# Patient Record
Sex: Female | Born: 1955
Health system: Southern US, Community
[De-identification: ages and names within clinical notes are randomized; demographics above are authoritative.]

## PROBLEM LIST (undated history)

## (undated) DIAGNOSIS — Z972 Presence of dental prosthetic device (complete) (partial): Secondary | ICD-10-CM

## (undated) DIAGNOSIS — I6521 Occlusion and stenosis of right carotid artery: Secondary | ICD-10-CM

## (undated) DIAGNOSIS — K635 Polyp of colon: Secondary | ICD-10-CM

## (undated) DIAGNOSIS — G5600 Carpal tunnel syndrome, unspecified upper limb: Secondary | ICD-10-CM

## (undated) DIAGNOSIS — K259 Gastric ulcer, unspecified as acute or chronic, without hemorrhage or perforation: Secondary | ICD-10-CM

## (undated) DIAGNOSIS — K219 Gastro-esophageal reflux disease without esophagitis: Secondary | ICD-10-CM

## (undated) DIAGNOSIS — R42 Dizziness and giddiness: Secondary | ICD-10-CM

## (undated) DIAGNOSIS — M109 Gout, unspecified: Secondary | ICD-10-CM

## (undated) DIAGNOSIS — C4491 Basal cell carcinoma of skin, unspecified: Secondary | ICD-10-CM

## (undated) DIAGNOSIS — M1990 Unspecified osteoarthritis, unspecified site: Secondary | ICD-10-CM

## (undated) DIAGNOSIS — E785 Hyperlipidemia, unspecified: Secondary | ICD-10-CM

## (undated) DIAGNOSIS — G4730 Sleep apnea, unspecified: Secondary | ICD-10-CM

## (undated) DIAGNOSIS — E559 Vitamin D deficiency, unspecified: Secondary | ICD-10-CM

## (undated) DIAGNOSIS — R911 Solitary pulmonary nodule: Secondary | ICD-10-CM

## (undated) DIAGNOSIS — G4733 Obstructive sleep apnea (adult) (pediatric): Secondary | ICD-10-CM

## (undated) DIAGNOSIS — R0600 Dyspnea, unspecified: Secondary | ICD-10-CM

## (undated) DIAGNOSIS — E611 Iron deficiency: Secondary | ICD-10-CM

## (undated) DIAGNOSIS — C801 Malignant (primary) neoplasm, unspecified: Secondary | ICD-10-CM

## (undated) DIAGNOSIS — D509 Iron deficiency anemia, unspecified: Secondary | ICD-10-CM

## (undated) DIAGNOSIS — I10 Essential (primary) hypertension: Secondary | ICD-10-CM

## (undated) DIAGNOSIS — J45909 Unspecified asthma, uncomplicated: Secondary | ICD-10-CM

## (undated) DIAGNOSIS — T884XXA Failed or difficult intubation, initial encounter: Secondary | ICD-10-CM

## (undated) HISTORY — PX: APPENDECTOMY: SHX54

## (undated) HISTORY — PX: LUMBAR DISC SURGERY: SHX700

## (undated) HISTORY — PX: OTHER SURGICAL HISTORY: SHX169

## (undated) HISTORY — PX: COLONOSCOPY: SHX174

## (undated) HISTORY — PX: BACK SURGERY: SHX140

## (undated) HISTORY — PX: ESOPHAGOGASTRODUODENOSCOPY: SHX1529

## (undated) HISTORY — PX: FUNCTIONAL ENDOSCOPIC SINUS SURGERY: SUR616

## (undated) HISTORY — PX: ABDOMINAL HYSTERECTOMY: SHX81

## (undated) HISTORY — PX: TONSILLECTOMY AND ADENOIDECTOMY: SUR1326

## (undated) HISTORY — PX: JOINT REPLACEMENT: SHX530

## (undated) HISTORY — PX: CHOLECYSTECTOMY: SHX55

## (undated) HISTORY — PX: EYE SURGERY: SHX253

## (undated) HISTORY — PX: CARDIAC CATHETERIZATION: SHX172

---

## 1998-04-30 DIAGNOSIS — Z90710 Acquired absence of both cervix and uterus: Secondary | ICD-10-CM | POA: Insufficient documentation

## 2002-11-30 HISTORY — PX: KNEE ARTHROSCOPY: SHX127

## 2003-02-23 LAB — HM PAP SMEAR: HM Pap smear: NORMAL

## 2004-03-15 ENCOUNTER — Ambulatory Visit: Payer: Self-pay | Admitting: Unknown Physician Specialty

## 2004-05-28 ENCOUNTER — Emergency Department: Payer: Self-pay | Admitting: Unknown Physician Specialty

## 2004-08-16 ENCOUNTER — Ambulatory Visit: Payer: Self-pay | Admitting: Unknown Physician Specialty

## 2005-05-30 ENCOUNTER — Ambulatory Visit: Payer: Self-pay | Admitting: Unknown Physician Specialty

## 2005-08-08 ENCOUNTER — Ambulatory Visit: Payer: Self-pay | Admitting: Unknown Physician Specialty

## 2006-06-04 ENCOUNTER — Ambulatory Visit: Payer: Self-pay | Admitting: Unknown Physician Specialty

## 2007-06-06 ENCOUNTER — Ambulatory Visit: Payer: Self-pay | Admitting: Unknown Physician Specialty

## 2007-10-17 ENCOUNTER — Ambulatory Visit: Payer: Self-pay | Admitting: Unknown Physician Specialty

## 2008-06-03 DIAGNOSIS — G4730 Sleep apnea, unspecified: Secondary | ICD-10-CM | POA: Insufficient documentation

## 2008-06-03 DIAGNOSIS — E668 Other obesity: Secondary | ICD-10-CM | POA: Insufficient documentation

## 2008-06-03 DIAGNOSIS — I10 Essential (primary) hypertension: Secondary | ICD-10-CM | POA: Insufficient documentation

## 2008-06-08 ENCOUNTER — Ambulatory Visit: Payer: Self-pay | Admitting: Unknown Physician Specialty

## 2008-06-26 DIAGNOSIS — Z8249 Family history of ischemic heart disease and other diseases of the circulatory system: Secondary | ICD-10-CM | POA: Insufficient documentation

## 2008-06-26 DIAGNOSIS — Z87891 Personal history of nicotine dependence: Secondary | ICD-10-CM | POA: Insufficient documentation

## 2008-11-03 DIAGNOSIS — E785 Hyperlipidemia, unspecified: Secondary | ICD-10-CM | POA: Insufficient documentation

## 2008-11-10 ENCOUNTER — Ambulatory Visit: Payer: Self-pay | Admitting: Family Medicine

## 2008-12-14 ENCOUNTER — Ambulatory Visit: Payer: Self-pay | Admitting: Surgery

## 2009-03-09 DIAGNOSIS — H8110 Benign paroxysmal vertigo, unspecified ear: Secondary | ICD-10-CM | POA: Insufficient documentation

## 2009-06-14 ENCOUNTER — Ambulatory Visit: Payer: Self-pay | Admitting: Unknown Physician Specialty

## 2010-03-01 ENCOUNTER — Ambulatory Visit: Payer: Self-pay

## 2010-06-19 ENCOUNTER — Ambulatory Visit: Payer: Self-pay | Admitting: Unknown Physician Specialty

## 2011-06-25 ENCOUNTER — Ambulatory Visit: Payer: Self-pay | Admitting: Unknown Physician Specialty

## 2012-06-20 ENCOUNTER — Ambulatory Visit: Payer: Self-pay | Admitting: Unknown Physician Specialty

## 2012-12-03 ENCOUNTER — Ambulatory Visit: Payer: Self-pay | Admitting: Internal Medicine

## 2012-12-10 LAB — CBC CANCER CENTER
Basophil #: 0.1 x10 3/mm (ref 0.0–0.1)
HGB: 12.8 g/dL (ref 12.0–16.0)
Lymphocyte #: 1.9 x10 3/mm (ref 1.0–3.6)
MCH: 27.9 pg (ref 26.0–34.0)
Monocyte #: 0.5 x10 3/mm (ref 0.2–0.9)
Monocyte %: 5.3 %
Neutrophil #: 6.6 x10 3/mm — ABNORMAL HIGH (ref 1.4–6.5)
RBC: 4.6 10*6/uL (ref 3.80–5.20)
RDW: 15.6 % — ABNORMAL HIGH (ref 11.5–14.5)
WBC: 9.8 x10 3/mm (ref 3.6–11.0)

## 2012-12-10 LAB — IRON AND TIBC
Iron Saturation: 17 %
Iron: 53 ug/dL (ref 50–170)
Unbound Iron-Bind.Cap.: 259 ug/dL

## 2012-12-10 LAB — FERRITIN: Ferritin (ARMC): 66 ng/mL (ref 8–388)

## 2012-12-29 ENCOUNTER — Ambulatory Visit: Payer: Self-pay | Admitting: Internal Medicine

## 2013-02-06 ENCOUNTER — Ambulatory Visit: Payer: Self-pay | Admitting: Internal Medicine

## 2013-02-27 LAB — CBC CANCER CENTER
Basophil #: 0.1 x10 3/mm (ref 0.0–0.1)
Eosinophil #: 0.7 x10 3/mm (ref 0.0–0.7)
Eosinophil %: 5.7 %
HCT: 40.9 % (ref 35.0–47.0)
Lymphocyte #: 2.7 x10 3/mm (ref 1.0–3.6)
MCHC: 32.8 g/dL (ref 32.0–36.0)
MCV: 83 fL (ref 80–100)
Monocyte #: 0.7 x10 3/mm (ref 0.2–0.9)
Monocyte %: 5.4 %
Neutrophil #: 8.3 x10 3/mm — ABNORMAL HIGH (ref 1.4–6.5)
Platelet: 298 x10 3/mm (ref 150–440)
RBC: 4.97 10*6/uL (ref 3.80–5.20)
WBC: 12.5 x10 3/mm — ABNORMAL HIGH (ref 3.6–11.0)

## 2013-02-28 ENCOUNTER — Ambulatory Visit: Payer: Self-pay | Admitting: Internal Medicine

## 2013-03-10 LAB — OCCULT BLOOD X 1 CARD TO LAB, STOOL: Occult Blood, Feces: NEGATIVE

## 2013-03-30 ENCOUNTER — Ambulatory Visit: Payer: Self-pay | Admitting: Internal Medicine

## 2013-04-27 LAB — CBC CANCER CENTER
Basophil #: 0.1 x10 3/mm (ref 0.0–0.1)
Basophil %: 1.2 %
Eosinophil #: 0.8 x10 3/mm — ABNORMAL HIGH (ref 0.0–0.7)
Lymphocyte #: 3 x10 3/mm (ref 1.0–3.6)
MCH: 26.9 pg (ref 26.0–34.0)
Monocyte #: 0.7 x10 3/mm (ref 0.2–0.9)
Neutrophil %: 64.2 %
Platelet: 307 x10 3/mm (ref 150–440)
RDW: 15.3 % — ABNORMAL HIGH (ref 11.5–14.5)
WBC: 12.8 x10 3/mm — ABNORMAL HIGH (ref 3.6–11.0)

## 2013-04-30 ENCOUNTER — Ambulatory Visit: Payer: Self-pay | Admitting: Internal Medicine

## 2013-07-09 ENCOUNTER — Ambulatory Visit: Payer: Self-pay | Admitting: Internal Medicine

## 2013-07-10 LAB — CBC CANCER CENTER
BASOS ABS: 0.1 x10 3/mm (ref 0.0–0.1)
BASOS PCT: 1.2 %
EOS PCT: 6.6 %
Eosinophil #: 0.7 x10 3/mm (ref 0.0–0.7)
HCT: 38.2 % (ref 35.0–47.0)
HGB: 12.5 g/dL (ref 12.0–16.0)
Lymphocyte #: 2.4 x10 3/mm (ref 1.0–3.6)
Lymphocyte %: 21.4 %
MCH: 27.1 pg (ref 26.0–34.0)
MCHC: 32.7 g/dL (ref 32.0–36.0)
MCV: 83 fL (ref 80–100)
Monocyte #: 0.6 x10 3/mm (ref 0.2–0.9)
Monocyte %: 5.4 %
Neutrophil #: 7.3 x10 3/mm — ABNORMAL HIGH (ref 1.4–6.5)
Neutrophil %: 65.4 %
Platelet: 283 x10 3/mm (ref 150–440)
RBC: 4.61 10*6/uL (ref 3.80–5.20)
RDW: 15.7 % — ABNORMAL HIGH (ref 11.5–14.5)
WBC: 11.2 x10 3/mm — ABNORMAL HIGH (ref 3.6–11.0)

## 2013-07-29 ENCOUNTER — Ambulatory Visit: Payer: Self-pay | Admitting: Internal Medicine

## 2013-08-24 LAB — OCCULT BLOOD X 1 CARD TO LAB, STOOL
OCCULT BLOOD, FECES: POSITIVE
Occult Blood, Feces: NEGATIVE
Occult Blood, Feces: POSITIVE

## 2013-08-24 LAB — CBC CANCER CENTER
BASOS ABS: 0.1 x10 3/mm (ref 0.0–0.1)
Basophil %: 0.7 %
EOS PCT: 4.2 %
Eosinophil #: 0.5 x10 3/mm (ref 0.0–0.7)
HCT: 41.2 % (ref 35.0–47.0)
HGB: 13.6 g/dL (ref 12.0–16.0)
Lymphocyte #: 2.5 x10 3/mm (ref 1.0–3.6)
Lymphocyte %: 23.3 %
MCH: 27.3 pg (ref 26.0–34.0)
MCHC: 32.9 g/dL (ref 32.0–36.0)
MCV: 83 fL (ref 80–100)
Monocyte #: 0.5 x10 3/mm (ref 0.2–0.9)
Monocyte %: 4.8 %
NEUTROS ABS: 7.2 x10 3/mm — AB (ref 1.4–6.5)
Neutrophil %: 67 %
PLATELETS: 293 x10 3/mm (ref 150–440)
RBC: 4.97 10*6/uL (ref 3.80–5.20)
RDW: 14.3 % (ref 11.5–14.5)
WBC: 10.8 x10 3/mm (ref 3.6–11.0)

## 2013-08-28 ENCOUNTER — Ambulatory Visit: Payer: Self-pay | Admitting: Internal Medicine

## 2013-10-02 ENCOUNTER — Ambulatory Visit: Payer: Self-pay | Admitting: Internal Medicine

## 2013-10-23 ENCOUNTER — Ambulatory Visit: Payer: Self-pay | Admitting: Unknown Physician Specialty

## 2013-11-02 LAB — PATHOLOGY REPORT

## 2013-11-04 ENCOUNTER — Ambulatory Visit: Payer: Self-pay | Admitting: Internal Medicine

## 2013-11-04 LAB — CBC CANCER CENTER
BASOS PCT: 0.9 %
Basophil #: 0.1 x10 3/mm (ref 0.0–0.1)
EOS PCT: 4.7 %
Eosinophil #: 0.5 x10 3/mm (ref 0.0–0.7)
HCT: 41.5 % (ref 35.0–47.0)
HGB: 13.9 g/dL (ref 12.0–16.0)
LYMPHS ABS: 2.8 x10 3/mm (ref 1.0–3.6)
Lymphocyte %: 25.4 %
MCH: 27.6 pg (ref 26.0–34.0)
MCHC: 33.5 g/dL (ref 32.0–36.0)
MCV: 83 fL (ref 80–100)
MONOS PCT: 6.5 %
Monocyte #: 0.7 x10 3/mm (ref 0.2–0.9)
NEUTROS ABS: 7 x10 3/mm — AB (ref 1.4–6.5)
Neutrophil %: 62.5 %
Platelet: 276 x10 3/mm (ref 150–440)
RBC: 5.03 10*6/uL (ref 3.80–5.20)
RDW: 15.2 % — ABNORMAL HIGH (ref 11.5–14.5)
WBC: 11.1 x10 3/mm — AB (ref 3.6–11.0)

## 2013-11-28 ENCOUNTER — Ambulatory Visit: Payer: Self-pay | Admitting: Internal Medicine

## 2013-12-22 LAB — HEPATIC FUNCTION PANEL: BILIRUBIN, TOTAL: 0.5 mg/dL

## 2013-12-22 LAB — TSH: TSH: 4.18 u[IU]/mL (ref <=5.90)

## 2014-06-07 DIAGNOSIS — M19049 Primary osteoarthritis, unspecified hand: Secondary | ICD-10-CM | POA: Insufficient documentation

## 2014-06-22 LAB — CBC AND DIFFERENTIAL
HEMATOCRIT: 42 % (ref 36–46)
Hemoglobin: 14.3 g/dL (ref 12.0–16.0)
PLATELETS: 346 10*3/uL (ref 150–399)

## 2014-06-22 LAB — LIPID PANEL
Cholesterol: 232 mg/dL — AB (ref 0–200)
HDL: 38 mg/dL (ref 35–70)
LDL CALC: 163 mg/dL
LDl/HDL Ratio: 4.3
Triglycerides: 155 mg/dL (ref 40–160)

## 2014-06-22 LAB — BASIC METABOLIC PANEL
Creatinine: 1.4 mg/dL — AB (ref 0.5–1.1)
GLUCOSE: 98 mg/dL

## 2014-12-23 ENCOUNTER — Other Ambulatory Visit: Payer: Self-pay | Admitting: Family Medicine

## 2015-01-19 ENCOUNTER — Ambulatory Visit (INDEPENDENT_AMBULATORY_CARE_PROVIDER_SITE_OTHER): Payer: BLUE CROSS/BLUE SHIELD | Admitting: Family Medicine

## 2015-01-19 ENCOUNTER — Encounter: Payer: Self-pay | Admitting: Family Medicine

## 2015-01-19 VITALS — BP 126/68 | HR 72 | Temp 98.3°F | Resp 16 | Ht 65.0 in | Wt 271.0 lb

## 2015-01-19 DIAGNOSIS — K229 Disease of esophagus, unspecified: Secondary | ICD-10-CM | POA: Insufficient documentation

## 2015-01-19 DIAGNOSIS — R0600 Dyspnea, unspecified: Secondary | ICD-10-CM | POA: Insufficient documentation

## 2015-01-19 DIAGNOSIS — E8881 Metabolic syndrome: Secondary | ICD-10-CM | POA: Insufficient documentation

## 2015-01-19 DIAGNOSIS — K259 Gastric ulcer, unspecified as acute or chronic, without hemorrhage or perforation: Secondary | ICD-10-CM | POA: Insufficient documentation

## 2015-01-19 DIAGNOSIS — D649 Anemia, unspecified: Secondary | ICD-10-CM | POA: Insufficient documentation

## 2015-01-19 DIAGNOSIS — K2100 Gastro-esophageal reflux disease with esophagitis, without bleeding: Secondary | ICD-10-CM | POA: Insufficient documentation

## 2015-01-19 DIAGNOSIS — J309 Allergic rhinitis, unspecified: Secondary | ICD-10-CM | POA: Insufficient documentation

## 2015-01-19 DIAGNOSIS — D509 Iron deficiency anemia, unspecified: Secondary | ICD-10-CM | POA: Insufficient documentation

## 2015-01-19 DIAGNOSIS — J321 Chronic frontal sinusitis: Secondary | ICD-10-CM | POA: Insufficient documentation

## 2015-01-19 DIAGNOSIS — E538 Deficiency of other specified B group vitamins: Secondary | ICD-10-CM | POA: Insufficient documentation

## 2015-01-19 DIAGNOSIS — R1011 Right upper quadrant pain: Secondary | ICD-10-CM

## 2015-01-19 DIAGNOSIS — D126 Benign neoplasm of colon, unspecified: Secondary | ICD-10-CM | POA: Insufficient documentation

## 2015-01-19 DIAGNOSIS — L509 Urticaria, unspecified: Secondary | ICD-10-CM | POA: Insufficient documentation

## 2015-01-19 DIAGNOSIS — I10 Essential (primary) hypertension: Secondary | ICD-10-CM | POA: Insufficient documentation

## 2015-01-19 DIAGNOSIS — K635 Polyp of colon: Secondary | ICD-10-CM | POA: Insufficient documentation

## 2015-01-19 DIAGNOSIS — S9000XA Contusion of unspecified ankle, initial encounter: Secondary | ICD-10-CM | POA: Insufficient documentation

## 2015-01-19 DIAGNOSIS — E559 Vitamin D deficiency, unspecified: Secondary | ICD-10-CM | POA: Insufficient documentation

## 2015-01-19 DIAGNOSIS — R609 Edema, unspecified: Secondary | ICD-10-CM | POA: Insufficient documentation

## 2015-01-19 DIAGNOSIS — N289 Disorder of kidney and ureter, unspecified: Secondary | ICD-10-CM | POA: Insufficient documentation

## 2015-01-19 DIAGNOSIS — M542 Cervicalgia: Secondary | ICD-10-CM | POA: Insufficient documentation

## 2015-01-19 MED ORDER — VALACYCLOVIR HCL 1 G PO TABS: 1000.0000 mg | ORAL_TABLET | Freq: Three times a day (TID) | ORAL | Status: DC

## 2015-01-19 NOTE — Progress Notes (Signed)
Patient ID: Sarah Marquez, female   DOB: 05/19/1955, 59 y.o.   MRN: [id number]       Patient: Sarah Marquez Female    DOB: 03-29-1956   59 y.o.   MRN: [id number] Visit Date: 01/19/2015  Today's Provider: Wilhemena Durie, MD   Chief Complaint  Patient presents with  . Abdominal Pain    X 3 days more in RUQ.    Subjective:    Abdominal Pain This is a new problem. The current episode started in the past 7 days. The onset quality is gradual. The problem occurs constantly. The problem has been gradually worsening. The pain is located in the RUQ. The pain is at a severity of 4/10. The pain is mild. The quality of the pain is burning and dull. The abdominal pain radiates to the back. Associated symptoms include diarrhea and nausea. Pertinent negatives include no fever, hematochezia or hematuria. Nothing aggravates the pain. The pain is relieved by nothing. She has tried antacids for the symptoms. The treatment provided no relief. Her past medical history is significant for abdominal surgery. Patient has had her gallbladder removed.    Patient reports that after she has had symptoms after Sunday dinner. Patient reports that initially she had diarrhea, but now she reports that it has resolved. Patient reports that it is a burning sensation in her RUQ that radiates to her back. Patient reports that she takes Dexilant on a daily basis and it usually helps with indigestion, but no so much this time.      Allergies  Allergen Reactions  . Beclomethasone   . Sulfa Antibiotics   . Latex Rash   Previous Medications   CHOLECALCIFEROL (VITAMIN D-1000 MAX ST) 1000 UNITS TABLET    Take by mouth.   FERROUS SULFATE 325 (65 FE) MG TABLET    Take by mouth.   MAGNESIUM 200 MG TABS    Take by mouth.   OMEPRAZOLE (PRILOSEC) 40 MG CAPSULE    TAKE 1 CAPSULE TWICE A DAY   VERAPAMIL (CALAN-SR) 240 MG CR TABLET    TAKE 1 TABLET BY MOUTH EVERY DAY    Review of Systems  Constitutional: Negative for fever.    HENT: Negative.   Eyes: Negative.   Gastrointestinal: Positive for nausea, abdominal pain and diarrhea. Negative for hematochezia.  Genitourinary: Negative.  Negative for hematuria.  Neurological: Negative.     Social History  Substance Use Topics  . Smoking status: Never Smoker   . Smokeless tobacco: Not on file  . Alcohol Use: No   Objective:   BP 126/68 mmHg  Pulse 72  Temp(Src) 98.3 F (36.8 C)  Resp 16  Ht 5\' 5"  (1.651 m)  Wt 271 lb (122.925 kg)  BMI 45.10 kg/m2  Physical Exam  Constitutional: She is oriented to person, place, and time. She appears well-developed and well-nourished.  HENT:  Head: Normocephalic and atraumatic.  Right Ear: External ear normal.  Left Ear: External ear normal.  Mouth/Throat: Oropharynx is clear and moist.  Eyes: Conjunctivae are normal.  Neck: Neck supple.  Cardiovascular: Normal rate and normal heart sounds.   Pulmonary/Chest: Effort normal and breath sounds normal.  Abdominal: Soft.  Neurological: She is alert and oriented to person, place, and time.  Skin: Skin is warm and dry.  Psychiatric: She has a normal mood and affect. Her behavior is normal. Judgment and thought content normal.        Assessment & Plan:     1. Right upper  quadrant pain  - CBC with Differential - Comprehensive Metabolic Panel (CMET) Pt s/p cholecystectomy. Could be shingles but cannot make dx without rash,RTC next week if still in pain.      Richard Cranford Mon, MD  [facility]

## 2015-01-20 LAB — CBC WITH DIFFERENTIAL/PLATELET
BASOS ABS: 0 10*3/uL (ref 0.0–0.2)
Basos: 0 %
EOS (ABSOLUTE): 0.6 10*3/uL — AB (ref 0.0–0.4)
Eos: 5 %
HEMOGLOBIN: 14.6 g/dL (ref 11.1–15.9)
Hematocrit: 44.1 % (ref 34.0–46.6)
IMMATURE GRANS (ABS): 0 10*3/uL (ref 0.0–0.1)
Immature Granulocytes: 0 %
LYMPHS: 26 %
Lymphocytes Absolute: 2.9 10*3/uL (ref 0.7–3.1)
MCH: 28.9 pg (ref 26.6–33.0)
MCHC: 33.1 g/dL (ref 31.5–35.7)
MCV: 87 fL (ref 79–97)
Monocytes Absolute: 0.9 10*3/uL (ref 0.1–0.9)
Monocytes: 8 %
Neutrophils Absolute: 7 10*3/uL (ref 1.4–7.0)
Neutrophils: 61 %
PLATELETS: 322 10*3/uL (ref 150–379)
RBC: 5.06 x10E6/uL (ref 3.77–5.28)
RDW: 14.4 % (ref 12.3–15.4)
WBC: 11.4 10*3/uL — AB (ref 3.4–10.8)

## 2015-01-20 LAB — COMPREHENSIVE METABOLIC PANEL
ALBUMIN: 4.1 g/dL (ref 3.6–4.8)
ALK PHOS: 110 IU/L (ref 39–117)
ALT: 13 IU/L (ref 0–32)
AST: 16 IU/L (ref 0–40)
Albumin/Globulin Ratio: 1.4 (ref 1.1–2.5)
BILIRUBIN TOTAL: 0.5 mg/dL (ref 0.0–1.2)
BUN / CREAT RATIO: 17 (ref 11–26)
BUN: 18 mg/dL (ref 8–27)
CHLORIDE: 98 mmol/L (ref 97–108)
CO2: 26 mmol/L (ref 18–29)
CREATININE: 1.06 mg/dL — AB (ref 0.57–1.00)
Calcium: 9.7 mg/dL (ref 8.7–10.3)
GFR calc Af Amer: 66 mL/min/{1.73_m2} (ref >59)
GFR calc non Af Amer: 57 mL/min/{1.73_m2} — ABNORMAL LOW (ref >59)
GLUCOSE: 88 mg/dL (ref 65–99)
Globulin, Total: 3 g/dL (ref 1.5–4.5)
Potassium: 4.8 mmol/L (ref 3.5–5.2)
Sodium: 140 mmol/L (ref 134–144)
TOTAL PROTEIN: 7.1 g/dL (ref 6.0–8.5)

## 2015-01-24 ENCOUNTER — Telehealth: Payer: Self-pay

## 2015-01-24 NOTE — Telephone Encounter (Signed)
-----   Message from Jerrol Banana., MD sent at 01/23/2015  9:06 AM EDT ----- Labs stable

## 2015-01-24 NOTE — Telephone Encounter (Signed)
Advised  ED 

## 2015-02-16 ENCOUNTER — Encounter: Payer: Self-pay | Admitting: Family Medicine

## 2015-02-25 ENCOUNTER — Other Ambulatory Visit: Payer: Self-pay

## 2015-02-25 MED ORDER — LOSARTAN POTASSIUM 50 MG PO TABS: 50.0000 mg | ORAL_TABLET | Freq: Every day | ORAL | Status: DC

## 2015-04-29 ENCOUNTER — Other Ambulatory Visit: Payer: Self-pay | Admitting: Family Medicine

## 2015-05-19 ENCOUNTER — Encounter: Payer: Self-pay | Admitting: Emergency Medicine

## 2015-06-20 ENCOUNTER — Other Ambulatory Visit: Payer: Self-pay | Admitting: Family Medicine

## 2015-07-05 ENCOUNTER — Other Ambulatory Visit: Payer: Self-pay | Admitting: Family Medicine

## 2015-07-25 ENCOUNTER — Telehealth: Payer: Self-pay

## 2015-07-25 NOTE — Telephone Encounter (Signed)
lmtcb-need to know date of her last mammogram to update her chart=aa

## 2015-07-26 NOTE — Telephone Encounter (Signed)
Pt returned call. Pt stated she had her last mammogram done last March and plans to contact [place] [facility] to schedule one for this year. Thanks TNP

## 2015-07-26 NOTE — Telephone Encounter (Signed)
Thanks updated in the chart-aa

## 2015-08-15 DIAGNOSIS — J208 Acute bronchitis due to other specified organisms: Secondary | ICD-10-CM | POA: Diagnosis not present

## 2015-08-23 DIAGNOSIS — Z01419 Encounter for gynecological examination (general) (routine) without abnormal findings: Secondary | ICD-10-CM | POA: Diagnosis not present

## 2015-08-23 DIAGNOSIS — Z1231 Encounter for screening mammogram for malignant neoplasm of breast: Secondary | ICD-10-CM | POA: Diagnosis not present

## 2015-08-28 DIAGNOSIS — J208 Acute bronchitis due to other specified organisms: Secondary | ICD-10-CM | POA: Diagnosis not present

## 2015-09-08 DIAGNOSIS — J209 Acute bronchitis, unspecified: Secondary | ICD-10-CM | POA: Diagnosis not present

## 2015-09-21 DIAGNOSIS — M170 Bilateral primary osteoarthritis of knee: Secondary | ICD-10-CM | POA: Diagnosis not present

## 2015-09-28 ENCOUNTER — Encounter: Payer: Self-pay | Admitting: Family Medicine

## 2015-10-06 ENCOUNTER — Ambulatory Visit (INDEPENDENT_AMBULATORY_CARE_PROVIDER_SITE_OTHER): Payer: BLUE CROSS/BLUE SHIELD | Admitting: Family Medicine

## 2015-10-06 ENCOUNTER — Encounter: Payer: Self-pay | Admitting: Family Medicine

## 2015-10-06 VITALS — BP 124/70 | HR 92 | Temp 97.6°F | Resp 20 | Wt 287.0 lb

## 2015-10-06 DIAGNOSIS — D509 Iron deficiency anemia, unspecified: Secondary | ICD-10-CM

## 2015-10-06 DIAGNOSIS — E538 Deficiency of other specified B group vitamins: Secondary | ICD-10-CM

## 2015-10-06 DIAGNOSIS — I10 Essential (primary) hypertension: Secondary | ICD-10-CM | POA: Diagnosis not present

## 2015-10-06 DIAGNOSIS — E785 Hyperlipidemia, unspecified: Secondary | ICD-10-CM

## 2015-10-06 DIAGNOSIS — L03119 Cellulitis of unspecified part of limb: Secondary | ICD-10-CM

## 2015-10-06 MED ORDER — AMOXICILLIN 500 MG PO CAPS: 500.0000 mg | ORAL_CAPSULE | Freq: Three times a day (TID) | ORAL | Status: DC

## 2015-10-06 NOTE — Progress Notes (Signed)
Subjective:    Patient ID: Sarah Marquez, female    DOB: 03/26/1956, 60 y.o.   MRN: [id number]  HPI Cellulitis: Patient complains of cellulitis. The location of the cellulitis is lower leg(s) bilateral. Onset of symptoms was 1 month ago, waxing and waning since that time. Pt has noticed the problem gradually worsening over the last week. Associated sx include redness, edema, burning and itching sensation.    Review of Systems  Constitutional: Positive for unexpected weight change (gain). Negative for fever, chills, diaphoresis, activity change, appetite change and fatigue.  HENT: Negative.   Eyes: Negative.   Respiratory: Positive for cough. Negative for shortness of breath and wheezing.   Cardiovascular: Positive for leg swelling. Negative for chest pain and palpitations.  Skin:       Erythema present on bilateral lower extremities.  Allergic/Immunologic: Negative.   Neurological: Negative.   Hematological: Negative.    BP 124/70 mmHg  Pulse 92  Temp(Src) 97.6 F (36.4 C) (Oral)  Resp 20  Wt 287 lb (130.182 kg)   Patient Active Problem List   Diagnosis Date Noted  . Benign neoplasm of colon 01/19/2015  . Allergic rhinitis 01/19/2015  . Absolute anemia 01/19/2015  . Colon polyp 01/19/2015  . Ankle bruise 01/19/2015  . Dysmetabolic syndrome X [id number]  . Disorder of esophagus 01/19/2015  . Edema 01/19/2015  . Esophagitis, reflux 01/19/2015  . Frontal sinusitis 01/19/2015  . BP (high blood pressure) 01/19/2015  . Anemia, iron deficiency 01/19/2015  . Iron deficiency anemia 01/19/2015  . Abnormal kidney function 01/19/2015  . Cervical pain 01/19/2015  . Dyspnea, paroxysmal nocturnal 01/19/2015  . Gastric ulcer 01/19/2015  . Hive 01/19/2015  . B12 deficiency 01/19/2015  . Vitamin D deficiency 01/19/2015  . Degenerative arthritis of finger 06/07/2014  . Benign paroxysmal positional nystagmus 03/09/2009  . HLD (hyperlipidemia) 11/03/2008  . Fam hx-ischem heart  disease 06/26/2008  . History of tobacco use 06/26/2008  . Essential (primary) hypertension 06/03/2008  . Extreme obesity ([place]) 06/03/2008  . Apnea, sleep 06/03/2008  . Morbid obesity ([place]) 06/03/2008  . H/O total hysterectomy 04/30/1998   No past medical history on file. Current Outpatient Prescriptions on File Prior to Visit  Medication Sig  . Cholecalciferol (VITAMIN D-1000 MAX ST) 1000 UNITS tablet Take by mouth.  . DEXILANT 60 MG capsule Take 1 capsule by mouth daily.  . ferrous sulfate 325 (65 FE) MG tablet TAKE 1 TABLET EVERY DAY  . losartan (COZAAR) 50 MG tablet TAKE 1 TABLET BY MOUTH DAILY  . Magnesium 200 MG TABS Take by mouth.  . verapamil (CALAN-SR) 240 MG CR tablet TAKE 1 TABLET BY MOUTH EVERY DAY   No current facility-administered medications on file prior to visit.   Allergies  Allergen Reactions  . Beclomethasone   . Sulfa Antibiotics   . Latex Rash  . Meloxicam Palpitations   Past Surgical History  Procedure Laterality Date  . Appendectomy    . Abdominal hysterectomy    . Lumbar disc surgery      ruptured disc x 2  . Tonsillectomy and adenoidectomy    . Cardiac catheterization     Social History   Social History  . Marital Status: Married    Spouse Name: N/A  . Number of Children: N/A  . Years of Education: N/A   Occupational History  . Not on file.   Social History Main Topics  . Smoking status: Never Smoker   . Smokeless tobacco: Not on file  .  Alcohol Use: No  . Drug Use: No  . Sexual Activity: Not on file   Other Topics Concern  . Not on file   Social History Narrative   Family History  Problem Relation Age of Onset  . Breast cancer Mother   . Heart attack Father   . Heart attack Brother       Objective:   Physical Exam  Constitutional: She is oriented to person, place, and time. She appears well-developed and well-nourished.  HENT:  Head: Normocephalic and atraumatic.  Right Ear: External ear normal.  Left Ear: External ear  normal.  Nose: Nose normal.  Eyes: Conjunctivae are normal.  Neck: Neck supple.  Cardiovascular: Normal rate, regular rhythm and normal heart sounds.   Pulmonary/Chest: Effort normal and breath sounds normal.  Abdominal: Soft.  Neurological: She is alert and oriented to person, place, and time.  Skin: Skin is warm and dry.  Psychiatric: She has a normal mood and affect. Her behavior is normal. Judgment and thought content normal.  Edema both lower extremities, right greater than left. There is erythematous of the right lower extremity of the foot and ankle  With erythema without induration of lower half of right lower leg.        Assessment & Plan:  1. Cellulitis of lower extremity, unspecified laterality Right leg - amoxicillin (AMOXIL) 500 MG capsule; Take 1 capsule (500 mg total) by mouth 3 (three) times daily.  Dispense: 28 capsule; Refill: 0More than 50% of this visit spent in counseling regarding these issues.  2. Essential (primary) hypertension  - CBC with Differential/Platelet - TSH  3. B12 deficiency   4. Anemia, iron deficiency   5. HLD (hyperlipidemia)  - Lipid Panel With LDL/HDL Ratio - Comprehensive metabolic panel 6.Morbid Obesity

## 2015-10-07 LAB — COMPREHENSIVE METABOLIC PANEL
ALK PHOS: 116 IU/L (ref 39–117)
ALT: 12 IU/L (ref 0–32)
AST: 15 IU/L (ref 0–40)
Albumin/Globulin Ratio: 1.4 (ref 1.2–2.2)
Albumin: 3.8 g/dL (ref 3.6–4.8)
BUN/Creatinine Ratio: 20 (ref 12–28)
BUN: 18 mg/dL (ref 8–27)
Bilirubin Total: 0.6 mg/dL (ref 0.0–1.2)
CALCIUM: 9.3 mg/dL (ref 8.7–10.3)
CO2: 25 mmol/L (ref 18–29)
CREATININE: 0.9 mg/dL (ref 0.57–1.00)
Chloride: 101 mmol/L (ref 96–106)
GFR calc Af Amer: 80 mL/min/{1.73_m2} (ref >59)
GFR, EST NON AFRICAN AMERICAN: 69 mL/min/{1.73_m2} (ref >59)
GLOBULIN, TOTAL: 2.7 g/dL (ref 1.5–4.5)
Glucose: 95 mg/dL (ref 65–99)
Potassium: 4.6 mmol/L (ref 3.5–5.2)
SODIUM: 142 mmol/L (ref 134–144)
Total Protein: 6.5 g/dL (ref 6.0–8.5)

## 2015-10-07 LAB — CBC WITH DIFFERENTIAL/PLATELET
BASOS: 0 %
Basophils Absolute: 0 10*3/uL (ref 0.0–0.2)
EOS (ABSOLUTE): 0.6 10*3/uL — ABNORMAL HIGH (ref 0.0–0.4)
EOS: 7 %
HEMATOCRIT: 40.7 % (ref 34.0–46.6)
Hemoglobin: 13.7 g/dL (ref 11.1–15.9)
IMMATURE GRANS (ABS): 0 10*3/uL (ref 0.0–0.1)
IMMATURE GRANULOCYTES: 0 %
LYMPHS: 23 %
Lymphocytes Absolute: 2 10*3/uL (ref 0.7–3.1)
MCH: 28.7 pg (ref 26.6–33.0)
MCHC: 33.7 g/dL (ref 31.5–35.7)
MCV: 85 fL (ref 79–97)
MONOCYTES: 6 %
Monocytes Absolute: 0.5 10*3/uL (ref 0.1–0.9)
NEUTROS PCT: 64 %
Neutrophils Absolute: 5.7 10*3/uL (ref 1.4–7.0)
Platelets: 284 10*3/uL (ref 150–379)
RBC: 4.77 x10E6/uL (ref 3.77–5.28)
RDW: 14.5 % (ref 12.3–15.4)
WBC: 8.8 10*3/uL (ref 3.4–10.8)

## 2015-10-07 LAB — LIPID PANEL WITH LDL/HDL RATIO
Cholesterol, Total: 210 mg/dL — ABNORMAL HIGH (ref 100–199)
HDL: 32 mg/dL — AB (ref >39)
LDL Calculated: 145 mg/dL — ABNORMAL HIGH (ref 0–99)
LDL/HDL RATIO: 4.5 ratio — AB (ref 0.0–3.2)
TRIGLYCERIDES: 163 mg/dL — AB (ref 0–149)
VLDL CHOLESTEROL CAL: 33 mg/dL (ref 5–40)

## 2015-10-07 LAB — TSH: TSH: 2.37 u[IU]/mL (ref 0.450–4.500)

## 2015-11-08 ENCOUNTER — Other Ambulatory Visit: Payer: Self-pay | Admitting: Unknown Physician Specialty

## 2015-11-08 ENCOUNTER — Ambulatory Visit
Admission: RE | Admit: 2015-11-08 | Discharge: 2015-11-08 | Disposition: A | Discharge status: Home / Self Care | Payer: BLUE CROSS/BLUE SHIELD | Source: Ambulatory Visit | Attending: Unknown Physician Specialty | Admitting: Unknown Physician Specialty

## 2015-11-08 DIAGNOSIS — J849 Interstitial pulmonary disease, unspecified: Secondary | ICD-10-CM | POA: Diagnosis not present

## 2015-11-08 DIAGNOSIS — R0600 Dyspnea, unspecified: Secondary | ICD-10-CM | POA: Diagnosis not present

## 2015-11-08 DIAGNOSIS — R059 Cough, unspecified: Secondary | ICD-10-CM

## 2015-11-08 DIAGNOSIS — R05 Cough: Secondary | ICD-10-CM

## 2015-11-08 DIAGNOSIS — J45991 Cough variant asthma: Secondary | ICD-10-CM | POA: Diagnosis not present

## 2015-11-10 ENCOUNTER — Encounter: Payer: Self-pay | Admitting: Internal Medicine

## 2015-11-10 ENCOUNTER — Ambulatory Visit (INDEPENDENT_AMBULATORY_CARE_PROVIDER_SITE_OTHER): Payer: BLUE CROSS/BLUE SHIELD | Admitting: Internal Medicine

## 2015-11-10 VITALS — BP 126/68 | HR 67 | Ht 65.0 in | Wt 287.0 lb

## 2015-11-10 DIAGNOSIS — J069 Acute upper respiratory infection, unspecified: Secondary | ICD-10-CM | POA: Diagnosis not present

## 2015-11-10 DIAGNOSIS — G4733 Obstructive sleep apnea (adult) (pediatric): Secondary | ICD-10-CM | POA: Insufficient documentation

## 2015-11-10 DIAGNOSIS — R059 Cough, unspecified: Secondary | ICD-10-CM

## 2015-11-10 DIAGNOSIS — R0600 Dyspnea, unspecified: Secondary | ICD-10-CM

## 2015-11-10 DIAGNOSIS — R05 Cough: Secondary | ICD-10-CM

## 2015-11-10 MED ORDER — MONTELUKAST SODIUM 10 MG PO TABS: 10.0000 mg | ORAL_TABLET | Freq: Every day | ORAL | Status: DC

## 2015-11-10 NOTE — Progress Notes (Signed)
[facility] Consultation    Date: 11/10/2015  MRN# [id number] Sarah Marquez 1955/07/14  Referring Physician: Dr. Tami Ribas PMD - Dr. Rosanna Randy Sarah Marquez is  60 y.o. old female seen in consultation for chronic cough along with abnormal chest x-ray findings.  CC:  Chief Complaint  Patient presents with  . pulmonary consult    pt ref by Dr. Tami Ribas. pt states she seen Dr. Tami Ribas 08/2015 for cough started abx, prednisone & tessalon cough improved for 2 wk. currently has non prod cough, wheezing, sob w/exertion X61mo.     HPI:  60 year old female referred from ENT for evaluation of cough. Cough is described as felt mostly in the chest, has episodes of bronchitis, most recently in March. Has had similar episodes in the last year, had negative allergy testing along with close follow-up by ENT. Given the negative allergy testing her Allegra was stopped. Follows with ENT for chronic sinusitis. Cough is described as nonproductive, mild intermittent nighttime waking's associated with it, along with mild shortness of breath. States that she does also experience shortness of breath with exertion, can walk about 50 yards before eating to take a break. Review of records show that patient has follow-up with ENT most recently on 11/08/2015; who noted that cough is been present for the 2 weeks prior, associated with wheezing and shortness of breath cough developed gradually, has had interventions with pro-air sick contacts include husband, plan at that time was initiation of Qvar 80 g 2 puffs twice a day along with rescue inhaler and a chest x-ray. Chest x-ray showed no acute abnormalities in stable mild chronic interstitial lung disease, necessitating referral to pulmonary. 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 Previously was a social smoker as a teenager, he does have secondhand exposure from her  family, social drinker. Line review of records that show on 09/08/2015 patient didn't follow-up with ENT due to a acute bronchitis episode, was given prednisone Dosepak along with Levaquin and albuterol inhaler. At that time she was experiencing cough with sputum production and shortness of breath. She also with a known history of obstructive sleep apnea, diagnosed about 15 years ago, used to machine at that time, but gradually stop using it. Patient states that about 15 years that she's actually use her machine consistently. Could not recall what her CPAP severity or CPAP machine settings were.  PMHX:   History reviewed. No pertinent past medical history. Surgical Hx:  Past Surgical History  Procedure Laterality Date  . Appendectomy    . Abdominal hysterectomy    . Lumbar disc surgery      ruptured disc x 2  . Tonsillectomy and adenoidectomy    . Cardiac catheterization     Family Hx:  Family History  Problem Relation Age of Onset  . Breast cancer Mother   . Heart attack Father   . Heart attack Brother    Social Hx:   Social History  Substance Use Topics  . Smoking status: Never Smoker   . Smokeless tobacco: None  . Alcohol Use: No   Medication:   Current Outpatient Rx  Name  Route  Sig  Dispense  Refill  . albuterol (PROAIR HFA) 108 (90 Base) MCG/ACT inhaler   Inhalation   Inhale into the lungs.         Marland Kitchen amoxicillin (AMOXIL) 500 MG capsule   Oral   Take 1 capsule (500 mg total) by mouth 3 (  three) times daily.   28 capsule   0   . benzonatate (TESSALON) 200 MG capsule   Oral   Take by mouth.         . Cholecalciferol (VITAMIN D-1000 MAX ST) 1000 UNITS tablet   Oral   Take by mouth.         . DEXILANT 60 MG capsule   Oral   Take 1 capsule by mouth daily.      11     Dispense as written.   . ferrous sulfate 325 (65 FE) MG tablet      TAKE 1 TABLET EVERY DAY   90 tablet   3   . losartan (COZAAR) 50 MG tablet      TAKE 1 TABLET BY MOUTH DAILY    30 tablet   9   . Magnesium 200 MG TABS   Oral   Take by mouth.         . verapamil (CALAN-SR) 240 MG CR tablet      TAKE 1 TABLET BY MOUTH EVERY DAY   90 tablet   3       Allergies:  Beclomethasone; Sulfa antibiotics; Latex; and Meloxicam  ROS   Physical Examination:   VS: BP 126/68 mmHg  Pulse 67  Ht 5\' 5"  (1.651 m)  Wt 287 lb (130.182 kg)  BMI 47.76 kg/m2  SpO2 98%  General Appearance: No distress  Neuro:without focal findings, mental status, speech normal, alert and oriented, cranial nerves 2-12 intact, reflexes normal and symmetric, sensation grossly normal  HEENT: PERRLA, EOM intact, no ptosis, no other lesions noticed; Mallampati 3 Pulmonary: normal breath sounds., diaphragmatic excursion normal.No wheezing, No rales;   Sputum Production:  none CardiovascularNormal S1,S2.  No m/r/g.  Abdominal aorta pulsation normal.    Abdomen: Benign, Soft, non-tender, No masses, hepatosplenomegaly, No lymphadenopathy Renal:  No costovertebral tenderness  GU:  No performed at this time. Endoc: No evident thyromegaly, no signs of acromegaly or Cushing features Skin:   warm, no rashes, no ecchymosis  Extremities: normal, no cyanosis, clubbing, no edema, warm with normal capillary refill. Other findings:none   Labs results:   Rad results: (The following images and results were reviewed by Dr. Stevenson Clinch on 11/10/2015). CXR 11/08/15 CLINICAL DATA: Wheezing and dyspnea for the past 2 months.  EXAM: CHEST 2 VIEW  COMPARISON: 03/01/2010.  FINDINGS: A poor inspiration is again demonstrated. The heart remains grossly normal in size. The interstitial markings remain mildly prominent. Otherwise, clear lungs with normal vascularity. Mild thoracic spine degenerative changes. Cholecystectomy clips.  IMPRESSION: No acute abnormality. Stable mild chronic interstitial lung disease.   Assessment and Plan:45 -year-old female past medical history of chronic sinusitis, sleep  apnea, now seen in consultation for chronic cough/shortness of breath. OSA (obstructive sleep apnea) Patient with known history of sleep apnea, no use of machine in the past 15 years.  Plan: Split night study  Recurrent URI (upper respiratory infection) Patient with recurrent upper respiratory tract infections. She does have a history of chronic sinusitis, followed closely by ENT, recently treated with prednisone and Levaquin. At this time optimization of possibly adult-onset asthma has been initiated by ENT with initiation of Qvar. Given that she has allergic type symptoms, we'll start her on Singulair. Blood allergy testing was negative and her Allegra was stopped. However, in certain patients with predisposition to asthma, singular can be helpful.  Plan: -Pulmonary function testing and 6 minute walk test prior to follow-up visit -Continue with Qvar, 80 g,  2 puffs twice a day, -Continue pro-air as needed -Singular-10 mg 1 tab by mouth daily   Morbid obesity ([place]) OBESITY  Discussed importance of weight reduction.  Educated regarding limitation of  intake of greasy/fried foods.  Instructed on benefit of  a low-impact exercise program, starting slowly.  Discussed benefits of 30-45 minutes of some form of exercise daily as well as benefit of supervised exercise program.     Cough Differential includes: Chronic sinusitis, uncontrolled sleep apnea, adult-onset asthma, possible non-eosinophilic allergic asthma  Start singular 10 mg 1 tab by mouth daily Pulmonary function testing Split-night study  Dyspnea Patient with dyspnea. At this time is multifactorial morbid obesity, uncontrolled sleep apnea, deconditioning. I have reviewed her chest x-ray there is some mild interstitial lung changes that are noted, however classified this is chronic interstitial lung diseases difficult at this time. She does not have any significant exposure to 8 dust, exotic birds, other factors. I  suspect the chest x-ray findings are a sequelae of her recent recurrent bronchitis episodes. We'll optimize OSA, evaluate for asthma, and if after these measures, there is still significant dyspnea, will further evaluate lung architecture with high-resolution CAT scan.  Plan: -Optimize OSA treatment with split-night study -Singulair 10 mg daily -Weight loss, diet, exercise -Pulmonary function testing and 6 minute walk test.    Updated Medication List Outpatient Encounter Prescriptions as of 11/10/2015  Medication Sig  . albuterol (PROAIR HFA) 108 (90 Base) MCG/ACT inhaler Inhale into the lungs.  Marland Kitchen amoxicillin (AMOXIL) 500 MG capsule Take 1 capsule (500 mg total) by mouth 3 (three) times daily.  . benzonatate (TESSALON) 200 MG capsule Take by mouth.  . Cholecalciferol (VITAMIN D-1000 MAX ST) 1000 UNITS tablet Take by mouth.  . DEXILANT 60 MG capsule Take 1 capsule by mouth daily.  . ferrous sulfate 325 (65 FE) MG tablet TAKE 1 TABLET EVERY DAY  . losartan (COZAAR) 50 MG tablet TAKE 1 TABLET BY MOUTH DAILY  . Magnesium 200 MG TABS Take by mouth.  . verapamil (CALAN-SR) 240 MG CR tablet TAKE 1 TABLET BY MOUTH EVERY DAY   No facility-administered encounter medications on file as of 11/10/2015.    Orders for this visit: Orders Placed This Encounter  Procedures  . Pulmonary function test    Standing Status: Future     Number of Occurrences:      Standing Expiration Date: 11/09/2016    Order Specific Question:  Where should this test be performed?    Answer:  [facility]    Order Specific Question:  Full PFT: includes the following: basic spirometry, spirometry pre & post bronchodilator, diffusion capacity (DLCO), lung volumes    Answer:  Full PFT  . 6 minute walk    Standing Status: Future     Number of Occurrences:      Standing Expiration Date: 11/09/2016    Order Specific Question:  Where should this test be performed?    Answer:  Other  . Split night study    Standing  Status: Future     Number of Occurrences:      Standing Expiration Date: 11/09/2016    Order Specific Question:  Where should this test be performed:    Answer:  Other     Thank  you for the consultation and for allowing [facility] to assist in the care of your patient. Our recommendations are noted above.  Please contact us if we can be of further service.   Tilla Wilborn,  MD [facility] Office Number: [phone]  Note: This note was prepared with Dragon dictation along with smaller phrase technology. Any transcriptional errors that result from this process are unintentional.

## 2015-11-10 NOTE — Patient Instructions (Signed)
Follow up with Dr. Stevenson Clinch in 4-6 weeks - pulmonary function testing and 6 minute walk test prior to follow up - split night study - cont with Qvar  - cont with Proair as needed - we will start you on singulair - 10mg , 1 tab po daily

## 2015-11-14 DIAGNOSIS — R0600 Dyspnea, unspecified: Secondary | ICD-10-CM | POA: Insufficient documentation

## 2015-11-14 NOTE — Assessment & Plan Note (Signed)
Patient with dyspnea. At this time is multifactorial morbid obesity, uncontrolled sleep apnea, deconditioning. I have reviewed her chest x-ray there is some mild interstitial lung changes that are noted, however classified this is chronic interstitial lung diseases difficult at this time. She does not have any significant exposure to 8 dust, exotic birds, other factors. I suspect the chest x-ray findings are a sequelae of her recent recurrent bronchitis episodes. We'll optimize OSA, evaluate for asthma, and if after these measures, there is still significant dyspnea, will further evaluate lung architecture with high-resolution CAT scan.  Plan: -Optimize OSA treatment with split-night study -Singulair 10 mg daily -Weight loss, diet, exercise -Pulmonary function testing and 6 minute walk test.

## 2015-11-14 NOTE — Assessment & Plan Note (Signed)
Differential includes: Chronic sinusitis, uncontrolled sleep apnea, adult-onset asthma, possible non-eosinophilic allergic asthma  Start singular 10 mg 1 tab by mouth daily Pulmonary function testing Split-night study

## 2015-11-14 NOTE — Assessment & Plan Note (Signed)
Patient with known history of sleep apnea, no use of machine in the past 15 years.  Plan: Split night study

## 2015-11-14 NOTE — Assessment & Plan Note (Signed)
OBESITY  Discussed importance of weight reduction.  Educated regarding limitation of  intake of greasy/fried foods.  Instructed on benefit of  a low-impact exercise program, starting slowly.  Discussed benefits of 30-45 minutes of some form of exercise daily as well as benefit of supervised exercise program.    

## 2015-11-14 NOTE — Assessment & Plan Note (Signed)
Patient with recurrent upper respiratory tract infections. She does have a history of chronic sinusitis, followed closely by ENT, recently treated with prednisone and Levaquin. At this time optimization of possibly adult-onset asthma has been initiated by ENT with initiation of Qvar. Given that she has allergic type symptoms, we'll start her on Singulair. Blood allergy testing was negative and her Allegra was stopped. However, in certain patients with predisposition to asthma, singular can be helpful.  Plan: -Pulmonary function testing and 6 minute walk test prior to follow-up visit -Continue with Qvar, 80 g, 2 puffs twice a day, -Continue pro-air as needed -Singular-10 mg 1 tab by mouth daily

## 2015-11-25 ENCOUNTER — Ambulatory Visit: Payer: BLUE CROSS/BLUE SHIELD | Attending: Pulmonary Disease

## 2015-11-25 DIAGNOSIS — G4733 Obstructive sleep apnea (adult) (pediatric): Secondary | ICD-10-CM | POA: Diagnosis not present

## 2015-12-13 ENCOUNTER — Ambulatory Visit (INDEPENDENT_AMBULATORY_CARE_PROVIDER_SITE_OTHER): Payer: BLUE CROSS/BLUE SHIELD | Admitting: *Deleted

## 2015-12-13 DIAGNOSIS — R05 Cough: Secondary | ICD-10-CM

## 2015-12-13 DIAGNOSIS — J069 Acute upper respiratory infection, unspecified: Secondary | ICD-10-CM | POA: Diagnosis not present

## 2015-12-13 DIAGNOSIS — G4730 Sleep apnea, unspecified: Secondary | ICD-10-CM | POA: Diagnosis not present

## 2015-12-13 DIAGNOSIS — R059 Cough, unspecified: Secondary | ICD-10-CM

## 2015-12-13 LAB — PULMONARY FUNCTION TEST
DL/VA % PRED: 119 %
DL/VA: 5.88 ml/min/mmHg/L
DLCO UNC: 21.71 ml/min/mmHg
DLCO unc % pred: 84 %
FEF 25-75 POST: 3.2 L/s
FEF 25-75 Pre: 2.94 L/sec
FEF2575-%CHANGE-POST: 8 %
FEF2575-%PRED-PRE: 126 %
FEF2575-%Pred-Post: 137 %
FEV1-%Change-Post: 0 %
FEV1-%PRED-POST: 76 %
FEV1-%PRED-PRE: 77 %
FEV1-PRE: 2 L
FEV1-Post: 2 L
FEV1FVC-%CHANGE-POST: 2 %
FEV1FVC-%Pred-Pre: 114 %
FEV6-%CHANGE-POST: -2 %
FEV6-%Pred-Post: 67 %
FEV6-%Pred-Pre: 69 %
FEV6-Post: 2.19 L
FEV6-Pre: 2.25 L
FEV6FVC-%Pred-Post: 104 %
FEV6FVC-%Pred-Pre: 104 %
FVC-%Change-Post: -2 %
FVC-%Pred-Post: 64 %
FVC-%Pred-Pre: 66 %
FVC-Post: 2.19 L
FVC-Pre: 2.25 L
POST FEV1/FVC RATIO: 91 %
PRE FEV1/FVC RATIO: 89 %
Post FEV6/FVC ratio: 100 %
Pre FEV6/FVC Ratio: 100 %

## 2015-12-13 NOTE — Progress Notes (Signed)
PFT performed today with nitrogen washout. 

## 2015-12-13 NOTE — Progress Notes (Signed)
SMW performed today. 

## 2015-12-22 ENCOUNTER — Telehealth: Payer: Self-pay | Admitting: *Deleted

## 2015-12-22 DIAGNOSIS — G4733 Obstructive sleep apnea (adult) (pediatric): Secondary | ICD-10-CM

## 2015-12-22 NOTE — Telephone Encounter (Signed)
multiple attempts to speak with pt and her trying to call me back. I LMOM for pt letting her know that I am placing the order for her CPAP and to call back if she hasn't heard from them in 1 week. Also stated to call me back to let me know that she got the message.

## 2015-12-23 ENCOUNTER — Ambulatory Visit: Payer: BLUE CROSS/BLUE SHIELD

## 2015-12-27 ENCOUNTER — Encounter: Payer: Self-pay | Admitting: Internal Medicine

## 2015-12-27 ENCOUNTER — Ambulatory Visit (INDEPENDENT_AMBULATORY_CARE_PROVIDER_SITE_OTHER): Payer: BLUE CROSS/BLUE SHIELD | Admitting: Internal Medicine

## 2015-12-27 ENCOUNTER — Ambulatory Visit: Payer: BLUE CROSS/BLUE SHIELD | Admitting: Internal Medicine

## 2015-12-27 VITALS — BP 134/70 | HR 74 | Ht 65.0 in | Wt 287.0 lb

## 2015-12-27 DIAGNOSIS — R05 Cough: Secondary | ICD-10-CM | POA: Diagnosis not present

## 2015-12-27 DIAGNOSIS — R059 Cough, unspecified: Secondary | ICD-10-CM

## 2015-12-27 DIAGNOSIS — J45991 Cough variant asthma: Secondary | ICD-10-CM

## 2015-12-27 DIAGNOSIS — G4733 Obstructive sleep apnea (adult) (pediatric): Secondary | ICD-10-CM | POA: Diagnosis not present

## 2015-12-27 NOTE — Progress Notes (Signed)
[facility] Consultation      MRN# [id number] Sarah Marquez Aug 03, 1955   CC: Chief Complaint  Patient presents with  . Follow-up    review 74mw and pft.  Pt has not yet received her cpap machine- is supposed to be receiving this week.       Brief History: 60 yo with chronic cough after "bronchitis" in 06/2015, started on Qvar by ENT   Events since last clinic visit:  She presents today for follow-up visit of her OSA results and chronic cough. At her last visit she was continued on Qvar 80 g 2 puffs twice a day started on Singulair, and had a sleep study done. She also had a primary function testing and 6 minute walk test done. Today she does not endorse any coughing significant shortness of breath, fever, chills, nausea, vomiting, diarrhea. Overall she states that her cough is pretty much resolved since being on Qvar and singular. She believes that the majority of her cough occurs during late winter and early spring. She does endorse bilateral leg swelling at the end of the day after walking.    Current Outpatient Prescriptions:  .  albuterol (PROAIR HFA) 108 (90 Base) MCG/ACT inhaler, Inhale into the lungs., Disp: , Rfl:  .  beclomethasone (QVAR) 80 MCG/ACT inhaler, Inhale 2 puffs into the lungs 2 (two) times daily., Disp: , Rfl:  .  Cholecalciferol (VITAMIN D-1000 MAX ST) 1000 UNITS tablet, Take by mouth., Disp: , Rfl:  .  DEXILANT 60 MG capsule, Take 1 capsule by mouth daily., Disp: , Rfl: 11 .  ferrous sulfate 325 (65 FE) MG tablet, TAKE 1 TABLET EVERY DAY, Disp: 90 tablet, Rfl: 3 .  losartan (COZAAR) 50 MG tablet, TAKE 1 TABLET BY MOUTH DAILY, Disp: 30 tablet, Rfl: 9 .  Magnesium 200 MG TABS, Take by mouth., Disp: , Rfl:  .  montelukast (SINGULAIR) 10 MG tablet, Take 1 tablet (10 mg total) by mouth daily., Disp: 30 tablet, Rfl: 2 .  verapamil (CALAN-SR) 240 MG CR tablet, TAKE 1 TABLET BY MOUTH EVERY DAY, Disp: 90 tablet, Rfl: 3   Review of Systems    Constitutional: Negative for chills and fever.  Eyes: Negative for blurred vision and double vision.  Respiratory: Negative for cough, hemoptysis and sputum production.   Gastrointestinal: Negative for heartburn, nausea and vomiting.  Genitourinary: Negative for dysuria.  Endo/Heme/Allergies: Does not bruise/bleed easily.      Allergies:  Beclomethasone; Sulfa antibiotics; Latex; and Meloxicam  Physical Examination:  VS: BP 134/70 (BP Location: Left Arm, Cuff Size: Large)   Pulse 74   Ht 5\' 5"  (1.651 m)   Wt 287 lb (130.2 kg)   SpO2 96%   BMI 47.76 kg/m   General Appearance: No distress  HEENT: PERRLA, no ptosis, no other lesions noticed Pulmonary:normal breath sounds., diaphragmatic excursion normal.No wheezing, No rales   Cardiovascular:  Normal S1,S2.  No m/r/g.     Abdomen:Exam: Benign, Soft, non-tender, No masses  Skin:   warm, no rashes, no ecchymosis  Extremities: normal, no cyanosis, clubbing, warm with normal capillary refill.        Assessment and Plan:59 yo female with very severe OSA on CPAP and seasonal related asthma Cough variant asthma I believe the majority of her symptoms are from allergic rhinitis causing cough-induced asthma. The other differential also includes seasonal allergies that may be causing asthma related symptoms also. Mostly occurring in late winter and spring. In any event, she has significant improvement  with her cough and shortness of breath with Qvar 80 g twice a day. She's also on singular. At this time will continue with singular, and perform stepdown therapy with Qvar 80 g 1 puff twice a day. Patient is advised if symptoms return to then go back to Qvar 80 g 2 puffs twice a day.  OSA (obstructive sleep apnea) Sleep Study - Split 11/2015 AHI= 120 CPAP 13 cm H2O   1. OSA  Discussed sleep data and reviewed with patient.  Encouraged proper weight management.  Excessive weight may contribute to snoring.  Monitor sedative use.   Discussed driving precautions and its relationship with hypersomnolence.  Discussed operating dangerous equipment and its relationship with hypersomnolence.  Discussed sleep hygiene, and benefits of a fixed sleep waked time.  The importance of getting eight or more hours of sleep discussed with patient.  Discussed limiting the use of the computer and television before bedtime.  Decrease naps during the day, so night time sleep will become enhanced.  Limit caffeine, and sleep deprivation.  HTN, stroke, and heart failure are potential risk factors.      Plan: Follow up in  2 month for C-PAP compliance, unless other concerns, issues, or needs develop, then call our office for an appointment.      Cough Attenuant allergy control. Continue with Qvar 1 puff twice a day and Singulair. If symptoms return then go back to Qvar 2 puffs twice a day, 80 g   Updated Medication List Outpatient Encounter Prescriptions as of 12/27/2015  Medication Sig  . albuterol (PROAIR HFA) 108 (90 Base) MCG/ACT inhaler Inhale into the lungs.  . beclomethasone (QVAR) 80 MCG/ACT inhaler Inhale 2 puffs into the lungs 2 (two) times daily.  . Cholecalciferol (VITAMIN D-1000 MAX ST) 1000 UNITS tablet Take by mouth.  . DEXILANT 60 MG capsule Take 1 capsule by mouth daily.  . ferrous sulfate 325 (65 FE) MG tablet TAKE 1 TABLET EVERY DAY  . losartan (COZAAR) 50 MG tablet TAKE 1 TABLET BY MOUTH DAILY  . Magnesium 200 MG TABS Take by mouth.  . montelukast (SINGULAIR) 10 MG tablet Take 1 tablet (10 mg total) by mouth daily.  . verapamil (CALAN-SR) 240 MG CR tablet TAKE 1 TABLET BY MOUTH EVERY DAY  . [DISCONTINUED] amoxicillin (AMOXIL) 500 MG capsule Take 1 capsule (500 mg total) by mouth 3 (three) times daily.  . [DISCONTINUED] benzonatate (TESSALON) 200 MG capsule Take by mouth.   No facility-administered encounter medications on file as of 12/27/2015.     Orders for this visit: No orders of the defined types were  placed in this encounter.   Thank  you for the visitation and for allowing  [facility] to assist in the care of your patient. Our recommendations are noted above.  Please contact us if we can be of further service.  Vilinda Boehringer, MD [facility] Office Number: [phone]  Note: This note was prepared with Dragon dictation along with smaller phrase technology. Any transcriptional errors that result from this process are unintentional.

## 2015-12-27 NOTE — Assessment & Plan Note (Signed)
Attenuant allergy control. Continue with Qvar 1 puff twice a day and Singulair. If symptoms return then go back to Qvar 2 puffs twice a day, 80 g

## 2015-12-27 NOTE — Assessment & Plan Note (Signed)
I believe the majority of her symptoms are from allergic rhinitis causing cough-induced asthma. The other differential also includes seasonal allergies that may be causing asthma related symptoms also. Mostly occurring in late winter and spring. In any event, she has significant improvement with her cough and shortness of breath with Qvar 80 g twice a day. She's also on singular. At this time will continue with singular, and perform stepdown therapy with Qvar 80 g 1 puff twice a day. Patient is advised if symptoms return to then go back to Qvar 80 g 2 puffs twice a day.

## 2015-12-27 NOTE — Assessment & Plan Note (Signed)
Sleep Study - Split 11/2015 AHI= 120 CPAP 13 cm H2O   1. OSA  Discussed sleep data and reviewed with patient.  Encouraged proper weight management.  Excessive weight may contribute to snoring.  Monitor sedative use.  Discussed driving precautions and its relationship with hypersomnolence.  Discussed operating dangerous equipment and its relationship with hypersomnolence.  Discussed sleep hygiene, and benefits of a fixed sleep waked time.  The importance of getting eight or more hours of sleep discussed with patient.  Discussed limiting the use of the computer and television before bedtime.  Decrease naps during the day, so night time sleep will become enhanced.  Limit caffeine, and sleep deprivation.  HTN, stroke, and heart failure are potential risk factors.      Plan: Follow up in  2 month for C-PAP compliance, unless other concerns, issues, or needs develop, then call our office for an appointment.

## 2015-12-27 NOTE — Patient Instructions (Signed)
Follow up with Dr. Stevenson Clinch in:2 months - follow up with CPAP compliance report prior to next visit - try not to sleep on your back - diet, exercise, weight loss - use Qvar 58mcg 1 puff BID, if symptoms return then go back to 2 puff BID.  -gargle and rinse after each use.  - cont with singulair

## 2016-01-06 DIAGNOSIS — G4733 Obstructive sleep apnea (adult) (pediatric): Secondary | ICD-10-CM | POA: Diagnosis not present

## 2016-02-04 ENCOUNTER — Encounter: Payer: Self-pay | Admitting: Internal Medicine

## 2016-02-05 DIAGNOSIS — G4733 Obstructive sleep apnea (adult) (pediatric): Secondary | ICD-10-CM | POA: Diagnosis not present

## 2016-02-08 ENCOUNTER — Other Ambulatory Visit: Payer: Self-pay | Admitting: Internal Medicine

## 2016-02-21 ENCOUNTER — Ambulatory Visit (INDEPENDENT_AMBULATORY_CARE_PROVIDER_SITE_OTHER): Payer: BLUE CROSS/BLUE SHIELD | Admitting: Family Medicine

## 2016-02-21 ENCOUNTER — Encounter: Payer: Self-pay | Admitting: Family Medicine

## 2016-02-21 VITALS — BP 132/70 | HR 80 | Temp 97.8°F | Resp 16 | Wt 288.0 lb

## 2016-02-21 DIAGNOSIS — G4733 Obstructive sleep apnea (adult) (pediatric): Secondary | ICD-10-CM

## 2016-02-21 DIAGNOSIS — M25532 Pain in left wrist: Secondary | ICD-10-CM

## 2016-02-21 DIAGNOSIS — I10 Essential (primary) hypertension: Secondary | ICD-10-CM

## 2016-02-21 MED ORDER — NAPROXEN 500 MG PO TABS: 500.0000 mg | ORAL_TABLET | Freq: Two times a day (BID) | ORAL | 0 refills | Status: DC

## 2016-02-21 NOTE — Patient Instructions (Signed)
Wrist Pain There are many things that can cause wrist pain. Some common causes include:  An injury to the wrist area, such as a sprain, strain, or fracture.  Overuse of the joint.  A condition that causes increased pressure on a nerve in the wrist (carpal tunnel syndrome).  Wear and tear of the joints that occurs with aging (osteoarthritis).  A variety of other types of arthritis. Sometimes, the cause of wrist pain is not known. The pain often goes away when you follow your health care provider's instructions for relieving pain at home. If your wrist pain continues, tests may need to be done to diagnose your condition. HOME CARE INSTRUCTIONS Pay attention to any changes in your symptoms. Take these actions to help with your pain:  Rest the wrist area for at least 48 hours or as told by your health care provider.  If directed, apply ice to the injured area:  Put ice in a plastic bag.  Place a towel between your skin and the bag.  Leave the ice on for 20 minutes, 2-3 times per day.  Keep your arm raised (elevated) above the level of your heart while you are sitting or lying down.  If a splint or elastic bandage has been applied, use it as told by your health care provider.  Remove the splint or bandage only as told by your health care provider.  Loosen the splint or bandage if your fingers become numb or have a tingling feeling, or if they turn cold or blue.  Take over-the-counter and prescription medicines only as told by your health care provider.  Keep all follow-up visits as told by your health care provider. This is important. SEEK MEDICAL CARE IF:  Your pain is not helped by treatment.  Your pain gets worse. SEEK IMMEDIATE MEDICAL CARE IF:  Your fingers become swollen.  Your fingers turn white, very red, or cold and blue.  Your fingers are numb or have a tingling feeling.  You have difficulty moving your fingers.   This information is not intended to replace  advice given to you by your health care provider. Make sure you discuss any questions you have with your health care provider.   Document Released: 01/24/2005 Document Revised: 01/05/2015 Document Reviewed: 09/01/2014 Elsevier Interactive Patient Education 2016 [employer] Inc[employer]  

## 2016-02-21 NOTE — Progress Notes (Signed)
Patient: Sarah Marquez Female    DOB: [birth year]   60 y.o.   MRN: [id number] Visit Date: 02/21/2016  Today's Provider: Wilhemena Durie, MD   Chief Complaint  Patient presents with  . Hypertension   Subjective:    HPI      Hypertension, follow-up:  BP Readings from Last 3 Encounters:  02/21/16 132/70  12/27/15 134/70  11/10/15 126/68    She was last seen for hypertension 4 months ago.  BP at that visit was 124/70. Management since that visit includes checking labs and continuing medications. She reports excellent compliance with treatment. She is not having side effects.  She is not exercising. She is adherent to low salt diet.   Outside blood pressures are 120's/80's. She is experiencing fatigue.  Patient denies chest pain, chest pressure/discomfort, claudication, dyspnea, exertional chest pressure/discomfort, irregular heart beat, lower extremity edema, near-syncope, orthopnea, palpitations and syncope.   Cardiovascular risk factors include dyslipidemia, family history of premature cardiovascular disease, hypertension and obesity (BMI >= 30 kg/m2).  .     Weight trend: stable Wt Readings from Last 3 Encounters:  02/21/16 288 lb (130.6 kg)  12/27/15 287 lb (130.2 kg)  11/10/15 287 lb (130.2 kg)    Current diet: in general, a "healthy" diet    ------------------------------------------------------------------------  Hand Pain Pt c/o left hand pain x 2 weeks. Pt believes she knocked the hand against a drawer at work, and believes this is the cause of the pain. Is located beneath the thumb. Exacerbated by stretching the hand. Is an 8/10 on a pain scale. Has tried Aleve for the pain, without relief. Pt has an appointment with ortho next Wednesday.  Allergies  Allergen Reactions  . Beclomethasone   . Sulfa Antibiotics   . Latex Rash  . Meloxicam Palpitations     Current Outpatient Prescriptions:  .  albuterol (PROAIR HFA) 108 (90 Base) MCG/ACT  inhaler, Inhale into the lungs., Disp: , Rfl:  .  beclomethasone (QVAR) 80 MCG/ACT inhaler, Inhale 2 puffs into the lungs 2 (two) times daily., Disp: , Rfl:  .  Cholecalciferol (VITAMIN D-1000 MAX ST) 1000 UNITS tablet, Take by mouth., Disp: , Rfl:  .  DEXILANT 60 MG capsule, Take 1 capsule by mouth daily., Disp: , Rfl: 11 .  ferrous sulfate 325 (65 FE) MG tablet, TAKE 1 TABLET EVERY DAY, Disp: 90 tablet, Rfl: 3 .  losartan (COZAAR) 50 MG tablet, TAKE 1 TABLET BY MOUTH DAILY, Disp: 30 tablet, Rfl: 9 .  Magnesium 200 MG TABS, Take by mouth., Disp: , Rfl:  .  montelukast (SINGULAIR) 10 MG tablet, TAKE 1 TABLET (10 MG TOTAL) BY MOUTH DAILY., Disp: 30 tablet, Rfl: 2 .  verapamil (CALAN-SR) 240 MG CR tablet, TAKE 1 TABLET BY MOUTH EVERY DAY, Disp: 90 tablet, Rfl: 3  Review of Systems  Constitutional: Positive for fatigue. Negative for activity change, appetite change, chills, diaphoresis, fever and unexpected weight change.  Eyes: Negative.   Respiratory: Negative for shortness of breath.   Cardiovascular: Negative for chest pain, palpitations and leg swelling.  Endocrine: Negative.   Genitourinary: Negative.   Musculoskeletal: Positive for arthralgias.  Allergic/Immunologic: Negative.   Neurological: Negative.   Psychiatric/Behavioral: Negative.     Social History  Substance Use Topics  . Smoking status: Never Smoker  . Smokeless tobacco: Never Used  . Alcohol use No   Objective:   BP 132/70 (BP Location: Left Arm, Patient Position: Sitting, Cuff Size: Large)  Pulse 80   Temp 97.8 F (36.6 C) (Oral)   Resp 16   Wt 288 lb (130.6 kg)   BMI 47.93 kg/m   Physical Exam  Constitutional: She is oriented to person, place, and time. She appears well-developed and well-nourished.  HENT:  Head: Normocephalic and atraumatic.  Eyes: Conjunctivae are normal. No scleral icterus.  Neck: No thyromegaly present.  Cardiovascular: Normal rate, regular rhythm and normal heart sounds.     Pulmonary/Chest: Effort normal and breath sounds normal. No respiratory distress.  Abdominal: Soft.  Musculoskeletal: She exhibits no tenderness.  Tenderness over snuff box left hand/wirst, and tenderness with flexion of wrist.  Lymphadenopathy:    She has no cervical adenopathy.  Neurological: She is alert and oriented to person, place, and time. No cranial nerve deficit. She exhibits normal muscle tone.  Skin: Skin is warm and dry.  Psychiatric: She has a normal mood and affect. Her behavior is normal. Judgment and thought content normal.        Assessment & Plan:     1. Essential (primary) hypertension Stable. Most likely improved due to CPAP.  2. Left wrist pain Will FU with Dr. Rachelle Hora next week. Prefers to wait for ortho to perform xray. Will defer. I have recommended to patient if she needs to see surgery to see a hand surgeon. - naproxen (NAPROSYN) 500 MG tablet; Take 1 tablet (500 mg total) by mouth 2 (two) times daily with a meal.  Dispense: 60 tablet; Refill: 0  3. OSA (obstructive sleep apnea) Controlled on CPAP. Continue using. 4. Morbid obesity We have discussed dietary changes for many years. 5. Allergic rhinitis    Patient seen and examined by Miguel Aschoff, MD, and note scribed by Renaldo Fiddler, CMA.   Richard Cranford Mon, MD  [facility]

## 2016-02-23 ENCOUNTER — Ambulatory Visit: Payer: BLUE CROSS/BLUE SHIELD | Admitting: Internal Medicine

## 2016-02-28 ENCOUNTER — Ambulatory Visit (INDEPENDENT_AMBULATORY_CARE_PROVIDER_SITE_OTHER): Payer: BLUE CROSS/BLUE SHIELD | Admitting: Internal Medicine

## 2016-02-28 ENCOUNTER — Encounter: Payer: Self-pay | Admitting: Internal Medicine

## 2016-02-28 VITALS — BP 134/72 | HR 82 | Ht 65.0 in | Wt 287.0 lb

## 2016-02-28 DIAGNOSIS — J45991 Cough variant asthma: Secondary | ICD-10-CM

## 2016-02-28 DIAGNOSIS — G4733 Obstructive sleep apnea (adult) (pediatric): Secondary | ICD-10-CM

## 2016-02-28 NOTE — Assessment & Plan Note (Signed)
Sleep Study - Split 11/2015 AHI= 120 CPAP 13 cm H2O   1. OSA  Discussed sleep data and reviewed with patient.  Encouraged proper weight management.  Excessive weight may contribute to snoring.  Monitor sedative use.  Discussed driving precautions and its relationship with hypersomnolence.  Discussed operating dangerous equipment and its relationship with hypersomnolence.  Discussed sleep hygiene, and benefits of a fixed sleep waked time.  The importance of getting eight or more hours of sleep discussed with patient.  Discussed limiting the use of the computer and television before bedtime.  Decrease naps during the day, so night time sleep will become enhanced.  Limit caffeine, and sleep deprivation.  HTN, stroke, and heart failure are potential risk factors.   Compliance = 100%    Plan: Follow up in  4 month for C-PAP compliance, unless other concerns, issues, or needs develop, then call our office for an appointment.

## 2016-02-28 NOTE — Progress Notes (Signed)
[facility] Consultation      MRN# [id number] Sarah Marquez 06-24-1955   CC: Chief Complaint  Patient presents with  . Follow-up    doing well on CPAP; no concerns:      Brief History: 60 yo with chronic cough after "bronchitis" in 06/2015, started on Qvar by ENT   Events since last clinic visit: Patient presents today for follow-up visit of her OSA compliance results and chronic cough. Curently on Ovar 50mcg 1 puff bid, doing well overall. Today she does not endorse any coughing significant shortness of breath, fever, chills, nausea, vomiting, diarrhea. Overall she states that her cough is pretty much resolved since being on Qvar and singular. She believes that the majority of her cough occurs during late winter and early spring. She does endorse bilateral leg swelling at the end of the day after walking, but this chronic. Overall happy with Cpap and getting used to it.     Current Outpatient Prescriptions:  .  albuterol (PROAIR HFA) 108 (90 Base) MCG/ACT inhaler, Inhale into the lungs., Disp: , Rfl:  .  beclomethasone (QVAR) 80 MCG/ACT inhaler, Inhale 2 puffs into the lungs 2 (two) times daily., Disp: , Rfl:  .  Cholecalciferol (VITAMIN D-1000 MAX ST) 1000 UNITS tablet, Take by mouth., Disp: , Rfl:  .  DEXILANT 60 MG capsule, Take 1 capsule by mouth daily., Disp: , Rfl: 11 .  ferrous sulfate 325 (65 FE) MG tablet, TAKE 1 TABLET EVERY DAY, Disp: 90 tablet, Rfl: 3 .  losartan (COZAAR) 50 MG tablet, TAKE 1 TABLET BY MOUTH DAILY, Disp: 30 tablet, Rfl: 9 .  Magnesium 200 MG TABS, Take by mouth., Disp: , Rfl:  .  montelukast (SINGULAIR) 10 MG tablet, TAKE 1 TABLET (10 MG TOTAL) BY MOUTH DAILY., Disp: 30 tablet, Rfl: 2 .  naproxen (NAPROSYN) 500 MG tablet, Take 1 tablet (500 mg total) by mouth 2 (two) times daily with a meal., Disp: 60 tablet, Rfl: 0 .  verapamil (CALAN-SR) 240 MG CR tablet, TAKE 1 TABLET BY MOUTH EVERY DAY, Disp: 90 tablet, Rfl: 3   Review  of Systems  Constitutional: Negative for chills and fever.  Eyes: Negative for blurred vision and double vision.  Respiratory: Negative for cough, hemoptysis and sputum production.   Gastrointestinal: Negative for heartburn, nausea and vomiting.  Genitourinary: Negative for dysuria.  Endo/Heme/Allergies: Does not bruise/bleed easily.      Allergies:  Beclomethasone; Sulfa antibiotics; Latex; and Meloxicam  Physical Examination:  VS: BP 134/72 (BP Location: Left Arm, Cuff Size: Normal)   Pulse 82   Ht 5\' 5"  (1.651 m)   Wt 287 lb (130.2 kg)   SpO2 96%   BMI 47.76 kg/m   General Appearance: No distress  HEENT: PERRLA, no ptosis, no other lesions noticed Pulmonary:normal breath sounds., diaphragmatic excursion normal.No wheezing, No rales   Cardiovascular:  Normal S1,S2.  No m/r/g.     Abdomen:Exam: Benign, Soft, non-tender, No masses  Skin:   warm, no rashes, no ecchymosis  Extremities: normal, no cyanosis, clubbing, warm with normal capillary refill.        Assessment and Plan:59 yo female with very severe OSA on CPAP and seasonal related asthma OSA (obstructive sleep apnea) Sleep Study - Split 11/2015 AHI= 120 CPAP 13 cm H2O   1. OSA  Discussed sleep data and reviewed with patient.  Encouraged proper weight management.  Excessive weight may contribute to snoring.  Monitor sedative use.  Discussed driving precautions and its relationship  with hypersomnolence.  Discussed operating dangerous equipment and its relationship with hypersomnolence.  Discussed sleep hygiene, and benefits of a fixed sleep waked time.  The importance of getting eight or more hours of sleep discussed with patient.  Discussed limiting the use of the computer and television before bedtime.  Decrease naps during the day, so night time sleep will become enhanced.  Limit caffeine, and sleep deprivation.  HTN, stroke, and heart failure are potential risk factors.   Compliance = 100%     Plan: Follow up in  4 month for C-PAP compliance, unless other concerns, issues, or needs develop, then call our office for an appointment.      Cough variant asthma I believe the majority of her symptoms are from allergic rhinitis causing cough-induced asthma. The other differential also includes seasonal allergies that may be causing asthma related symptoms also. Mostly occurring in late winter and spring. In any event, she has significant improvement with her cough and shortness of breath with Qvar 80 g twice a day. She's also on singular which we decided today to switch to only during high allergy season and with rapid weather changes - if cough returns, then restart singulair daily.    Updated Medication List Outpatient Encounter Prescriptions as of 02/28/2016  Medication Sig  . albuterol (PROAIR HFA) 108 (90 Base) MCG/ACT inhaler Inhale into the lungs.  . beclomethasone (QVAR) 80 MCG/ACT inhaler Inhale 2 puffs into the lungs 2 (two) times daily.  . Cholecalciferol (VITAMIN D-1000 MAX ST) 1000 UNITS tablet Take by mouth.  . DEXILANT 60 MG capsule Take 1 capsule by mouth daily.  . ferrous sulfate 325 (65 FE) MG tablet TAKE 1 TABLET EVERY DAY  . losartan (COZAAR) 50 MG tablet TAKE 1 TABLET BY MOUTH DAILY  . Magnesium 200 MG TABS Take by mouth.  . montelukast (SINGULAIR) 10 MG tablet TAKE 1 TABLET (10 MG TOTAL) BY MOUTH DAILY.  . naproxen (NAPROSYN) 500 MG tablet Take 1 tablet (500 mg total) by mouth 2 (two) times daily with a meal.  . verapamil (CALAN-SR) 240 MG CR tablet TAKE 1 TABLET BY MOUTH EVERY DAY   No facility-administered encounter medications on file as of 02/28/2016.     Orders for this visit: No orders of the defined types were placed in this encounter.   Thank  you for the visitation and for allowing  [facility] to assist in the care of your patient. Our recommendations are noted above.  Please contact us if we can be of further  service.  Vilinda Boehringer, MD [facility] Office Number: [phone]  Note: This note was prepared with Dragon dictation along with smaller phrase technology. Any transcriptional errors that result from this process are unintentional.

## 2016-02-28 NOTE — Patient Instructions (Signed)
Follow up with Dr. Glennie Hawk in 4 months - cont with CPAP night - Singulair during high allergy season and with rapid weather changes - if cough returns, then restart singulair daily.  - diet and exercise as tolerated.

## 2016-02-28 NOTE — Assessment & Plan Note (Signed)
I believe the majority of her symptoms are from allergic rhinitis causing cough-induced asthma. The other differential also includes seasonal allergies that may be causing asthma related symptoms also. Mostly occurring in late winter and spring. In any event, she has significant improvement with her cough and shortness of breath with Qvar 80 g twice a day. She's also on singular which we decided today to switch to only during high allergy season and with rapid weather changes - if cough returns, then restart singulair daily.

## 2016-02-29 DIAGNOSIS — M1812 Unilateral primary osteoarthritis of first carpometacarpal joint, left hand: Secondary | ICD-10-CM | POA: Diagnosis not present

## 2016-02-29 DIAGNOSIS — M654 Radial styloid tenosynovitis [de Quervain]: Secondary | ICD-10-CM | POA: Diagnosis not present

## 2016-02-29 DIAGNOSIS — M25542 Pain in joints of left hand: Secondary | ICD-10-CM | POA: Diagnosis not present

## 2016-03-07 DIAGNOSIS — G4733 Obstructive sleep apnea (adult) (pediatric): Secondary | ICD-10-CM | POA: Diagnosis not present

## 2016-03-19 ENCOUNTER — Ambulatory Visit: Payer: BLUE CROSS/BLUE SHIELD | Admitting: Internal Medicine

## 2016-03-23 ENCOUNTER — Ambulatory Visit (INDEPENDENT_AMBULATORY_CARE_PROVIDER_SITE_OTHER): Payer: BLUE CROSS/BLUE SHIELD | Admitting: Family Medicine

## 2016-03-23 ENCOUNTER — Encounter: Payer: Self-pay | Admitting: Family Medicine

## 2016-03-23 VITALS — BP 138/70 | HR 66 | Temp 98.0°F | Resp 18 | Wt 292.0 lb

## 2016-03-23 DIAGNOSIS — J0100 Acute maxillary sinusitis, unspecified: Secondary | ICD-10-CM | POA: Diagnosis not present

## 2016-03-23 DIAGNOSIS — J4521 Mild intermittent asthma with (acute) exacerbation: Secondary | ICD-10-CM

## 2016-03-23 MED ORDER — PREDNISONE 5 MG PO TABS: 5.0000 mg | ORAL_TABLET | Freq: Every day | ORAL | 0 refills | Status: DC

## 2016-03-23 MED ORDER — LEVOFLOXACIN 500 MG PO TABS: 500.0000 mg | ORAL_TABLET | Freq: Every day | ORAL | 0 refills | Status: DC

## 2016-03-23 NOTE — Progress Notes (Signed)
Patient: Sarah Marquez Female    DOB: Sep 09, 1955   60 y.o.   MRN: [id number] Visit Date: 03/23/2016  Today's Provider: Vernie Murders, PA   Chief Complaint  Patient presents with  . URI   Subjective:    HPI Pt is here for cough, congestion. It started about 4 days ago and has progressed. Pt reports that she feels bad, has a dry cough, sinus pain, pressure with green/cloudy/clear mucus. She reports that she also has had some wheezing and shortness of breath on exertion but no chest pain. No fevers, chills, body aches or sweats. She reports that her husband has bronchitis and concerned that's what she has as well.     Patient Active Problem List   Diagnosis Date Noted  . Cough variant asthma 12/27/2015  . Dyspnea 11/14/2015  . OSA (obstructive sleep apnea) 11/10/2015  . Recurrent URI (upper respiratory infection) 11/10/2015  . Cough 11/10/2015  . Benign neoplasm of colon 01/19/2015  . Allergic rhinitis 01/19/2015  . Absolute anemia 01/19/2015  . Colon polyp 01/19/2015  . Ankle bruise 01/19/2015  . Dysmetabolic syndrome X [id number]  . Disorder of esophagus 01/19/2015  . Edema 01/19/2015  . Esophagitis, reflux 01/19/2015  . Frontal sinusitis 01/19/2015  . BP (high blood pressure) 01/19/2015  . Anemia, iron deficiency 01/19/2015  . Iron deficiency anemia 01/19/2015  . Abnormal kidney function 01/19/2015  . Cervical pain 01/19/2015  . Dyspnea, paroxysmal nocturnal 01/19/2015  . Gastric ulcer 01/19/2015  . Hive 01/19/2015  . B12 deficiency 01/19/2015  . Vitamin D deficiency 01/19/2015  . Degenerative arthritis of finger 06/07/2014  . Benign paroxysmal positional nystagmus 03/09/2009  . HLD (hyperlipidemia) 11/03/2008  . Fam hx-ischem heart disease 06/26/2008  . History of tobacco use 06/26/2008  . Essential (primary) hypertension 06/03/2008  . Extreme obesity ([place]) 06/03/2008  . Apnea, sleep 06/03/2008  . Morbid obesity ([place]) 06/03/2008  . H/O total  hysterectomy 04/30/1998   Past Surgical History:  Procedure Laterality Date  . ABDOMINAL HYSTERECTOMY    . APPENDECTOMY    . CARDIAC CATHETERIZATION    . LUMBAR DISC SURGERY     ruptured disc x 2  . TONSILLECTOMY AND ADENOIDECTOMY     Family History  Problem Relation Age of Onset  . Breast cancer Mother   . Heart attack Father   . Heart attack Brother     Allergies  Allergen Reactions  . Beclomethasone   . Sulfa Antibiotics   . Latex Rash  . Meloxicam Palpitations    Current Outpatient Prescriptions:  .  albuterol (PROAIR HFA) 108 (90 Base) MCG/ACT inhaler, Inhale into the lungs., Disp: , Rfl:  .  beclomethasone (QVAR) 80 MCG/ACT inhaler, Inhale 2 puffs into the lungs 2 (two) times daily., Disp: , Rfl:  .  Cholecalciferol (VITAMIN D-1000 MAX ST) 1000 UNITS tablet, Take by mouth., Disp: , Rfl:  .  DEXILANT 60 MG capsule, Take 1 capsule by mouth daily., Disp: , Rfl: 11 .  ferrous sulfate 325 (65 FE) MG tablet, TAKE 1 TABLET EVERY DAY, Disp: 90 tablet, Rfl: 3 .  losartan (COZAAR) 50 MG tablet, TAKE 1 TABLET BY MOUTH DAILY, Disp: 30 tablet, Rfl: 9 .  Magnesium 200 MG TABS, Take by mouth., Disp: , Rfl:  .  montelukast (SINGULAIR) 10 MG tablet, TAKE 1 TABLET (10 MG TOTAL) BY MOUTH DAILY., Disp: 30 tablet, Rfl: 2 .  verapamil (CALAN-SR) 240 MG CR tablet, TAKE 1 TABLET BY MOUTH EVERY DAY, Disp:  90 tablet, Rfl: 3 .  naproxen (NAPROSYN) 500 MG tablet, Take 1 tablet (500 mg total) by mouth 2 (two) times daily with a meal. (Patient not taking: Reported on 03/23/2016), Disp: 60 tablet, Rfl: 0  Review of Systems  Constitutional: Positive for fatigue.  HENT: Positive for congestion, postnasal drip, rhinorrhea, sinus pain and sinus pressure.   Eyes: Negative.   Respiratory: Positive for cough, shortness of breath and wheezing.   Cardiovascular: Negative.   Gastrointestinal: Negative.   Endocrine: Negative.   Genitourinary: Negative.   Musculoskeletal: Negative.   Skin: Negative.     Allergic/Immunologic: Negative.   Neurological: Negative.   Hematological: Negative.   Psychiatric/Behavioral: Negative.     Social History  Substance Use Topics  . Smoking status: Never Smoker  . Smokeless tobacco: Never Used  . Alcohol use No   Objective:   BP 138/70 (BP Location: Left Arm, Patient Position: Sitting, Cuff Size: Large)   Pulse 66   Temp 98 F (36.7 C) (Oral)   Resp 18   Wt 292 lb (132.5 kg)   SpO2 96%   BMI 48.59 kg/m   Physical Exam  Constitutional: She is oriented to person, place, and time. She appears well-developed and well-nourished. No distress.  HENT:  Head: Normocephalic and atraumatic.  Right Ear: Hearing and external ear normal.  Left Ear: Hearing and external ear normal.  Nose: Nose normal.  Mouth/Throat: Oropharynx is clear and moist.  Poor transillumination of maxillary sinuses. Minimal tenderness.  Eyes: Conjunctivae and lids are normal. Right eye exhibits no discharge. Left eye exhibits no discharge. No scleral icterus.  Neck: Neck supple.  Cardiovascular: Normal rate and regular rhythm.   Pulmonary/Chest: Effort normal. No respiratory distress. She has wheezes.  Coarse breath sounds with slight wheeze occasionally.  Musculoskeletal: Normal range of motion.  Neurological: She is alert and oriented to person, place, and time.  Skin: Skin is intact. No lesion and no rash noted.  Psychiatric: She has a normal mood and affect. Her speech is normal and behavior is normal. Thought content normal.      Assessment & Plan:     1. Subacute maxillary sinusitis Onset with dry cough and green, cloudy nasal mucus. Poor transillumination of maxillary sinuses with mild discomfort. Treat with Levaquin and prednisone taper. Recheck prn. - levofloxacin (LEVAQUIN) 500 MG tablet; Take 1 tablet (500 mg total) by mouth daily.  Dispense: 7 tablet; Refill: 0 - predniSONE (DELTASONE) 5 MG tablet; Take 1 tablet (5 mg total) by mouth daily with breakfast. Taper  down by one tablet daily to complete 6 day treatment (6,5,4,3,2,1)  Dispense: 21 tablet; Refill: 0  2. Mild intermittent asthmatic bronchitis with acute exacerbation Some slight wheeze with dry cough. Will treat with Levaquin for sinobronchial syndrome and prednisone taper to decrease inflammation. Increase fluid intake and use Mucinex-DM for cough and congestion. Use Albuterol prn wheeze and cough. Recheck prn. - levofloxacin (LEVAQUIN) 500 MG tablet; Take 1 tablet (500 mg total) by mouth daily.  Dispense: 7 tablet; Refill: 0 - predniSONE (DELTASONE) 5 MG tablet; Take 1 tablet (5 mg total) by mouth daily with breakfast. Taper down by one tablet daily to complete 6 day treatment (6,5,4,3,2,1)  Dispense: 21 tablet; Refill: [place], [place]  [facility]

## 2016-04-05 ENCOUNTER — Other Ambulatory Visit: Payer: Self-pay | Admitting: Family Medicine

## 2016-04-06 DIAGNOSIS — G4733 Obstructive sleep apnea (adult) (pediatric): Secondary | ICD-10-CM | POA: Diagnosis not present

## 2016-05-01 ENCOUNTER — Other Ambulatory Visit: Payer: Self-pay | Admitting: Family Medicine

## 2016-05-05 ENCOUNTER — Other Ambulatory Visit: Payer: Self-pay | Admitting: Family Medicine

## 2016-06-16 ENCOUNTER — Other Ambulatory Visit: Payer: Self-pay | Admitting: Family Medicine

## 2016-07-19 DIAGNOSIS — J0100 Acute maxillary sinusitis, unspecified: Secondary | ICD-10-CM | POA: Diagnosis not present

## 2016-07-23 DIAGNOSIS — J209 Acute bronchitis, unspecified: Secondary | ICD-10-CM | POA: Diagnosis not present

## 2016-07-23 DIAGNOSIS — J309 Allergic rhinitis, unspecified: Secondary | ICD-10-CM | POA: Diagnosis not present

## 2016-07-23 DIAGNOSIS — R05 Cough: Secondary | ICD-10-CM | POA: Diagnosis not present

## 2016-08-23 ENCOUNTER — Ambulatory Visit (INDEPENDENT_AMBULATORY_CARE_PROVIDER_SITE_OTHER): Payer: BLUE CROSS/BLUE SHIELD | Admitting: Family Medicine

## 2016-08-23 VITALS — BP 122/64 | HR 64 | Temp 97.8°F | Resp 12 | Wt 282.0 lb

## 2016-08-23 DIAGNOSIS — E784 Other hyperlipidemia: Secondary | ICD-10-CM

## 2016-08-23 DIAGNOSIS — Z1231 Encounter for screening mammogram for malignant neoplasm of breast: Secondary | ICD-10-CM

## 2016-08-23 DIAGNOSIS — I10 Essential (primary) hypertension: Secondary | ICD-10-CM | POA: Diagnosis not present

## 2016-08-23 DIAGNOSIS — G4733 Obstructive sleep apnea (adult) (pediatric): Secondary | ICD-10-CM

## 2016-08-23 DIAGNOSIS — E7849 Other hyperlipidemia: Secondary | ICD-10-CM

## 2016-08-23 DIAGNOSIS — Z1239 Encounter for other screening for malignant neoplasm of breast: Secondary | ICD-10-CM

## 2016-08-23 DIAGNOSIS — D508 Other iron deficiency anemias: Secondary | ICD-10-CM

## 2016-08-23 NOTE — Progress Notes (Signed)
Sarah Marquez  MRN: [id number] DOB: 1956-01-16  Subjective:  HPI  Patient is here for 6 months follow up. B/P: patient is not checking her b/p. No cardiac symptoms present. BP Readings from Last 3 Encounters:  08/23/16 122/64  03/23/16 138/70  02/28/16 134/72   GERD: patient takes Dexilant daily and symptoms are controlled on this regimen.  AR: patient takes Singulair daily. Sleep apnea: patient uses CPAP at night every night about 5 hours a night. Last labs were done on 10/06/15. Patient wanted to know if she should continue taking iron tablets. Patient Active Problem List   Diagnosis Date Noted  . Cough variant asthma 12/27/2015  . Dyspnea 11/14/2015  . OSA (obstructive sleep apnea) 11/10/2015  . Recurrent URI (upper respiratory infection) 11/10/2015  . Cough 11/10/2015  . Benign neoplasm of colon 01/19/2015  . Allergic rhinitis 01/19/2015  . Absolute anemia 01/19/2015  . Colon polyp 01/19/2015  . Ankle bruise 01/19/2015  . Dysmetabolic syndrome X 31/51/7616  . Disorder of esophagus 01/19/2015  . Edema 01/19/2015  . Esophagitis, reflux 01/19/2015  . Frontal sinusitis 01/19/2015  . BP (high blood pressure) 01/19/2015  . Anemia, iron deficiency 01/19/2015  . Iron deficiency anemia 01/19/2015  . Abnormal kidney function 01/19/2015  . Cervical pain 01/19/2015  . Dyspnea, paroxysmal nocturnal 01/19/2015  . Gastric ulcer 01/19/2015  . Hive 01/19/2015  . B12 deficiency 01/19/2015  . Vitamin D deficiency 01/19/2015  . Degenerative arthritis of finger 06/07/2014  . Benign paroxysmal positional nystagmus 03/09/2009  . HLD (hyperlipidemia) 11/03/2008  . Fam hx-ischem heart disease 06/26/2008  . History of tobacco use 06/26/2008  . Essential (primary) hypertension 06/03/2008  . Extreme obesity 06/03/2008  . Apnea, sleep 06/03/2008  . Morbid obesity ([place]) 06/03/2008  . H/O total hysterectomy 04/30/1998    No past medical history on file.  Social History   Social  History  . Marital status: Married    Spouse name: N/A  . Number of children: N/A  . Years of education: N/A   Occupational History  . Not on file.   Social History Main Topics  . Smoking status: Never Smoker  . Smokeless tobacco: Never Used  . Alcohol use No  . Drug use: No  . Sexual activity: Not on file   Other Topics Concern  . Not on file   Social History Narrative  . No narrative on file    Outpatient Encounter Prescriptions as of 08/23/2016  Medication Sig  . albuterol (PROAIR HFA) 108 (90 Base) MCG/ACT inhaler Inhale into the lungs.  . beclomethasone (QVAR) 80 MCG/ACT inhaler Inhale 2 puffs into the lungs 2 (two) times daily.  Marland Kitchen DEXILANT 60 MG capsule Take 1 capsule by mouth daily.  . ferrous sulfate 325 (65 FE) MG tablet TAKE 1 TABLET BY MOUTH EVERY DAY  . losartan (COZAAR) 50 MG tablet TAKE 1 TABLET BY MOUTH EVERY DAY  . Magnesium 200 MG TABS Take by mouth.  . montelukast (SINGULAIR) 10 MG tablet TAKE 1 TABLET (10 MG TOTAL) BY MOUTH DAILY.  . verapamil (CALAN-SR) 240 MG CR tablet TAKE 1 TABLET BY MOUTH EVERY DAY  . Cholecalciferol (VITAMIN D-1000 MAX ST) 1000 UNITS tablet Take by mouth.  . [DISCONTINUED] ferrous sulfate 325 (65 FE) MG tablet TAKE 1 TABLET BY MOUTH EVERY DAY  . [DISCONTINUED] levofloxacin (LEVAQUIN) 500 MG tablet Take 1 tablet (500 mg total) by mouth daily.  . [DISCONTINUED] naproxen (NAPROSYN) 500 MG tablet Take 1 tablet (500 mg total) by mouth  2 (two) times daily with a meal. (Patient not taking: Reported on 03/23/2016)  . [DISCONTINUED] predniSONE (DELTASONE) 5 MG tablet Take 1 tablet (5 mg total) by mouth daily with breakfast. Taper down by one tablet daily to complete 6 day treatment (6,5,4,3,2,1)   No facility-administered encounter medications on file as of 08/23/2016.     Allergies  Allergen Reactions  . Beclomethasone   . Sulfa Antibiotics   . Latex Rash  . Meloxicam Palpitations    Review of Systems  Constitutional: Negative.     Respiratory: Negative.   Cardiovascular: Negative.   Gastrointestinal: Negative.   Musculoskeletal: Negative.   Neurological: Negative.     Objective:  BP 122/64   Pulse 64   Temp 97.8 F (36.6 C)   Resp 12   Wt 282 lb (127.9 kg)   BMI 46.93 kg/m   Physical Exam  Constitutional: She is oriented to person, place, and time and well-developed, well-nourished, and in no distress.  HENT:  Head: Normocephalic and atraumatic.  Eyes: Conjunctivae are normal. No scleral icterus.  Neck: No thyromegaly present.  Cardiovascular: Normal rate, regular rhythm and normal heart sounds.   Pulmonary/Chest: Effort normal and breath sounds normal.  Abdominal: Soft.  Neurological: She is alert and oriented to person, place, and time. Gait normal. GCS score is 15.  Skin: Skin is warm and dry.  Psychiatric: Mood, memory, affect and judgment normal.    Assessment and Plan :  1. Essential (primary) hypertension - TSH  2. OSA (obstructive sleep apnea)  3. Other iron deficiency anemia - CBC with Differential/Platelet - Iron  4. Other hyperlipidemia - Lipid Panel With LDL/HDL Ratio - Comprehensive metabolic panel  5. Screening for breast cancer - MM DIGITAL SCREENING BILATERAL; Future 6.Obesity Diet and exercise stressed.  HPI, Exam and A&P transcribed by Theressa Millard, RMA under direction and in the presence of Miguel Aschoff, MD. I have done the exam and reviewed the chart and it is accurate to the best of my knowledge. Development worker, community has been used and  any errors in dictation or transcription are unintentional. Miguel Aschoff M.D. [facility]

## 2016-08-24 LAB — CBC WITH DIFFERENTIAL/PLATELET
BASOS: 0 %
Basophils Absolute: 0 10*3/uL (ref 0.0–0.2)
EOS (ABSOLUTE): 0.5 10*3/uL — ABNORMAL HIGH (ref 0.0–0.4)
EOS: 6 %
HEMATOCRIT: 38.8 % (ref 34.0–46.6)
HEMOGLOBIN: 12.9 g/dL (ref 11.1–15.9)
IMMATURE GRANULOCYTES: 0 %
Immature Grans (Abs): 0 10*3/uL (ref 0.0–0.1)
LYMPHS ABS: 2.1 10*3/uL (ref 0.7–3.1)
Lymphs: 25 %
MCH: 28.7 pg (ref 26.6–33.0)
MCHC: 33.2 g/dL (ref 31.5–35.7)
MCV: 86 fL (ref 79–97)
MONOS ABS: 0.6 10*3/uL (ref 0.1–0.9)
Monocytes: 7 %
NEUTROS PCT: 62 %
Neutrophils Absolute: 5.2 10*3/uL (ref 1.4–7.0)
Platelets: 256 10*3/uL (ref 150–379)
RBC: 4.49 x10E6/uL (ref 3.77–5.28)
RDW: 14.9 % (ref 12.3–15.4)
WBC: 8.4 10*3/uL (ref 3.4–10.8)

## 2016-08-24 LAB — IRON: Iron: 62 ug/dL (ref 27–139)

## 2016-08-24 LAB — TSH: TSH: 3.16 u[IU]/mL (ref 0.450–4.500)

## 2016-08-24 LAB — COMPREHENSIVE METABOLIC PANEL
ALK PHOS: 121 IU/L — AB (ref 39–117)
ALT: 12 IU/L (ref 0–32)
AST: 17 IU/L (ref 0–40)
Albumin/Globulin Ratio: 1.3 (ref 1.2–2.2)
Albumin: 3.7 g/dL (ref 3.6–4.8)
BUN/Creatinine Ratio: 13 (ref 12–28)
BUN: 13 mg/dL (ref 8–27)
Bilirubin Total: 0.6 mg/dL (ref 0.0–1.2)
CO2: 26 mmol/L (ref 18–29)
CREATININE: 1.02 mg/dL — AB (ref 0.57–1.00)
Calcium: 9.1 mg/dL (ref 8.7–10.3)
Chloride: 102 mmol/L (ref 96–106)
GFR calc Af Amer: 68 mL/min/{1.73_m2} (ref >59)
GFR calc non Af Amer: 59 mL/min/{1.73_m2} — ABNORMAL LOW (ref >59)
GLOBULIN, TOTAL: 2.8 g/dL (ref 1.5–4.5)
GLUCOSE: 92 mg/dL (ref 65–99)
Potassium: 4.5 mmol/L (ref 3.5–5.2)
SODIUM: 142 mmol/L (ref 134–144)
Total Protein: 6.5 g/dL (ref 6.0–8.5)

## 2016-08-24 LAB — LIPID PANEL WITH LDL/HDL RATIO
Cholesterol, Total: 172 mg/dL (ref 100–199)
HDL: 32 mg/dL — ABNORMAL LOW (ref >39)
LDL CALC: 111 mg/dL — AB (ref 0–99)
LDL/HDL RATIO: 3.5 ratio — AB (ref 0.0–3.2)
Triglycerides: 147 mg/dL (ref 0–149)
VLDL Cholesterol Cal: 29 mg/dL (ref 5–40)

## 2016-08-27 ENCOUNTER — Other Ambulatory Visit: Payer: Self-pay

## 2016-09-04 DIAGNOSIS — G4733 Obstructive sleep apnea (adult) (pediatric): Secondary | ICD-10-CM | POA: Diagnosis not present

## 2016-09-19 ENCOUNTER — Ambulatory Visit
Admission: RE | Admit: 2016-09-19 | Discharge: 2016-09-19 | Disposition: A | Discharge status: Home / Self Care | Payer: BLUE CROSS/BLUE SHIELD | Source: Ambulatory Visit | Attending: Family Medicine | Admitting: Family Medicine

## 2016-09-19 DIAGNOSIS — Z1231 Encounter for screening mammogram for malignant neoplasm of breast: Secondary | ICD-10-CM | POA: Diagnosis not present

## 2016-09-19 DIAGNOSIS — H43812 Vitreous degeneration, left eye: Secondary | ICD-10-CM | POA: Diagnosis not present

## 2016-09-19 DIAGNOSIS — Z1239 Encounter for other screening for malignant neoplasm of breast: Secondary | ICD-10-CM

## 2016-09-25 ENCOUNTER — Other Ambulatory Visit: Payer: Self-pay | Admitting: *Deleted

## 2016-09-25 ENCOUNTER — Inpatient Hospital Stay
Admission: RE | Admit: 2016-09-25 | Discharge: 2016-09-25 | Disposition: A | Discharge status: Home / Self Care | Payer: Self-pay | Source: Ambulatory Visit | Attending: *Deleted | Admitting: *Deleted

## 2016-09-25 DIAGNOSIS — Z9289 Personal history of other medical treatment: Secondary | ICD-10-CM

## 2017-01-23 ENCOUNTER — Encounter: Payer: BLUE CROSS/BLUE SHIELD | Admitting: Family Medicine

## 2017-01-28 ENCOUNTER — Ambulatory Visit (INDEPENDENT_AMBULATORY_CARE_PROVIDER_SITE_OTHER): Payer: BLUE CROSS/BLUE SHIELD | Admitting: Family Medicine

## 2017-01-28 VITALS — BP 118/64 | HR 60 | Temp 97.7°F | Resp 16 | Wt 279.6 lb

## 2017-01-28 DIAGNOSIS — K2100 Gastro-esophageal reflux disease with esophagitis, without bleeding: Secondary | ICD-10-CM

## 2017-01-28 DIAGNOSIS — Z2821 Immunization not carried out because of patient refusal: Secondary | ICD-10-CM

## 2017-01-28 DIAGNOSIS — I10 Essential (primary) hypertension: Secondary | ICD-10-CM

## 2017-01-28 DIAGNOSIS — D508 Other iron deficiency anemias: Secondary | ICD-10-CM

## 2017-01-28 DIAGNOSIS — Z6841 Body Mass Index (BMI) 40.0 and over, adult: Secondary | ICD-10-CM

## 2017-01-28 DIAGNOSIS — E7849 Other hyperlipidemia: Secondary | ICD-10-CM

## 2017-01-28 DIAGNOSIS — Z01419 Encounter for gynecological examination (general) (routine) without abnormal findings: Secondary | ICD-10-CM

## 2017-01-28 NOTE — Progress Notes (Signed)
Sarah Marquez  MRN: [id number] DOB: 27-Oct-1955  Subjective:  HPI  Patient is here for follow up. Last office visit was on 08/23/16. Routine lab work was done at that time including Iron level which was 62, at that time patient was advised to stop iron tablets.  Patient is not checking her b/p. No cardiac symptoms. Wt Readings from Last 3 Encounters:  01/28/17 279 lb 9.6 oz (126.8 kg)  08/23/16 282 lb (127.9 kg)  03/23/16 292 lb (132.5 kg)   BP Readings from Last 3 Encounters:  01/28/17 118/64  08/23/16 122/64  03/23/16 138/70   Patient takes Dexilant daily for GERD. Symptoms are controlled, she has tried to wean off the medication before and had breakthrough symptoms.  Patient Active Problem List   Diagnosis Date Noted  . Cough variant asthma 12/27/2015  . Dyspnea 11/14/2015  . OSA (obstructive sleep apnea) 11/10/2015  . Recurrent URI (upper respiratory infection) 11/10/2015  . Cough 11/10/2015  . Benign neoplasm of colon 01/19/2015  . Allergic rhinitis 01/19/2015  . Absolute anemia 01/19/2015  . Colon polyp 01/19/2015  . Ankle bruise 01/19/2015  . Dysmetabolic syndrome X 62/95/2841  . Disorder of esophagus 01/19/2015  . Edema 01/19/2015  . Esophagitis, reflux 01/19/2015  . Frontal sinusitis 01/19/2015  . BP (high blood pressure) 01/19/2015  . Anemia, iron deficiency 01/19/2015  . Iron deficiency anemia 01/19/2015  . Abnormal kidney function 01/19/2015  . Cervical pain 01/19/2015  . Dyspnea, paroxysmal nocturnal 01/19/2015  . Gastric ulcer 01/19/2015  . Hive 01/19/2015  . B12 deficiency 01/19/2015  . Vitamin D deficiency 01/19/2015  . Degenerative arthritis of finger 06/07/2014  . Benign paroxysmal positional nystagmus 03/09/2009  . HLD (hyperlipidemia) 11/03/2008  . Fam hx-ischem heart disease 06/26/2008  . History of tobacco use 06/26/2008  . Essential (primary) hypertension 06/03/2008  . Extreme obesity 06/03/2008  . Apnea, sleep 06/03/2008  . Morbid  obesity ([place]) 06/03/2008  . H/O total hysterectomy 04/30/1998    No past medical history on file.  Social History   Social History  . Marital status: Married    Spouse name: N/A  . Number of children: N/A  . Years of education: N/A   Occupational History  . Not on file.   Social History Main Topics  . Smoking status: Never Smoker  . Smokeless tobacco: Never Used  . Alcohol use No  . Drug use: No  . Sexual activity: Not on file   Other Topics Concern  . Not on file   Social History Narrative  . No narrative on file    Outpatient Encounter Prescriptions as of 01/28/2017  Medication Sig  . albuterol (PROAIR HFA) 108 (90 Base) MCG/ACT inhaler Inhale into the lungs.  . beclomethasone (QVAR) 80 MCG/ACT inhaler Inhale 2 puffs into the lungs 2 (two) times daily.  . Cholecalciferol (VITAMIN D-1000 MAX ST) 1000 UNITS tablet Take by mouth.  . DEXILANT 60 MG capsule Take 1 capsule by mouth daily.  Marland Kitchen losartan (COZAAR) 50 MG tablet TAKE 1 TABLET BY MOUTH EVERY DAY  . Magnesium 200 MG TABS Take by mouth.  . montelukast (SINGULAIR) 10 MG tablet TAKE 1 TABLET (10 MG TOTAL) BY MOUTH DAILY.  . verapamil (CALAN-SR) 240 MG CR tablet TAKE 1 TABLET BY MOUTH EVERY DAY   No facility-administered encounter medications on file as of 01/28/2017.     Allergies  Allergen Reactions  . Beclomethasone   . Sulfa Antibiotics   . Latex Rash  . Meloxicam Palpitations  Review of Systems  Constitutional: Negative.   Respiratory: Negative.   Cardiovascular: Negative.   Gastrointestinal: Negative.        Controlled on medication  Musculoskeletal: Positive for joint pain (knee pain).       Feet swelling  Neurological: Negative.     Objective:  BP 118/64   Pulse 60   Temp 97.7 F (36.5 C)   Resp 16   Wt 279 lb 9.6 oz (126.8 kg)   BMI 46.53 kg/m   Physical Exam  Constitutional: She is oriented to person, place, and time and well-developed, well-nourished, and in no distress.  HENT:    Head: Normocephalic and atraumatic.  Eyes: Pupils are equal, round, and reactive to light. Conjunctivae are normal.  Cardiovascular: Normal rate, regular rhythm, normal heart sounds and intact distal pulses.  Exam reveals no gallop.   No murmur heard. Pulmonary/Chest: Effort normal and breath sounds normal. No respiratory distress. She has no wheezes.  Abdominal: Soft. She exhibits no distension. There is no tenderness. There is no rebound.  Neurological: She is alert and oriented to person, place, and time.  Psychiatric: Mood, memory, affect and judgment normal.    Assessment and Plan :  1. Essential (primary) hypertension Stable. Continue current medication. Edema-will follow, no changes to be made at this time, edema improves in the mornings. Advised patient she can try compression stockings. 2. Other iron deficiency anemia 3. Other hyperlipidemia 4. Influenza vaccination declined by patient  5. BMI 45.0-49.9, adult [facility]) Work on exercising and continuing to improve eating habits.  6. Esophagitis, reflux Controlled on Dexilant.  7. Refer to Dr Kenton Kingfisher at [facility] side for gynecological exam. Will refer for Colonoscopy on last visit, she will be due at that time.  HPI, Exam and A&P transcribed by Tiffany Kocher, RMA under direction and in the presence of Miguel Aschoff, MD. I have done the exam and reviewed the chart and it is accurate to the best of my knowledge. Development worker, community has been used and  any errors in dictation or transcription are unintentional. Miguel Aschoff M.D. [facility]

## 2017-01-28 NOTE — Patient Instructions (Signed)
Calorie Counting for Weight Loss Calories are units of energy. Your body needs a certain amount of calories from food to keep you going throughout the day. When you eat more calories than your body needs, your body stores the extra calories as fat. When you eat fewer calories than your body needs, your body burns fat to get the energy it needs. Calorie counting means keeping track of how many calories you eat and drink each day. Calorie counting can be helpful if you need to lose weight. If you make sure to eat fewer calories than your body needs, you should lose weight. Ask your health care provider what a healthy weight is for you. For calorie counting to work, you will need to eat the right number of calories in a day in order to lose a healthy amount of weight per week. A dietitian can help you determine how many calories you need in a day and will give you suggestions on how to reach your calorie goal.  A healthy amount of weight to lose per week is usually 1-2 lb (0.5-0.9 kg). This usually means that your daily calorie intake should be reduced by 500-750 calories.  Eating 1,200 - 1,500 calories per day can help most women lose weight.  Eating 1,500 - 1,800 calories per day can help most men lose weight.  What is my plan? My goal is to have __________ calories per day. If I have this many calories per day, I should lose around __________ pounds per week. What do I need to know about calorie counting? In order to meet your daily calorie goal, you will need to:  Find out how many calories are in each food you would like to eat. Try to do this before you eat.  Decide how much of the food you plan to eat.  Write down what you ate and how many calories it had. Doing this is called keeping a food log.  To successfully lose weight, it is important to balance calorie counting with a healthy lifestyle that includes regular activity. Aim for 150 minutes of moderate exercise (such as walking) or 75  minutes of vigorous exercise (such as running) each week. Where do I find calorie information?  The number of calories in a food can be found on a Nutrition Facts label. If a food does not have a Nutrition Facts label, try to look up the calories online or ask your dietitian for help. Remember that calories are listed per serving. If you choose to have more than one serving of a food, you will have to multiply the calories per serving by the amount of servings you plan to eat. For example, the label on a package of bread might say that a serving size is 1 slice and that there are 90 calories in a serving. If you eat 1 slice, you will have eaten 90 calories. If you eat 2 slices, you will have eaten 180 calories. How do I keep a food log? Immediately after each meal, record the following information in your food log:  What you ate. Don't forget to include toppings, sauces, and other extras on the food.  How much you ate. This can be measured in cups, ounces, or number of items.  How many calories each food and drink had.  The total number of calories in the meal.  Keep your food log near you, such as in a small notebook in your pocket, or use a mobile app or website. Some   programs will calculate calories for you and show you how many calories you have left for the day to meet your goal. What are some calorie counting tips?  Use your calories on foods and drinks that will fill you up and not leave you hungry: ? Some examples of foods that fill you up are nuts and nut butters, vegetables, lean proteins, and high-fiber foods like whole grains. High-fiber foods are foods with more than 5 g fiber per serving. ? Drinks such as sodas, specialty coffee drinks, alcohol, and juices have a lot of calories, yet do not fill you up.  Eat nutritious foods and avoid empty calories. Empty calories are calories you get from foods or beverages that do not have many vitamins or protein, such as candy, sweets, and  soda. It is better to have a nutritious high-calorie food (such as an avocado) than a food with few nutrients (such as a bag of chips).  Know how many calories are in the foods you eat most often. This will help you calculate calorie counts faster.  Pay attention to calories in drinks. Low-calorie drinks include water and unsweetened drinks.  Pay attention to nutrition labels for "low fat" or "fat free" foods. These foods sometimes have the same amount of calories or more calories than the full fat versions. They also often have added sugar, starch, or salt, to make up for flavor that was removed with the fat.  Find a way of tracking calories that works for you. Get creative. Try different apps or programs if writing down calories does not work for you. What are some portion control tips?  Know how many calories are in a serving. This will help you know how many servings of a certain food you can have.  Use a measuring cup to measure serving sizes. You could also try weighing out portions on a kitchen scale. With time, you will be able to estimate serving sizes for some foods.  Take some time to put servings of different foods on your favorite plates, bowls, and cups so you know what a serving looks like.  Try not to eat straight from a bag or box. Doing this can lead to overeating. Put the amount you would like to eat in a cup or on a plate to make sure you are eating the right portion.  Use smaller plates, glasses, and bowls to prevent overeating.  Try not to multitask (for example, watch TV or use your computer) while eating. If it is time to eat, sit down at a table and enjoy your food. This will help you to know when you are full. It will also help you to be aware of what you are eating and how much you are eating. What are tips for following this plan? Reading food labels  Check the calorie count compared to the serving size. The serving size may be smaller than what you are used to  eating.  Check the source of the calories. Make sure the food you are eating is high in vitamins and protein and low in saturated and trans fats. Shopping  Read nutrition labels while you shop. This will help you make healthy decisions before you decide to purchase your food.  Make a grocery list and stick to it. Cooking  Try to cook your favorite foods in a healthier way. For example, try baking instead of frying.  Use low-fat dairy products. Meal planning  Use more fruits and vegetables. Half of your plate should   be fruits and vegetables.  Include lean proteins like poultry and fish. How do I count calories when eating out?  Ask for smaller portion sizes.  Consider sharing an entree and sides instead of getting your own entree.  If you get your own entree, eat only half. Ask for a box at the beginning of your meal and put the rest of your entree in it so you are not tempted to eat it.  If calories are listed on the menu, choose the lower calorie options.  Choose dishes that include vegetables, fruits, whole grains, low-fat dairy products, and lean protein.  Choose items that are boiled, broiled, grilled, or steamed. Stay away from items that are buttered, battered, fried, or served with cream sauce. Items labeled "crispy" are usually fried, unless stated otherwise.  Choose water, low-fat milk, unsweetened iced tea, or other drinks without added sugar. If you want an alcoholic beverage, choose a lower calorie option such as a glass of wine or light beer.  Ask for dressings, sauces, and syrups on the side. These are usually high in calories, so you should limit the amount you eat.  If you want a salad, choose a garden salad and ask for grilled meats. Avoid extra toppings like bacon, cheese, or fried items. Ask for the dressing on the side, or ask for olive oil and vinegar or lemon to use as dressing.  Estimate how many servings of a food you are given. For example, a serving of  cooked rice is  cup or about the size of half a baseball. Knowing serving sizes will help you be aware of how much food you are eating at restaurants. The list below tells you how big or small some common portion sizes are based on everyday objects: ? 1 oz-4 stacked dice. ? 3 oz-1 deck of cards. ? 1 tsp-1 die. ? 1 Tbsp- a ping-pong ball. ? 2 Tbsp-1 ping-pong ball. ?  cup- baseball. ? 1 cup-1 baseball. Summary  Calorie counting means keeping track of how many calories you eat and drink each day. If you eat fewer calories than your body needs, you should lose weight.  A healthy amount of weight to lose per week is usually 1-2 lb (0.5-0.9 kg). This usually means reducing your daily calorie intake by 500-750 calories.  The number of calories in a food can be found on a Nutrition Facts label. If a food does not have a Nutrition Facts label, try to look up the calories online or ask your dietitian for help.  Use your calories on foods and drinks that will fill you up, and not on foods and drinks that will leave you hungry.  Use smaller plates, glasses, and bowls to prevent overeating. This information is not intended to replace advice given to you by your health care provider. Make sure you discuss any questions you have with your health care provider. Document Released: 04/16/2005 Document Revised: 03/16/2016 Document Reviewed: 03/16/2016 Elsevier Interactive Patient Education  2017 [employer]. Exercising to [employer] Exercising regularly is important. It has many health benefits, such as:  Improving your overall fitness, flexibility, and endurance.  Increasing your bone density.  Helping with weight control.  Decreasing your body fat.  Increasing your muscle strength.  Reducing stress and tension.  Improving your overall health.  In order to become healthy and stay healthy, it is recommended that you do moderate-intensity and vigorous-intensity exercise. You can tell that  you are exercising at a moderate intensity if you  have a higher heart rate and faster breathing, but you are still able to hold a conversation. You can tell that you are exercising at a vigorous intensity if you are breathing much harder and faster and cannot hold a conversation while exercising. How often should I exercise? Choose an activity that you enjoy and set realistic goals. Your health care provider can help you to make an activity plan that works for you. Exercise regularly as directed by your health care provider. This may include:  Doing resistance training twice each week, such as: ? Push-ups. ? Sit-ups. ? Lifting weights. ? Using resistance bands.  Doing a given intensity of exercise for a given amount of time. Choose from these options: ? 150 minutes of moderate-intensity exercise every week. ? 75 minutes of vigorous-intensity exercise every week. ? A mix of moderate-intensity and vigorous-intensity exercise every week.  Children, pregnant women, people who are out of shape, people who are overweight, and older adults may need to consult a health care provider for individual recommendations. If you have any sort of medical condition, be sure to consult your health care provider before starting a new exercise program. What are some exercise ideas? Some moderate-intensity exercise ideas include:  Walking at a rate of 1 mile in 15 minutes.  Biking.  Hiking.  Golfing.  Dancing.  Some vigorous-intensity exercise ideas include:  Walking at a rate of at least 4.5 miles per hour.  Jogging or running at a rate of 5 miles per hour.  Biking at a rate of at least 10 miles per hour.  Lap swimming.  Roller-skating or in-line skating.  Cross-country skiing.  Vigorous competitive sports, such as football, basketball, and soccer.  Jumping rope.  Aerobic dancing.  What are some everyday activities that can help me to get exercise?  [place] work, such as: ? Pushing a Cabin crew. ? Raking and bagging leaves.  Washing and waxing your car.  Pushing a stroller.  Shoveling snow.  Gardening.  Washing windows or floors. How can I be more active in my day-to-day activities?  Use the stairs instead of the elevator.  Take a walk during your lunch break.  If you drive, park your car farther away from work or school.  If you take public transportation, get off one stop early and walk the rest of the way.  Make all of your phone calls while standing up and walking around.  Get up, stretch, and walk around every 30 minutes throughout the day. What guidelines should I follow while exercising?  Do not exercise so much that you hurt yourself, feel dizzy, or get very short of breath.  Consult your health care provider before starting a new exercise program.  Wear comfortable clothes and shoes with good support.  Drink plenty of water while you exercise to prevent dehydration or heat stroke. Body water is lost during exercise and must be replaced.  Work out until you breathe faster and your heart beats faster. This information is not intended to replace advice given to you by your health care provider. Make sure you discuss any questions you have with your health care provider. Document Released: 05/19/2010 Document Revised: 09/22/2015 Document Reviewed: 09/17/2013 Elsevier Interactive Patient Education  [employer].

## 2017-01-30 ENCOUNTER — Telehealth: Payer: Self-pay | Admitting: Obstetrics & Gynecology

## 2017-01-30 NOTE — Telephone Encounter (Signed)
[facility] referring for Encounter for gynecological examination with Dr. Kenton Kingfisher. LVM for patient to callback to be schedule

## 2017-02-01 DIAGNOSIS — M65351 Trigger finger, right little finger: Secondary | ICD-10-CM | POA: Diagnosis not present

## 2017-02-05 NOTE — Telephone Encounter (Signed)
LVM for patient to call back to be schedule

## 2017-02-07 NOTE — Telephone Encounter (Signed)
LVM for patient to call back to be schedule.

## 2017-02-12 NOTE — Telephone Encounter (Signed)
Letter sent to [facility] about many attempts to schedule patiemt we unsuccessful

## 2017-02-16 ENCOUNTER — Ambulatory Visit (INDEPENDENT_AMBULATORY_CARE_PROVIDER_SITE_OTHER): Payer: BLUE CROSS/BLUE SHIELD

## 2017-02-16 DIAGNOSIS — Z23 Encounter for immunization: Secondary | ICD-10-CM | POA: Diagnosis not present

## 2017-02-21 ENCOUNTER — Encounter: Payer: BLUE CROSS/BLUE SHIELD | Admitting: Family Medicine

## 2017-03-01 ENCOUNTER — Encounter: Payer: Self-pay | Admitting: Family Medicine

## 2017-03-01 ENCOUNTER — Ambulatory Visit (INDEPENDENT_AMBULATORY_CARE_PROVIDER_SITE_OTHER): Payer: BLUE CROSS/BLUE SHIELD | Admitting: Family Medicine

## 2017-03-01 VITALS — BP 134/68 | HR 75 | Temp 98.1°F | Resp 16

## 2017-03-01 DIAGNOSIS — B9789 Other viral agents as the cause of diseases classified elsewhere: Secondary | ICD-10-CM | POA: Diagnosis not present

## 2017-03-01 DIAGNOSIS — J069 Acute upper respiratory infection, unspecified: Secondary | ICD-10-CM

## 2017-03-01 DIAGNOSIS — R52 Pain, unspecified: Secondary | ICD-10-CM | POA: Diagnosis not present

## 2017-03-01 LAB — POCT INFLUENZA A/B
INFLUENZA A, POC: NEGATIVE
Influenza B, POC: NEGATIVE

## 2017-03-01 MED ORDER — PSEUDOEPH-BROMPHEN-DM 30-2-10 MG/5ML PO SYRP: 5.0000 mL | ORAL_SOLUTION | Freq: Four times a day (QID) | ORAL | 0 refills | Status: DC | PRN

## 2017-03-01 NOTE — Patient Instructions (Signed)
Upper Respiratory Infection, Adult Most upper respiratory infections (URIs) are a viral infection of the air passages leading to the lungs. A URI affects the nose, throat, and upper air passages. The most common type of URI is nasopharyngitis and is typically referred to as "the common cold." URIs run their course and usually go away on their own. Most of the time, a URI does not require medical attention, but sometimes a bacterial infection in the upper airways can follow a viral infection. This is called a secondary infection. Sinus and middle ear infections are common types of secondary upper respiratory infections. Bacterial pneumonia can also complicate a URI. A URI can worsen asthma and chronic obstructive pulmonary disease (COPD). Sometimes, these complications can require emergency medical care and may be life threatening. What are the causes? Almost all URIs are caused by viruses. A virus is a type of germ and can spread from one person to another. What increases the risk? You may be at risk for a URI if:  You smoke.  You have chronic heart or lung disease.  You have a weakened defense (immune) system.  You are very young or very old.  You have nasal allergies or asthma.  You work in crowded or poorly ventilated areas.  You work in health care facilities or schools.  What are the signs or symptoms? Symptoms typically develop 2-3 days after you come in contact with a cold virus. Most viral URIs last 7-10 days. However, viral URIs from the influenza virus (flu virus) can last 14-18 days and are typically more severe. Symptoms may include:  Runny or stuffy (congested) nose.  Sneezing.  Cough.  Sore throat.  Headache.  Fatigue.  Fever.  Loss of appetite.  Pain in your forehead, behind your eyes, and over your cheekbones (sinus pain).  Muscle aches.  How is this diagnosed? Your health care provider may diagnose a URI by:  Physical exam.  Tests to check that your  symptoms are not due to another condition such as: ? Strep throat. ? Sinusitis. ? Pneumonia. ? Asthma.  How is this treated? A URI goes away on its own with time. It cannot be cured with medicines, but medicines may be prescribed or recommended to relieve symptoms. Medicines may help:  Reduce your fever.  Reduce your cough.  Relieve nasal congestion.  Follow these instructions at home:  Take medicines only as directed by your health care provider.  Gargle warm saltwater or take cough drops to comfort your throat as directed by your health care provider.  Use a warm mist humidifier or inhale steam from a shower to increase air moisture. This may make it easier to breathe.  Drink enough fluid to keep your urine clear or pale yellow.  Eat soups and other clear broths and maintain good nutrition.  Rest as needed.  Return to work when your temperature has returned to normal or as your health care provider advises. You may need to stay home longer to avoid infecting others. You can also use a face mask and careful hand washing to prevent spread of the virus.  Increase the usage of your inhaler if you have asthma.  Do not use any tobacco products, including cigarettes, chewing tobacco, or electronic cigarettes. If you need help quitting, ask your health care provider. How is this prevented? The best way to protect yourself from getting a cold is to practice good hygiene.  Avoid oral or hand contact with people with cold symptoms.  Wash your   hands often if contact occurs.  There is no clear evidence that vitamin C, vitamin E, echinacea, or exercise reduces the chance of developing a cold. However, it is always recommended to get plenty of rest, exercise, and practice good nutrition. Contact a health care provider if:  You are getting worse rather than better.  Your symptoms are not controlled by medicine.  You have chills.  You have worsening shortness of breath.  You have  brown or red mucus.  You have yellow or brown nasal discharge.  You have pain in your face, especially when you bend forward.  You have a fever.  You have swollen neck glands.  You have pain while swallowing.  You have white areas in the back of your throat. Get help right away if:  You have severe or persistent: ? Headache. ? Ear pain. ? Sinus pain. ? Chest pain.  You have chronic lung disease and any of the following: ? Wheezing. ? Prolonged cough. ? Coughing up blood. ? A change in your usual mucus.  You have a stiff neck.  You have changes in your: ? Vision. ? Hearing. ? Thinking. ? Mood. This information is not intended to replace advice given to you by your health care provider. Make sure you discuss any questions you have with your health care provider. Document Released: 10/10/2000 Document Revised: 12/18/2015 Document Reviewed: 07/22/2013 Elsevier Interactive Patient Education  2017 [employer]  

## 2017-03-01 NOTE — Progress Notes (Signed)
Patient: Sarah Marquez Female    DOB: 07-08-1955   61 y.o.   MRN: [id number] Visit Date: 03/01/2017  Today's Provider: Vernie Murders, PA   Chief Complaint  Patient presents with  . URI   Subjective:    URI   This is a new problem. The current episode started yesterday. The problem has been gradually worsening. There has been no fever (Pt is c/o chills and sweats, but has not checked her temperature.). Associated symptoms include coughing (dry; H/O asthma), headaches, joint pain ("achy"), a plugged ear sensation, rhinorrhea, sinus pain, sneezing and a sore throat. Pertinent negatives include no abdominal pain, chest pain, congestion, diarrhea, dysuria, ear pain, nausea, neck pain, rash, swollen glands, vomiting or wheezing. She has tried nothing for the symptoms.  Pt had her flu shot on 02/16/2017.  Patient Active Problem List   Diagnosis Date Noted  . Cough variant asthma 12/27/2015  . Dyspnea 11/14/2015  . OSA (obstructive sleep apnea) 11/10/2015  . Recurrent URI (upper respiratory infection) 11/10/2015  . Cough 11/10/2015  . Benign neoplasm of colon 01/19/2015  . Allergic rhinitis 01/19/2015  . Absolute anemia 01/19/2015  . Colon polyp 01/19/2015  . Ankle bruise 01/19/2015  . Dysmetabolic syndrome X 40/98/1191  . Disorder of esophagus 01/19/2015  . Edema 01/19/2015  . Esophagitis, reflux 01/19/2015  . Frontal sinusitis 01/19/2015  . BP (high blood pressure) 01/19/2015  . Anemia, iron deficiency 01/19/2015  . Iron deficiency anemia 01/19/2015  . Abnormal kidney function 01/19/2015  . Cervical pain 01/19/2015  . Dyspnea, paroxysmal nocturnal 01/19/2015  . Gastric ulcer 01/19/2015  . Hive 01/19/2015  . B12 deficiency 01/19/2015  . Vitamin D deficiency 01/19/2015  . Degenerative arthritis of finger 06/07/2014  . Benign paroxysmal positional nystagmus 03/09/2009  . HLD (hyperlipidemia) 11/03/2008  . Fam hx-ischem heart disease 06/26/2008  . History of tobacco  use 06/26/2008  . Essential (primary) hypertension 06/03/2008  . Extreme obesity 06/03/2008  . Apnea, sleep 06/03/2008  . Morbid obesity ([place]) 06/03/2008  . H/O total hysterectomy 04/30/1998   Past Surgical History:  Procedure Laterality Date  . ABDOMINAL HYSTERECTOMY    . APPENDECTOMY    . CARDIAC CATHETERIZATION    . LUMBAR DISC SURGERY     ruptured disc x 2  . TONSILLECTOMY AND ADENOIDECTOMY     Family History  Problem Relation Age of Onset  . Breast cancer Mother 25  . Heart attack Father   . Heart attack Brother    Allergies  Allergen Reactions  . Beclomethasone   . Sulfa Antibiotics   . Latex Rash  . Meloxicam Palpitations    Current Outpatient Prescriptions:  .  albuterol (PROAIR HFA) 108 (90 Base) MCG/ACT inhaler, Inhale into the lungs., Disp: , Rfl:  .  beclomethasone (QVAR) 80 MCG/ACT inhaler, Inhale 2 puffs into the lungs 2 (two) times daily., Disp: , Rfl:  .  Cholecalciferol (VITAMIN D-1000 MAX ST) 1000 UNITS tablet, Take by mouth., Disp: , Rfl:  .  DEXILANT 60 MG capsule, Take 1 capsule by mouth daily., Disp: , Rfl: 11 .  losartan (COZAAR) 50 MG tablet, TAKE 1 TABLET BY MOUTH EVERY DAY, Disp: 30 tablet, Rfl: 11 .  Magnesium 200 MG TABS, Take by mouth., Disp: , Rfl:  .  montelukast (SINGULAIR) 10 MG tablet, TAKE 1 TABLET (10 MG TOTAL) BY MOUTH DAILY., Disp: 30 tablet, Rfl: 2 .  NON FORMULARY, CPAP at night, Disp: , Rfl:  .  verapamil (CALAN-SR) 240  MG CR tablet, TAKE 1 TABLET BY MOUTH EVERY DAY, Disp: 90 tablet, Rfl: 3  Review of Systems  HENT: Positive for rhinorrhea, sinus pain, sneezing and sore throat. Negative for congestion and ear pain.   Respiratory: Positive for cough (dry; H/O asthma). Negative for wheezing.   Cardiovascular: Negative for chest pain.  Gastrointestinal: Negative for abdominal pain, diarrhea, nausea and vomiting.  Genitourinary: Negative for dysuria.  Musculoskeletal: Positive for joint pain ("achy"). Negative for neck pain.  Skin:  Negative for rash.  Neurological: Positive for headaches.    Social History  Substance Use Topics  . Smoking status: Never Smoker  . Smokeless tobacco: Never Used     Comment: former social smoker  . Alcohol use No   Objective:   BP 134/68 (BP Location: Right Arm, Patient Position: Sitting, Cuff Size: Large)   Pulse 75   Temp 98.1 F (36.7 C) (Oral)   Resp 16  Vitals:   03/01/17 1108  BP: 134/68  Pulse: 75  Resp: 16  Temp: 98.1 F (36.7 C)  TempSrc: Oral   Physical Exam  Constitutional: She is oriented to person, place, and time. She appears well-developed and well-nourished. No distress.  HENT:  Head: Normocephalic and atraumatic.  Right Ear: Hearing and external ear normal.  Left Ear: Hearing and external ear normal.  Nose: Nose normal.  Slight irritation/cobblestoning of posterior pharynx. No exudates. No sinus tenderness to palpation/percussion.  Eyes: Conjunctivae and lids are normal. Right eye exhibits no discharge. Left eye exhibits no discharge. No scleral icterus.  Neck: Neck supple.  Cardiovascular: Normal rate and regular rhythm.   Pulmonary/Chest: Effort normal and breath sounds normal. No respiratory distress.  Abdominal: Soft. Bowel sounds are normal.  Musculoskeletal: Normal range of motion.  Lymphadenopathy:    She has no cervical adenopathy.  Neurological: She is alert and oriented to person, place, and time.  Skin: Skin is intact. No lesion and no rash noted.  Psychiatric: She has a normal mood and affect. Her speech is normal and behavior is normal. Thought content normal.      Assessment & Plan:     1. Body aches Onset yesterday with sore throat, congestion and cough. Flu test negative. Increase fluid intake and may use Tylenol or Advil prn. Recheck if no better in 5-7 days. - POCT Influenza A/B  2. Viral URI with cough Symptoms as above without fever today. Gargle with warm saltwater for sore throat and given Bromfed-DM for cough. Recommend  using Flonase or Nasonex with Singulair for any allergy symptoms. Recheck if no better in 5-7 days. - brompheniramine-pseudoephedrine-DM 30-2-10 MG/5ML syrup; Take 5 mLs by mouth 4 (four) times daily as needed.  Dispense: 120 mL; Refill: 0       Vernie Murders, PA  [facility]

## 2017-03-27 ENCOUNTER — Other Ambulatory Visit: Payer: Self-pay | Admitting: Family Medicine

## 2017-03-27 MED ORDER — LOSARTAN POTASSIUM 50 MG PO TABS: 50.0000 mg | ORAL_TABLET | Freq: Every day | ORAL | 2 refills | Status: DC

## 2017-03-27 NOTE — Telephone Encounter (Signed)
[facility] faxed a refill request on the following medications:  losartan (COZAAR) 50 MG tablet.  90 day supply.    [facility].   This is a Dr Rosanna Randy pt/MW

## 2017-06-06 ENCOUNTER — Ambulatory Visit: Payer: BLUE CROSS/BLUE SHIELD | Admitting: Family Medicine

## 2017-06-17 ENCOUNTER — Other Ambulatory Visit: Payer: Self-pay | Admitting: Family Medicine

## 2017-06-30 ENCOUNTER — Other Ambulatory Visit: Payer: Self-pay | Admitting: Physician Assistant

## 2017-07-03 ENCOUNTER — Ambulatory Visit: Payer: BLUE CROSS/BLUE SHIELD | Admitting: Family Medicine

## 2017-07-03 ENCOUNTER — Encounter: Payer: Self-pay | Admitting: Family Medicine

## 2017-07-03 VITALS — BP 120/64 | HR 64 | Temp 97.8°F | Resp 16 | Wt 288.0 lb

## 2017-07-03 DIAGNOSIS — I10 Essential (primary) hypertension: Secondary | ICD-10-CM | POA: Diagnosis not present

## 2017-07-03 DIAGNOSIS — M25562 Pain in left knee: Secondary | ICD-10-CM | POA: Diagnosis not present

## 2017-07-03 DIAGNOSIS — Z1211 Encounter for screening for malignant neoplasm of colon: Secondary | ICD-10-CM

## 2017-07-03 DIAGNOSIS — K2100 Gastro-esophageal reflux disease with esophagitis, without bleeding: Secondary | ICD-10-CM

## 2017-07-03 DIAGNOSIS — M25561 Pain in right knee: Secondary | ICD-10-CM | POA: Diagnosis not present

## 2017-07-03 DIAGNOSIS — M170 Bilateral primary osteoarthritis of knee: Secondary | ICD-10-CM | POA: Diagnosis not present

## 2017-07-03 DIAGNOSIS — D508 Other iron deficiency anemias: Secondary | ICD-10-CM

## 2017-07-03 NOTE — Progress Notes (Signed)
Patient: Sarah Marquez Female    DOB: 1955-11-27   62 y.o.   MRN: [id number] Visit Date: 07/03/2017  Today's Provider: Wilhemena Durie, MD   Chief Complaint  Patient presents with  . Hypertension  . Gastroesophageal Reflux  . Anemia   Subjective:    Hypertension  This is a chronic problem. The problem is controlled. Associated symptoms include peripheral edema (Only at the end of the day.). Pertinent negatives include no anxiety, blurred vision, chest pain, headaches, malaise/fatigue, neck pain, orthopnea, palpitations, PND, shortness of breath or sweats. There are no associated agents to hypertension. There are no compliance problems.   Gastroesophageal Reflux  She reports no abdominal pain or no chest pain. This is a chronic problem. The problem has been unchanged. Pertinent negatives include no weight loss.  Anemia  Presents for follow-up visit. There has been no abdominal pain, anorexia, bruising/bleeding easily, fever, leg swelling, light-headedness, malaise/fatigue, palpitations, pica or weight loss.    BP Readings from Last 3 Encounters:  07/03/17 120/64  03/01/17 134/68  01/28/17 118/64       Allergies  Allergen Reactions  . Beclomethasone   . Sulfa Antibiotics   . Latex Rash  . Meloxicam Palpitations     Current Outpatient Medications:  .  albuterol (PROAIR HFA) 108 (90 Base) MCG/ACT inhaler, Inhale into the lungs., Disp: , Rfl:  .  beclomethasone (QVAR) 80 MCG/ACT inhaler, Inhale 2 puffs into the lungs 2 (two) times daily., Disp: , Rfl:  .  Cholecalciferol (VITAMIN D-1000 MAX ST) 1000 UNITS tablet, Take by mouth., Disp: , Rfl:  .  DEXILANT 60 MG capsule, Take 1 capsule by mouth daily., Disp: , Rfl: 11 .  losartan (COZAAR) 50 MG tablet, TAKE 1 TABLET BY MOUTH EVERY DAY, Disp: 30 tablet, Rfl: 2 .  Magnesium 200 MG TABS, Take by mouth., Disp: , Rfl:  .  montelukast (SINGULAIR) 10 MG tablet, TAKE 1 TABLET (10 MG TOTAL) BY MOUTH DAILY., Disp: 30 tablet,  Rfl: 2 .  NON FORMULARY, CPAP at night, Disp: , Rfl:  .  verapamil (CALAN-SR) 240 MG CR tablet, TAKE 1 TABLET BY MOUTH EVERY DAY, Disp: 90 tablet, Rfl: 3 .  brompheniramine-pseudoephedrine-DM 30-2-10 MG/5ML syrup, Take 5 mLs by mouth 4 (four) times daily as needed., Disp: 120 mL, Rfl: 0  Review of Systems  Constitutional: Negative.  Negative for fever, malaise/fatigue and weight loss.  Eyes: Negative for blurred vision.  Respiratory: Negative.  Negative for shortness of breath.   Cardiovascular: Negative.  Negative for chest pain, palpitations, orthopnea and PND.  Gastrointestinal: Negative.  Negative for abdominal pain and anorexia.  Musculoskeletal: Negative.  Negative for neck pain.  Neurological: Negative for dizziness, light-headedness and headaches.  Hematological: Does not bruise/bleed easily.    Social History   Tobacco Use  . Smoking status: Never Smoker  . Smokeless tobacco: Never Used  . Tobacco comment: former social smoker  Substance Use Topics  . Alcohol use: No    Alcohol/week: 0.0 oz   Objective:   BP 120/64 (BP Location: Left Arm, Patient Position: Sitting, Cuff Size: Large)   Pulse 64   Temp 97.8 F (36.6 C) (Oral)   Resp 16   Wt 288 lb (130.6 kg)   BMI 47.93 kg/m  Vitals:   07/03/17 0808  BP: 120/64  Pulse: 64  Resp: 16  Temp: 97.8 F (36.6 C)  TempSrc: Oral  Weight: 288 lb (130.6 kg)     Physical Exam  Constitutional: She is oriented to person, place, and time. She appears well-developed and well-nourished.  HENT:  Head: Normocephalic and atraumatic.  Eyes: Conjunctivae are normal. No scleral icterus.  Neck: No thyromegaly present.  Cardiovascular: Normal rate, regular rhythm, normal heart sounds and intact distal pulses.  Pulmonary/Chest: Effort normal and breath sounds normal.  Abdominal: Soft.  Lymphadenopathy:    She has no cervical adenopathy.  Neurological: She is alert and oriented to person, place, and time.  Skin: Skin is warm  and dry.  Psychiatric: She has a normal mood and affect. Her behavior is normal. Judgment and thought content normal.        Assessment & Plan:       1. Essential (primary) hypertension Blood pressure stable continue medications.  Recheck in 6 months.    2. Other iron deficiency anemia Has been off Iron since April, will recheck today.   3. Esophagitis, reflux Stable on Dexilant.  Continue current medications.  She has tried in the past to stop and symptoms returned.   4.  Colon Cancer Screening.   Referral to GI for screening colonoscopy.   5.Obesity  Patient seen and examined by Miguel Aschoff, MD, and note scribed by Ashley Royalty, CMA I have done the exam and reviewed the above chart and it is accurate to the best of my knowledge. Development worker, community has been used in this note in any air is in the dictation or transcription are unintentional.  Wilhemena Durie, MD  Man

## 2017-07-04 ENCOUNTER — Telehealth: Payer: Self-pay | Admitting: Emergency Medicine

## 2017-07-04 LAB — CBC WITH DIFFERENTIAL/PLATELET
Basophils Absolute: 0 10*3/uL (ref 0.0–0.2)
Basos: 0 %
EOS (ABSOLUTE): 0.6 10*3/uL — ABNORMAL HIGH (ref 0.0–0.4)
EOS: 8 %
HEMATOCRIT: 41.5 % (ref 34.0–46.6)
HEMOGLOBIN: 13.2 g/dL (ref 11.1–15.9)
Immature Grans (Abs): 0 10*3/uL (ref 0.0–0.1)
Immature Granulocytes: 0 %
LYMPHS ABS: 1.7 10*3/uL (ref 0.7–3.1)
Lymphs: 23 %
MCH: 28.3 pg (ref 26.6–33.0)
MCHC: 31.8 g/dL (ref 31.5–35.7)
MCV: 89 fL (ref 79–97)
MONOS ABS: 0.6 10*3/uL (ref 0.1–0.9)
Monocytes: 7 %
NEUTROS ABS: 4.7 10*3/uL (ref 1.4–7.0)
Neutrophils: 62 %
Platelets: 212 10*3/uL (ref 150–379)
RBC: 4.67 x10E6/uL (ref 3.77–5.28)
RDW: 14.6 % (ref 12.3–15.4)
WBC: 7.6 10*3/uL (ref 3.4–10.8)

## 2017-07-04 LAB — IRON: Iron: 64 ug/dL (ref 27–139)

## 2017-07-04 NOTE — Telephone Encounter (Signed)
-----   Message from Jerrol Banana., MD sent at 07/04/2017 12:41 PM EST ----- Good-no iron needed.

## 2017-07-04 NOTE — Telephone Encounter (Signed)
LMTCB home and cell.

## 2017-07-05 NOTE — Telephone Encounter (Signed)
Pt returned call for her results. Pt requested that since she is at work that someone call her cell phone and if she doesn't answer to please leave the results on her voicemail. Please advise. Thanks TNP

## 2017-07-05 NOTE — Telephone Encounter (Signed)
Pt advised, and acknowledges understanding. 

## 2017-07-27 ENCOUNTER — Other Ambulatory Visit: Payer: Self-pay | Admitting: Family Medicine

## 2017-08-21 DIAGNOSIS — K635 Polyp of colon: Secondary | ICD-10-CM | POA: Diagnosis not present

## 2017-08-21 DIAGNOSIS — R1013 Epigastric pain: Secondary | ICD-10-CM | POA: Diagnosis not present

## 2017-08-30 ENCOUNTER — Other Ambulatory Visit: Payer: Self-pay | Admitting: Family Medicine

## 2017-09-04 ENCOUNTER — Telehealth: Payer: Self-pay | Admitting: Internal Medicine

## 2017-09-04 ENCOUNTER — Encounter: Payer: Self-pay | Admitting: Internal Medicine

## 2017-09-04 NOTE — Telephone Encounter (Signed)
3 attempts to schedule fu appt from recall list.   Deleting recall.  °Mailed Letter  °

## 2017-09-12 ENCOUNTER — Other Ambulatory Visit: Payer: Self-pay | Admitting: Family Medicine

## 2017-09-12 DIAGNOSIS — Z1231 Encounter for screening mammogram for malignant neoplasm of breast: Secondary | ICD-10-CM

## 2017-09-13 IMAGING — MG MM DIGITAL SCREENING BILAT W/ CAD
5 series · 5 of 5 positions shown · non-contrast
Comparison: Previous exam(s).

ACR Breast Density Category a: The breast tissue is almost entirely
fatty.

CLINICAL DATA: Screening.

EXAM:
DIGITAL SCREENING BILATERAL MAMMOGRAM WITH CAD

[R MLO (1 of 2)]
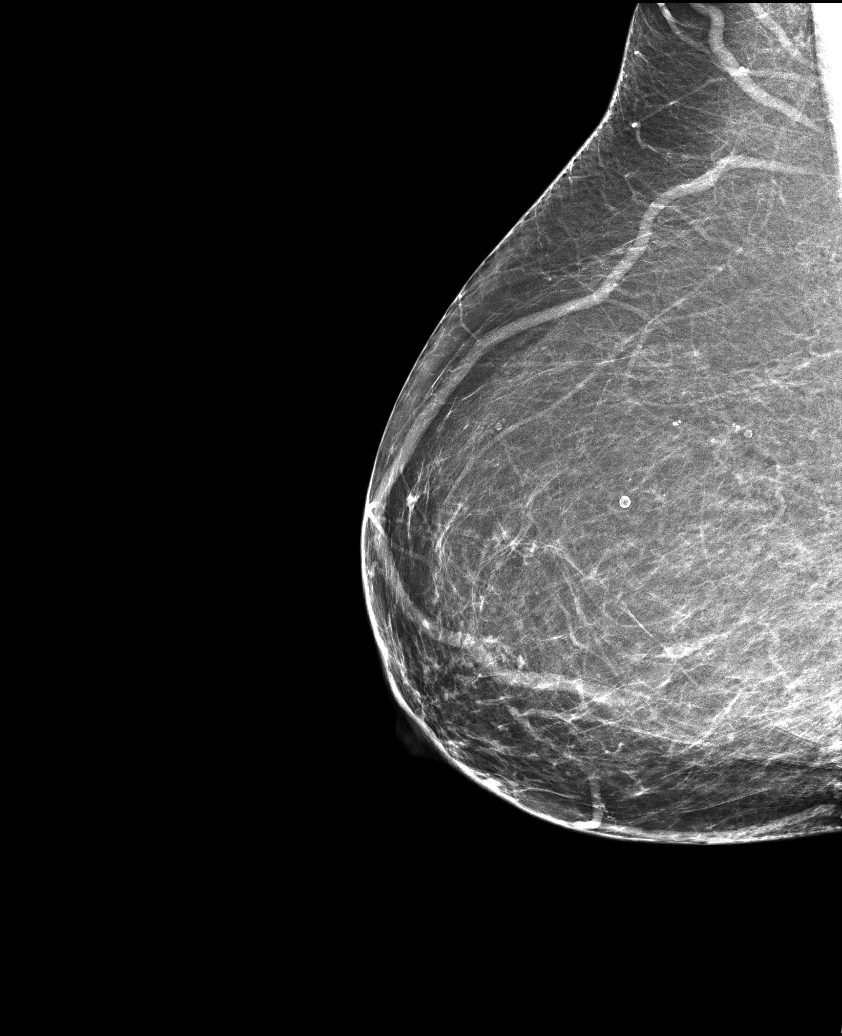

[L CC]
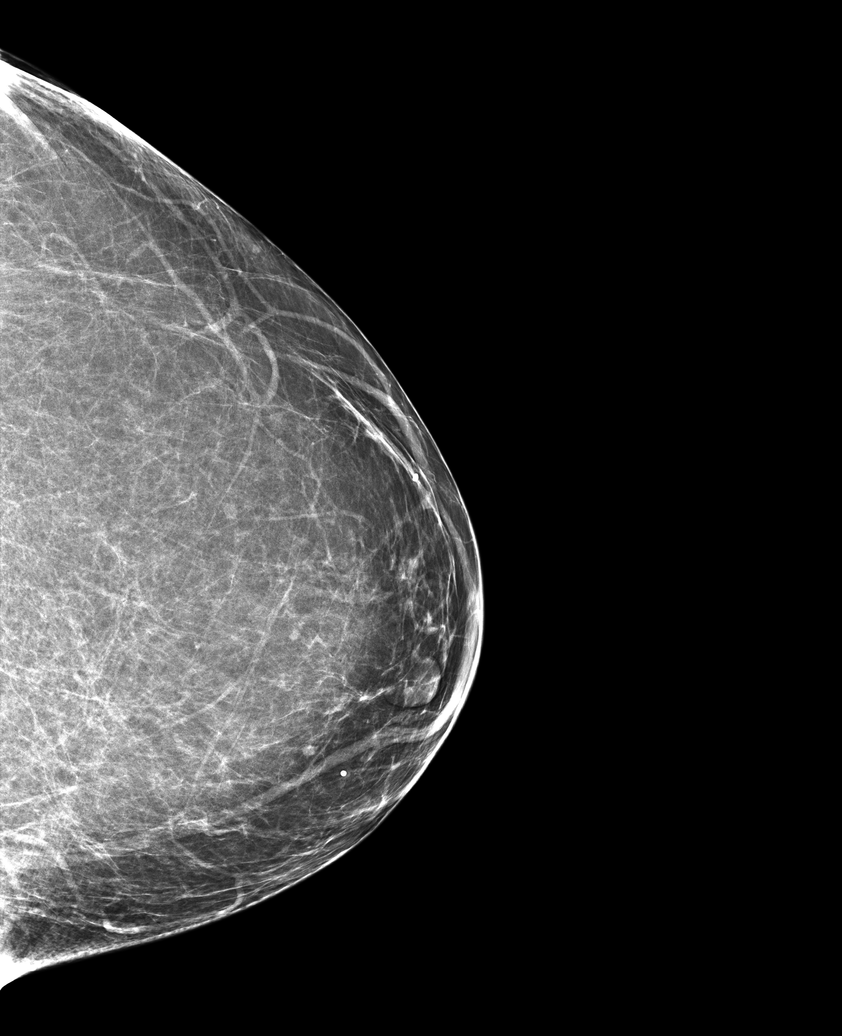

[R CC]
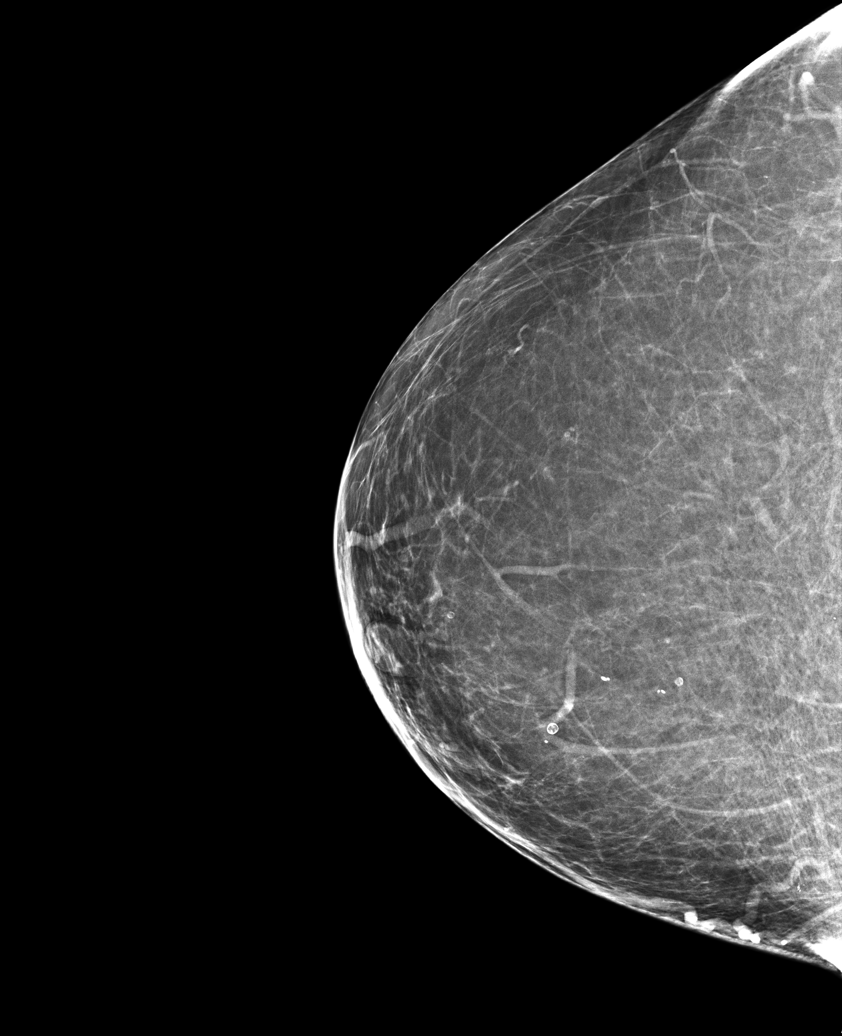

[L MLO]
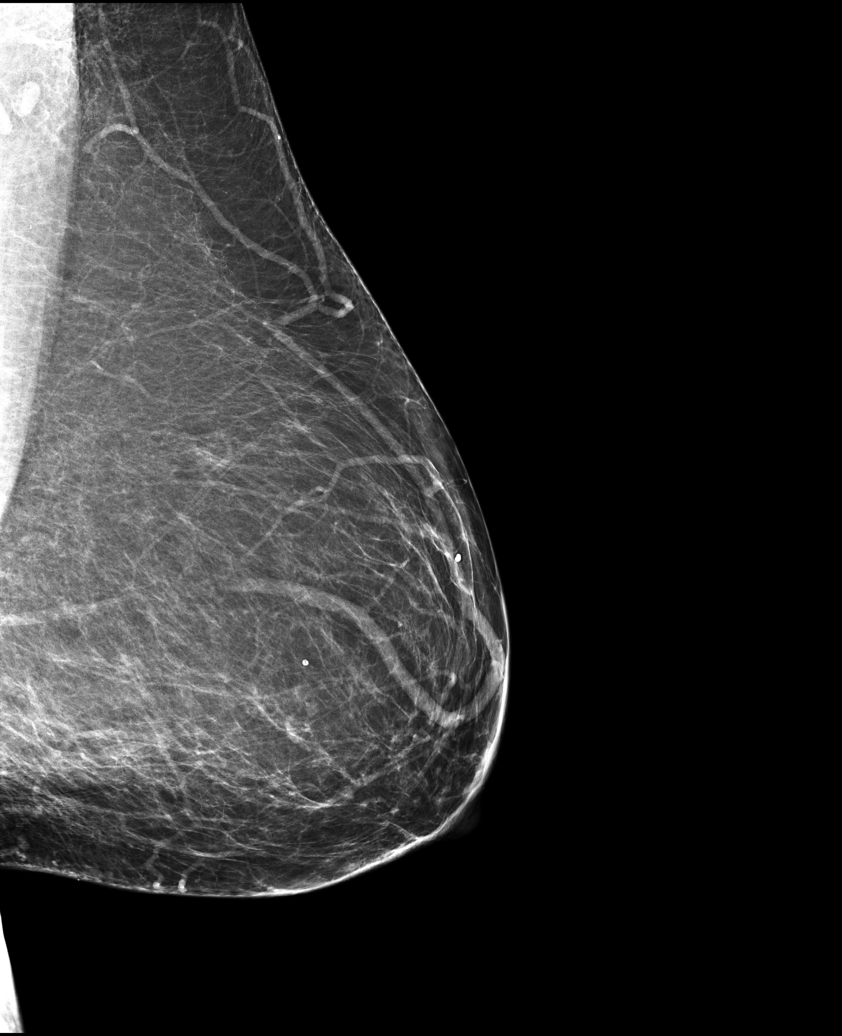

[R MLO (2 of 2)]
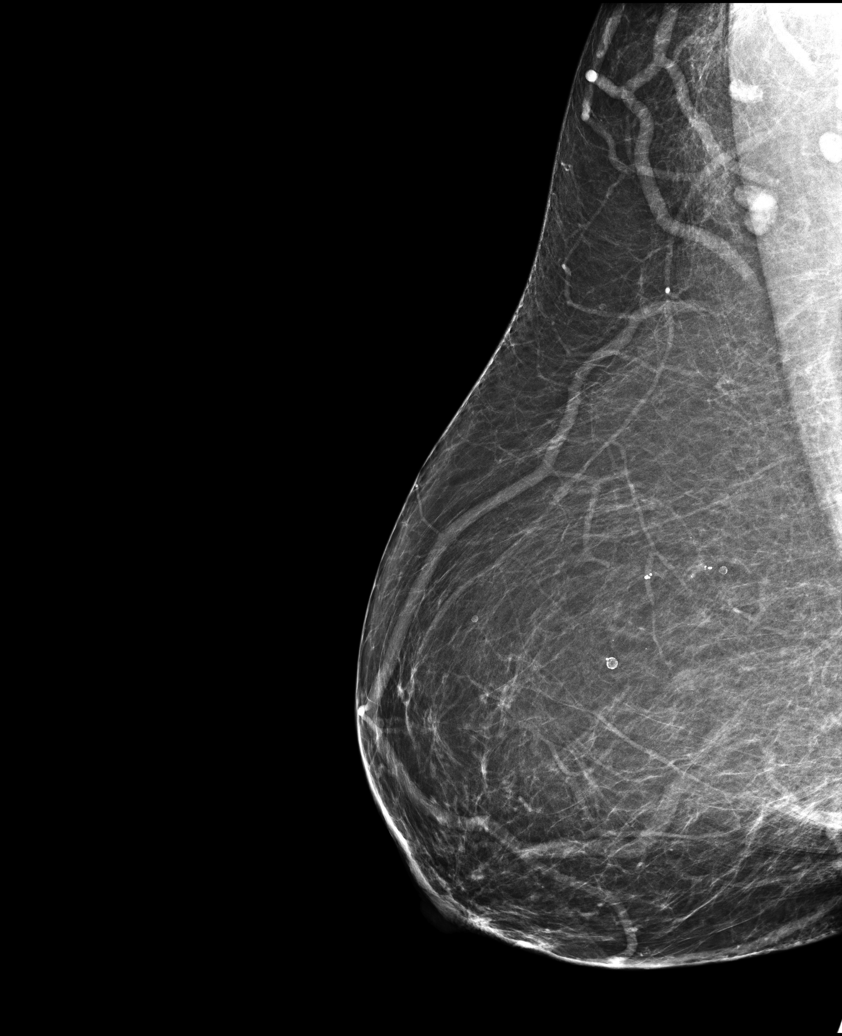

[5 of 5 positions shown; findings below may reference images not displayed]

FINDINGS: There are no findings suspicious for malignancy. Images were
processed with CAD.
IMPRESSION: No mammographic evidence of malignancy. A result letter of this
screening mammogram will be mailed directly to the patient.

RECOMMENDATION:
Screening mammogram in one year. (Code:[id number])

BI-RADS CATEGORY  1: Negative.

## 2017-09-30 ENCOUNTER — Ambulatory Visit
Admission: RE | Admit: 2017-09-30 | Discharge: 2017-09-30 | Disposition: A | Discharge status: Home / Self Care | Payer: BLUE CROSS/BLUE SHIELD | Source: Ambulatory Visit | Attending: Family Medicine | Admitting: Family Medicine

## 2017-09-30 DIAGNOSIS — Z1231 Encounter for screening mammogram for malignant neoplasm of breast: Secondary | ICD-10-CM | POA: Diagnosis not present

## 2017-10-16 ENCOUNTER — Ambulatory Visit: Payer: BLUE CROSS/BLUE SHIELD | Admitting: Anesthesiology

## 2017-10-16 ENCOUNTER — Encounter
Admission: RE | Disposition: A | Discharge status: Home / Self Care | Payer: Self-pay | Source: Ambulatory Visit | Attending: Unknown Physician Specialty

## 2017-10-16 ENCOUNTER — Other Ambulatory Visit: Payer: Self-pay

## 2017-10-16 ENCOUNTER — Ambulatory Visit
Admission: RE | Admit: 2017-10-16 | Discharge: 2017-10-16 | Disposition: A | Discharge status: Home / Self Care | Payer: BLUE CROSS/BLUE SHIELD | Source: Ambulatory Visit | Attending: Unknown Physician Specialty | Admitting: Unknown Physician Specialty

## 2017-10-16 ENCOUNTER — Encounter: Payer: Self-pay | Admitting: Anesthesiology

## 2017-10-16 DIAGNOSIS — E785 Hyperlipidemia, unspecified: Secondary | ICD-10-CM | POA: Diagnosis not present

## 2017-10-16 DIAGNOSIS — Z86010 Personal history of colonic polyps: Secondary | ICD-10-CM | POA: Diagnosis not present

## 2017-10-16 DIAGNOSIS — R1013 Epigastric pain: Secondary | ICD-10-CM | POA: Diagnosis not present

## 2017-10-16 DIAGNOSIS — Z1211 Encounter for screening for malignant neoplasm of colon: Secondary | ICD-10-CM | POA: Diagnosis not present

## 2017-10-16 DIAGNOSIS — Z79899 Other long term (current) drug therapy: Secondary | ICD-10-CM | POA: Insufficient documentation

## 2017-10-16 DIAGNOSIS — K3189 Other diseases of stomach and duodenum: Secondary | ICD-10-CM | POA: Diagnosis not present

## 2017-10-16 DIAGNOSIS — K2950 Unspecified chronic gastritis without bleeding: Secondary | ICD-10-CM | POA: Insufficient documentation

## 2017-10-16 DIAGNOSIS — K2970 Gastritis, unspecified, without bleeding: Secondary | ICD-10-CM | POA: Diagnosis not present

## 2017-10-16 DIAGNOSIS — I10 Essential (primary) hypertension: Secondary | ICD-10-CM | POA: Diagnosis not present

## 2017-10-16 DIAGNOSIS — G4730 Sleep apnea, unspecified: Secondary | ICD-10-CM | POA: Insufficient documentation

## 2017-10-16 DIAGNOSIS — Z6841 Body Mass Index (BMI) 40.0 and over, adult: Secondary | ICD-10-CM | POA: Diagnosis not present

## 2017-10-16 DIAGNOSIS — K640 First degree hemorrhoids: Secondary | ICD-10-CM | POA: Insufficient documentation

## 2017-10-16 DIAGNOSIS — K648 Other hemorrhoids: Secondary | ICD-10-CM | POA: Diagnosis not present

## 2017-10-16 DIAGNOSIS — Z7951 Long term (current) use of inhaled steroids: Secondary | ICD-10-CM | POA: Diagnosis not present

## 2017-10-16 DIAGNOSIS — K219 Gastro-esophageal reflux disease without esophagitis: Secondary | ICD-10-CM | POA: Diagnosis not present

## 2017-10-16 HISTORY — DX: Gastric ulcer, unspecified as acute or chronic, without hemorrhage or perforation: K25.9

## 2017-10-16 HISTORY — PX: ESOPHAGOGASTRODUODENOSCOPY (EGD) WITH PROPOFOL: SHX5813

## 2017-10-16 HISTORY — DX: Iron deficiency anemia, unspecified: D50.9

## 2017-10-16 HISTORY — DX: Essential (primary) hypertension: I10

## 2017-10-16 HISTORY — PX: COLONOSCOPY WITH PROPOFOL: SHX5780

## 2017-10-16 HISTORY — DX: Polyp of colon: K63.5

## 2017-10-16 HISTORY — DX: Failed or difficult intubation, initial encounter: T88.4XXA

## 2017-10-16 HISTORY — DX: Gastro-esophageal reflux disease without esophagitis: K21.9

## 2017-10-16 HISTORY — DX: Carpal tunnel syndrome, unspecified upper limb: G56.00

## 2017-10-16 HISTORY — DX: Hyperlipidemia, unspecified: E78.5

## 2017-10-16 SURGERY — ESOPHAGOGASTRODUODENOSCOPY (EGD) WITH PROPOFOL
Anesthesia: General

## 2017-10-16 MED ORDER — PROPOFOL 10 MG/ML IV BOLUS
INTRAVENOUS | Status: DC | PRN
  Administered 2017-10-16: 20 mg via INTRAVENOUS
  Administered 2017-10-16: 50 mg via INTRAVENOUS

## 2017-10-16 MED ORDER — PROPOFOL 500 MG/50ML IV EMUL
INTRAVENOUS | Status: DC | PRN
  Administered 2017-10-16: 100 ug/kg/min via INTRAVENOUS

## 2017-10-16 MED ORDER — SODIUM CHLORIDE 0.9 % IV SOLN
INTRAVENOUS | Status: DC
  Administered 2017-10-16 (×2): via INTRAVENOUS

## 2017-10-16 MED ORDER — SODIUM CHLORIDE 0.9 % IV SOLN: INTRAVENOUS | Status: DC

## 2017-10-16 MED ORDER — PROPOFOL 500 MG/50ML IV EMUL
INTRAVENOUS | Status: AC
  Filled 2017-10-16: qty 50

## 2017-10-16 NOTE — Anesthesia Preprocedure Evaluation (Addendum)
Anesthesia Evaluation  Patient identified by MRN, date of birth, ID band Patient awake    Reviewed: Allergy & Precautions, H&P , NPO status , Patient's Chart, lab work & pertinent test results, reviewed documented beta blocker date and time   History of Anesthesia Complications (+) DIFFICULT AIRWAY and history of anesthetic complications  Airway Mallampati: II  TM Distance: >3 FB Neck ROM: full    Dental  (+) Dental Advidsory Given, Teeth Intact, Partial Upper, Missing   Pulmonary neg shortness of breath, asthma , sleep apnea , neg recent URI,           Cardiovascular Exercise Tolerance: Good hypertension, (-) angina(-) CAD, (-) Past MI, (-) Cardiac Stents and (-) CABG (-) dysrhythmias (-) Valvular Problems/Murmurs     Neuro/Psych negative neurological ROS  negative psych ROS   GI/Hepatic Neg liver ROS, PUD, GERD  ,  Endo/Other  neg diabetesMorbid obesity  Renal/GU negative Renal ROS  negative genitourinary   Musculoskeletal   Abdominal   Peds  Hematology  (+) anemia ,   Anesthesia Other Findings Past Medical History: No date: Carpal tunnel syndrome No date: Colon polyps No date: Difficult intubation No date: Gastric ulcer No date: GERD (gastroesophageal reflux disease) No date: Hyperlipidemia No date: Hypertension No date: Iron deficiency anemia   Reproductive/Obstetrics negative OB ROS                            Anesthesia Physical Anesthesia Plan  ASA: III  Anesthesia Plan: General   Post-op Pain Management:    Induction: Intravenous  PONV Risk Score and Plan: 3 and Propofol infusion  Airway Management Planned: Nasal Cannula  Additional Equipment:   Intra-op Plan:   Post-operative Plan:   Informed Consent: I have reviewed the patients History and Physical, chart, labs and discussed the procedure including the risks, benefits and alternatives for the proposed  anesthesia with the patient or authorized representative who has indicated his/her understanding and acceptance.   Dental Advisory Given  Plan Discussed with: Anesthesiologist, CRNA and Surgeon  Anesthesia Plan Comments:         Anesthesia Quick Evaluation

## 2017-10-16 NOTE — Op Note (Signed)
[facility] Patient Name: Sarah Marquez Procedure Date: 10/16/2017 8:58 AM MRN: [id number] Account #: [id number] Date of Birth: February 17, 1956 Admit Type: Outpatient Age: 62 Room: [facility] ENDO ROOM 3 Gender: Female Note Status: Finalized Procedure:            Upper GI endoscopy Indications:          Epigastric abdominal pain Providers:            Manya Silvas, MD Referring MD:         Janine Ores. Rosanna Randy, MD (Referring MD) Medicines:            Propofol per Anesthesia Complications:        No immediate complications. Procedure:            Pre-Anesthesia Assessment:                       - After reviewing the risks and benefits, the patient                        was deemed in satisfactory condition to undergo the                        procedure.                       - After reviewing the risks and benefits, the patient                        was deemed in satisfactory condition to undergo the                        procedure.                       After obtaining informed consent, the endoscope was                        passed under direct vision. Throughout the procedure,                        the patient's blood pressure, pulse, and oxygen                        saturations were monitored continuously. The Endoscope                        was introduced through the mouth, and advanced to the                        second part of duodenum. The upper GI endoscopy was                        accomplished without difficulty. The patient tolerated                        the procedure well. Findings:      The examined esophagus was normal. GEJ 40cm.      Patchy moderate inflammation characterized by erythema and granularity       was found in the gastric antrum. Biopsies were taken with a cold forceps       for histology.  Biopsies were taken with a cold forceps for Helicobacter       pylori testing.      The examined duodenum was normal. Bulb and  second portion. Impression:           - Normal esophagus.                       - Gastritis. Biopsied.                       - Normal examined duodenum. Recommendation:       - Await pathology results. Manya Silvas, MD 10/16/2017 9:41:19 AM This report has been signed electronically. Number of Addenda: 0 Note Initiated On: 10/16/2017 8:58 AM      [facility]

## 2017-10-16 NOTE — Transfer of Care (Signed)
Immediate Anesthesia Transfer of Care Note  Patient: Sarah Marquez  Procedure(s) Performed: ESOPHAGOGASTRODUODENOSCOPY (EGD) WITH PROPOFOL (N/A ) COLONOSCOPY WITH PROPOFOL (N/A )  Patient Location: PACU and Endoscopy Unit  Anesthesia Type:General  Level of Consciousness: awake, alert  and oriented  Airway & Oxygen Therapy: Patient Spontanous Breathing  Post-op Assessment: Report given to RN and Post -op Vital signs reviewed and stable  Post vital signs: Reviewed and stable  Last Vitals:  Vitals Value Taken Time  BP 137/81 10/16/2017  9:56 AM  Temp    Pulse 83 10/16/2017  9:57 AM  Resp 28 10/16/2017  9:57 AM  SpO2 97 % 10/16/2017  9:57 AM  Vitals shown include unvalidated device data.  Last Pain:  Vitals:   10/16/17 0901  PainSc: 0-No pain         Complications: No apparent anesthesia complications

## 2017-10-16 NOTE — Anesthesia Postprocedure Evaluation (Signed)
Anesthesia Post Note  Patient: Sarah Marquez  Procedure(s) Performed: ESOPHAGOGASTRODUODENOSCOPY (EGD) WITH PROPOFOL (N/A ) COLONOSCOPY WITH PROPOFOL (N/A )  Patient location during evaluation: Endoscopy Anesthesia Type: General Level of consciousness: awake and alert Pain management: pain level controlled Vital Signs Assessment: post-procedure vital signs reviewed and stable Respiratory status: spontaneous breathing, nonlabored ventilation, respiratory function stable and patient connected to nasal cannula oxygen Cardiovascular status: blood pressure returned to baseline and stable Postop Assessment: no apparent nausea or vomiting Anesthetic complications: no     Last Vitals:  Vitals:   10/16/17 1000 10/16/17 1010  BP: (!) 108/97 (!) 156/70  Pulse: 83 65  Resp: (!) 26 17  Temp:    SpO2: 96% 97%    Last Pain:  Vitals:   10/16/17 0901  PainSc: 0-No pain                 Martha Clan

## 2017-10-16 NOTE — H&P (Signed)
Primary Care Physician:  Jerrol Banana., MD Primary Gastroenterologist:  Dr. Vira Agar  Pre-Procedure History & Physical: HPI:  Sarah Marquez is a 62 y.o. female is here for an endoscopy and colonoscopy.  Done for epigastric pain and PH colon polyps   Past Medical History:  Diagnosis Date  . Carpal tunnel syndrome   . Colon polyps   . Difficult intubation   . Gastric ulcer   . GERD (gastroesophageal reflux disease)   . Hyperlipidemia   . Hypertension   . Iron deficiency anemia     Past Surgical History:  Procedure Laterality Date  . ABDOMINAL HYSTERECTOMY    . APPENDECTOMY    . CARDIAC CATHETERIZATION    . CHOLECYSTECTOMY    . COLONOSCOPY    . ESOPHAGOGASTRODUODENOSCOPY    . FUNCTIONAL ENDOSCOPIC SINUS SURGERY    . knee arthoscopy    . LUMBAR DISC SURGERY     ruptured disc x 2  . TONSILLECTOMY AND ADENOIDECTOMY      Prior to Admission medications   Medication Sig Start Date End Date Taking? Authorizing Provider  albuterol (PROAIR HFA) 108 (90 Base) MCG/ACT inhaler Inhale into the lungs. 08/28/15   [provider]  beclomethasone (QVAR) 80 MCG/ACT inhaler Inhale 2 puffs into the lungs 2 (two) times daily.    [provider]  brompheniramine-pseudoephedrine-DM 30-2-10 MG/5ML syrup Take 5 mLs by mouth 4 (four) times daily as needed. 03/01/17   Chrismon, Vickki Muff, PA  Cholecalciferol (VITAMIN D-1000 MAX ST) 1000 UNITS tablet Take by mouth.    [provider]  DEXILANT 60 MG capsule Take 1 capsule by mouth daily. 09/04/15   [provider]  losartan (COZAAR) 50 MG tablet TAKE 1 TABLET BY MOUTH EVERY DAY 08/30/17   Jerrol Banana., MD  Magnesium 200 MG TABS Take by mouth.    [provider]  montelukast (SINGULAIR) 10 MG tablet TAKE 1 TABLET (10 MG TOTAL) BY MOUTH DAILY. 02/08/16   Mungal, Roxanne Mins, MD  NON FORMULARY CPAP at night    [provider]  verapamil (CALAN-SR) 240 MG CR tablet TAKE 1 TABLET BY MOUTH EVERY  DAY 06/17/17   Jerrol Banana., MD    Allergies as of 09/30/2017 - Review Complete 07/03/2017  Allergen Reaction Noted  . Beclomethasone  01/19/2015  . Sulfa antibiotics  01/19/2015  . Latex Rash 01/19/2015  . Meloxicam Palpitations 10/06/2015    Family History  Problem Relation Age of Onset  . Breast cancer Mother 23  . Heart attack Father   . Heart attack Brother     Social History   Socioeconomic History  . Marital status: Married    Spouse name: Not on file  . Number of children: Not on file  . Years of education: Not on file  . Highest education level: Not on file  Occupational History  . Not on file  Social Needs  . Financial resource strain: Not on file  . Food insecurity:    Worry: Not on file    Inability: Not on file  . Transportation needs:    Medical: Not on file    Non-medical: Not on file  Tobacco Use  . Smoking status: Never Smoker  . Smokeless tobacco: Never Used  . Tobacco comment: former social smoker  Substance and Sexual Activity  . Alcohol use: No    Alcohol/week: 0.0 oz  . Drug use: No  . Sexual activity: Not on file  Lifestyle  .  Physical activity:    Days per week: Not on file    Minutes per session: Not on file  . Stress: Not on file  Relationships  . Social connections:    Talks on phone: Not on file    Gets together: Not on file    Attends religious service: Not on file    Active member of club or organization: Not on file    Attends meetings of clubs or organizations: Not on file    Relationship status: Not on file  . Intimate partner violence:    Fear of current or ex partner: Not on file    Emotionally abused: Not on file    Physically abused: Not on file    Forced sexual activity: Not on file  Other Topics Concern  . Not on file  Social History Narrative  . Not on file    Review of Systems: See HPI, otherwise negative ROS  Physical Exam: BP (!) 179/86   Pulse 62   Temp (!) 96.5 F (35.8 C)   Resp 18    Ht 5\' 5"  (1.651 m)   Wt 124.7 kg (275 lb)   SpO2 97%   BMI 45.76 kg/m  General:   Alert,  pleasant and cooperative in NAD Head:  Normocephalic and atraumatic. Neck:  Supple; no masses or thyromegaly. Lungs:  Clear throughout to auscultation.    Heart:  Regular rate and rhythm. Abdomen:  Soft, nontender and nondistended. Normal bowel sounds, without guarding, and without rebound.   Neurologic:  Alert and  oriented x4;  grossly normal neurologically.  Impression/Plan: Sarah Marquez is here for an endoscopy and colonoscopy to be performed for epigastric pain and PH colon polyps.  Risks, benefits, limitations, and alternatives regarding  endoscopy and colonoscopy have been reviewed with the patient.  Questions have been answered.  All parties agreeable.   Gaylyn Cheers, MD  10/16/2017, 9:23 AM

## 2017-10-16 NOTE — Anesthesia Post-op Follow-up Note (Signed)
Anesthesia QCDR form completed.        

## 2017-10-16 NOTE — Op Note (Signed)
[facility] Patient Name: Sarah Marquez Procedure Date: 10/16/2017 8:57 AM MRN: [id number] Account #: [id number] Date of Birth: 1956/01/11 Admit Type: Outpatient Age: 62 Room: [facility] ENDO ROOM 3 Gender: Female Note Status: Finalized Procedure:            Colonoscopy Indications:          High risk colon cancer surveillance: Personal history                        of colonic polyps Providers:            Manya Silvas, MD Medicines:            Propofol per Anesthesia Complications:        No immediate complications. Procedure:            Pre-Anesthesia Assessment:                       - After reviewing the risks and benefits, the patient                        was deemed in satisfactory condition to undergo the                        procedure.                       After obtaining informed consent, the colonoscope was                        passed under direct vision. Throughout the procedure,                        the patient's blood pressure, pulse, and oxygen                        saturations were monitored continuously. The was                        introduced through the anus and advanced to the the                        cecum, identified by appendiceal orifice and ileocecal                        valve. The Colonoscope was introduced through the anus                        and advanced to the the cecum, identified by                        appendiceal orifice and ileocecal valve. The                        colonoscopy was performed without difficulty. The                        patient tolerated the procedure well. The quality of                        the bowel preparation was excellent. Findings:  Internal hemorrhoids were found during endoscopy. The hemorrhoids were       small and Grade I (internal hemorrhoids that do not prolapse).      The exam was otherwise without abnormality. Impression:           - Internal  hemorrhoids.                       - The examination was otherwise normal.                       - No specimens collected. Recommendation:       - Repeat colonoscopy in 5 years for surveillance. Manya Silvas, MD 10/16/2017 9:55:59 AM This report has been signed electronically. Number of Addenda: 0 Note Initiated On: 10/16/2017 8:57 AM Scope Withdrawal Time: 0 hours 6 minutes 17 seconds  Total Procedure Duration: 0 hours 9 minutes 18 seconds       [facility]

## 2017-10-17 ENCOUNTER — Encounter: Payer: Self-pay | Admitting: Unknown Physician Specialty

## 2017-10-18 LAB — SURGICAL PATHOLOGY

## 2017-10-21 ENCOUNTER — Ambulatory Visit: Admit: 2017-10-21 | Payer: BLUE CROSS/BLUE SHIELD | Admitting: Unknown Physician Specialty

## 2017-10-21 SURGERY — ESOPHAGOGASTRODUODENOSCOPY (EGD) WITH PROPOFOL
Anesthesia: General

## 2017-10-23 DIAGNOSIS — H2513 Age-related nuclear cataract, bilateral: Secondary | ICD-10-CM | POA: Diagnosis not present

## 2017-11-22 DIAGNOSIS — B9689 Other specified bacterial agents as the cause of diseases classified elsewhere: Secondary | ICD-10-CM | POA: Diagnosis not present

## 2017-11-22 DIAGNOSIS — J019 Acute sinusitis, unspecified: Secondary | ICD-10-CM | POA: Diagnosis not present

## 2017-11-23 ENCOUNTER — Other Ambulatory Visit: Payer: Self-pay | Admitting: Physician Assistant

## 2018-01-14 ENCOUNTER — Ambulatory Visit: Payer: BLUE CROSS/BLUE SHIELD | Admitting: Family Medicine

## 2018-01-14 VITALS — BP 130/70 | HR 70 | Temp 98.0°F | Resp 16 | Wt 281.0 lb

## 2018-01-14 DIAGNOSIS — K2100 Gastro-esophageal reflux disease with esophagitis, without bleeding: Secondary | ICD-10-CM

## 2018-01-14 DIAGNOSIS — E7849 Other hyperlipidemia: Secondary | ICD-10-CM | POA: Diagnosis not present

## 2018-01-14 DIAGNOSIS — D508 Other iron deficiency anemias: Secondary | ICD-10-CM

## 2018-01-14 DIAGNOSIS — I10 Essential (primary) hypertension: Secondary | ICD-10-CM | POA: Diagnosis not present

## 2018-01-14 DIAGNOSIS — Z23 Encounter for immunization: Secondary | ICD-10-CM | POA: Diagnosis not present

## 2018-01-14 DIAGNOSIS — Z6841 Body Mass Index (BMI) 40.0 and over, adult: Secondary | ICD-10-CM

## 2018-01-14 NOTE — Progress Notes (Signed)
Sarah Marquez  MRN: [id number] DOB: 24-Oct-1955  Subjective:  HPI   The patient is a 62 year old female who presents for follow up of her chronic health.  She was last seen for this on 07/03/17.  Since that time she has had her GI referral with endoscopy and colonoscopy on 10/16/17.  Hypertension-Blood pressure has been stable per last office note. BP Readings from Last 3 Encounters:  01/14/18 130/70  10/16/17 (!) 156/70  07/03/17 120/64   Iron deficiency anemia-Patient had been on iron on and off prior to her last visit.  Her labs on that day her level was normal.  She was instructed to discontinue her iron at that time.    Esophagitis-Patient continues on Dexilant.  Per the patient her procedures were normal other than gastritis.  She continues with her Dexilant.   Patient Active Problem List   Diagnosis Date Noted  . Cough variant asthma 12/27/2015  . Dyspnea 11/14/2015  . OSA (obstructive sleep apnea) 11/10/2015  . Recurrent URI (upper respiratory infection) 11/10/2015  . Cough 11/10/2015  . Benign neoplasm of colon 01/19/2015  . Allergic rhinitis 01/19/2015  . Absolute anemia 01/19/2015  . Colon polyp 01/19/2015  . Ankle bruise 01/19/2015  . Dysmetabolic syndrome X 94/49/6759  . Disorder of esophagus 01/19/2015  . Edema 01/19/2015  . Esophagitis, reflux 01/19/2015  . Frontal sinusitis 01/19/2015  . BP (high blood pressure) 01/19/2015  . Anemia, iron deficiency 01/19/2015  . Iron deficiency anemia 01/19/2015  . Abnormal kidney function 01/19/2015  . Cervical pain 01/19/2015  . Dyspnea, paroxysmal nocturnal 01/19/2015  . Gastric ulcer 01/19/2015  . Hive 01/19/2015  . B12 deficiency 01/19/2015  . Vitamin D deficiency 01/19/2015  . Degenerative arthritis of finger 06/07/2014  . Benign paroxysmal positional nystagmus 03/09/2009  . HLD (hyperlipidemia) 11/03/2008  . Fam hx-ischem heart disease 06/26/2008  . History of tobacco use 06/26/2008  . Essential (primary)  hypertension 06/03/2008  . Extreme obesity 06/03/2008  . Apnea, sleep 06/03/2008  . Morbid obesity ([place]) 06/03/2008  . H/O total hysterectomy 04/30/1998    Past Medical History:  Diagnosis Date  . Carpal tunnel syndrome   . Colon polyps   . Difficult intubation   . Gastric ulcer   . GERD (gastroesophageal reflux disease)   . Hyperlipidemia   . Hypertension   . Iron deficiency anemia     Social History   Socioeconomic History  . Marital status: Married    Spouse name: Not on file  . Number of children: Not on file  . Years of education: Not on file  . Highest education level: Not on file  Occupational History  . Not on file  Social Needs  . Financial resource strain: Not on file  . Food insecurity:    Worry: Not on file    Inability: Not on file  . Transportation needs:    Medical: Not on file    Non-medical: Not on file  Tobacco Use  . Smoking status: Never Smoker  . Smokeless tobacco: Never Used  . Tobacco comment: former social smoker  Substance and Sexual Activity  . Alcohol use: No    Alcohol/week: 0.0 standard drinks  . Drug use: No  . Sexual activity: Not on file  Lifestyle  . Physical activity:    Days per week: Not on file    Minutes per session: Not on file  . Stress: Not on file  Relationships  . Social connections:    Talks on  phone: Not on file    Gets together: Not on file    Attends religious service: Not on file    Active member of club or organization: Not on file    Attends meetings of clubs or organizations: Not on file    Relationship status: Not on file  . Intimate partner violence:    Fear of current or ex partner: Not on file    Emotionally abused: Not on file    Physically abused: Not on file    Forced sexual activity: Not on file  Other Topics Concern  . Not on file  Social History Narrative  . Not on file    Outpatient Encounter Medications as of 01/14/2018  Medication Sig  . albuterol (PROAIR HFA) 108 (90 Base) MCG/ACT  inhaler Inhale into the lungs.  . beclomethasone (QVAR) 80 MCG/ACT inhaler Inhale 2 puffs into the lungs 2 (two) times daily.  . Cholecalciferol (VITAMIN D-1000 MAX ST) 1000 UNITS tablet Take by mouth.  . DEXILANT 60 MG capsule Take 1 capsule by mouth daily.  Marland Kitchen losartan (COZAAR) 50 MG tablet TAKE 1 TABLET BY MOUTH EVERY DAY  . Magnesium 200 MG TABS Take by mouth.  . montelukast (SINGULAIR) 10 MG tablet TAKE 1 TABLET (10 MG TOTAL) BY MOUTH DAILY.  . NON FORMULARY CPAP at night  . verapamil (CALAN-SR) 240 MG CR tablet TAKE 1 TABLET BY MOUTH EVERY DAY  . [DISCONTINUED] brompheniramine-pseudoephedrine-DM 30-2-10 MG/5ML syrup Take 5 mLs by mouth 4 (four) times daily as needed.  . [DISCONTINUED] losartan (COZAAR) 50 MG tablet TAKE 1 TABLET BY MOUTH EVERY DAY   No facility-administered encounter medications on file as of 01/14/2018.     Allergies  Allergen Reactions  . Beclomethasone   . Sulfa Antibiotics   . Latex Rash  . Meloxicam Palpitations    Review of Systems  Constitutional: Negative for fever and malaise/fatigue.  Eyes: Negative.   Respiratory: Negative for cough, shortness of breath and wheezing.   Cardiovascular: Negative for chest pain, palpitations, orthopnea, claudication and leg swelling.  Skin: Negative.   Endo/Heme/Allergies: Negative.   Psychiatric/Behavioral: Negative.     Objective:  BP 130/70 (BP Location: Right Arm, Patient Position: Sitting, Cuff Size: Large)   Pulse 70   Temp 98 F (36.7 C) (Oral)   Resp 16   Wt 281 lb (127.5 kg)   SpO2 95%   BMI 46.76 kg/m   Physical Exam  Constitutional: She is oriented to person, place, and time and well-developed, well-nourished, and in no distress.  Obese WF NAD.  HENT:  Head: Normocephalic and atraumatic.  Eyes: Conjunctivae are normal. No scleral icterus.  Neck: No thyromegaly present.  Cardiovascular: Normal rate, regular rhythm, normal heart sounds and intact distal pulses.  Pulmonary/Chest: Effort normal  and breath sounds normal.  Musculoskeletal: She exhibits no edema.  Neurological: She is alert and oriented to person, place, and time. Gait normal. GCS score is 15.  Skin: Skin is warm and dry.  Psychiatric: Mood, memory, affect and judgment normal.    Assessment and Plan :  1. Essential (primary) hypertension  - CBC with Differential/Platelet - Comprehensive metabolic panel - TSH  2. Esophagitis, reflux   3. Other iron deficiency anemia  - CBC with Differential/Platelet - Iron  4. Need for influenza vaccination  - Flu Vaccine QUAD 6+ mos PF IM (Fluarix Quad PF)  5. Other hyperlipidemia  - Lipid Panel With LDL/HDL Ratio 6.Obesity  I have done the exam and reviewed the chart  and it is accurate to the best of my knowledge. Development worker, community has been used and  any errors in dictation or transcription are unintentional. Miguel Aschoff M.D. [facility]

## 2018-01-15 ENCOUNTER — Encounter: Payer: Self-pay | Admitting: Obstetrics and Gynecology

## 2018-01-15 ENCOUNTER — Other Ambulatory Visit (HOSPITAL_COMMUNITY)
Admission: RE | Admit: 2018-01-15 | Discharge: 2018-01-15 | Disposition: A | Discharge status: Home / Self Care | Payer: BLUE CROSS/BLUE SHIELD | Source: Ambulatory Visit | Attending: Obstetrics and Gynecology | Admitting: Obstetrics and Gynecology

## 2018-01-15 ENCOUNTER — Ambulatory Visit (INDEPENDENT_AMBULATORY_CARE_PROVIDER_SITE_OTHER): Payer: BLUE CROSS/BLUE SHIELD | Admitting: Obstetrics and Gynecology

## 2018-01-15 VITALS — BP 138/70 | HR 85 | Ht 65.0 in | Wt 280.0 lb

## 2018-01-15 DIAGNOSIS — Z1151 Encounter for screening for human papillomavirus (HPV): Secondary | ICD-10-CM

## 2018-01-15 DIAGNOSIS — Z1231 Encounter for screening mammogram for malignant neoplasm of breast: Secondary | ICD-10-CM | POA: Diagnosis not present

## 2018-01-15 DIAGNOSIS — Z01419 Encounter for gynecological examination (general) (routine) without abnormal findings: Secondary | ICD-10-CM | POA: Diagnosis not present

## 2018-01-15 DIAGNOSIS — Z124 Encounter for screening for malignant neoplasm of cervix: Secondary | ICD-10-CM | POA: Insufficient documentation

## 2018-01-15 DIAGNOSIS — Z1239 Encounter for other screening for malignant neoplasm of breast: Secondary | ICD-10-CM

## 2018-01-15 LAB — CBC WITH DIFFERENTIAL/PLATELET
BASOS: 1 %
Basophils Absolute: 0.1 10*3/uL (ref 0.0–0.2)
EOS (ABSOLUTE): 0.7 10*3/uL — AB (ref 0.0–0.4)
Eos: 7 %
HEMOGLOBIN: 13.7 g/dL (ref 11.1–15.9)
Hematocrit: 42.1 % (ref 34.0–46.6)
Immature Grans (Abs): 0 10*3/uL (ref 0.0–0.1)
Immature Granulocytes: 0 %
Lymphocytes Absolute: 2.4 10*3/uL (ref 0.7–3.1)
Lymphs: 26 %
MCH: 28.1 pg (ref 26.6–33.0)
MCHC: 32.5 g/dL (ref 31.5–35.7)
MCV: 86 fL (ref 79–97)
MONOCYTES: 7 %
Monocytes Absolute: 0.6 10*3/uL (ref 0.1–0.9)
NEUTROS ABS: 5.4 10*3/uL (ref 1.4–7.0)
Neutrophils: 59 %
Platelets: 250 10*3/uL (ref 150–450)
RBC: 4.87 x10E6/uL (ref 3.77–5.28)
RDW: 13.5 % (ref 12.3–15.4)
WBC: 9.1 10*3/uL (ref 3.4–10.8)

## 2018-01-15 LAB — COMPREHENSIVE METABOLIC PANEL
A/G RATIO: 1.1 — AB (ref 1.2–2.2)
ALBUMIN: 3.8 g/dL (ref 3.6–4.8)
ALT: 18 IU/L (ref 0–32)
AST: 33 IU/L (ref 0–40)
Alkaline Phosphatase: 132 IU/L — ABNORMAL HIGH (ref 39–117)
BILIRUBIN TOTAL: 0.6 mg/dL (ref 0.0–1.2)
BUN / CREAT RATIO: 23 (ref 12–28)
BUN: 23 mg/dL (ref 8–27)
CHLORIDE: 102 mmol/L (ref 96–106)
CO2: 21 mmol/L (ref 20–29)
Calcium: 9.5 mg/dL (ref 8.7–10.3)
Creatinine, Ser: 0.99 mg/dL (ref 0.57–1.00)
GFR calc non Af Amer: 61 mL/min/{1.73_m2} (ref >59)
GFR, EST AFRICAN AMERICAN: 70 mL/min/{1.73_m2} (ref >59)
GLOBULIN, TOTAL: 3.4 g/dL (ref 1.5–4.5)
Glucose: 93 mg/dL (ref 65–99)
POTASSIUM: 4.7 mmol/L (ref 3.5–5.2)
Sodium: 141 mmol/L (ref 134–144)
Total Protein: 7.2 g/dL (ref 6.0–8.5)

## 2018-01-15 LAB — LIPID PANEL WITH LDL/HDL RATIO
Cholesterol, Total: 217 mg/dL — ABNORMAL HIGH (ref 100–199)
HDL: 37 mg/dL — AB (ref >39)
LDL Calculated: 148 mg/dL — ABNORMAL HIGH (ref 0–99)
LDl/HDL Ratio: 4 ratio — ABNORMAL HIGH (ref 0.0–3.2)
Triglycerides: 160 mg/dL — ABNORMAL HIGH (ref 0–149)
VLDL Cholesterol Cal: 32 mg/dL (ref 5–40)

## 2018-01-15 LAB — IRON: Iron: 90 ug/dL (ref 27–139)

## 2018-01-15 LAB — TSH: TSH: 3.84 u[IU]/mL (ref 0.450–4.500)

## 2018-01-15 NOTE — Progress Notes (Signed)
PCP: Jerrol Banana., MD   Chief Complaint  Patient presents with  . Gynecologic Exam    HPI:      Ms. Sarah Marquez is a 62 y.o. No obstetric history on file. who LMP was No LMP recorded. Patient is postmenopausal., presents today for her annual examination.  Her menses are absent due to TAH due to leio/menorrhagia. She does not have intermenstrual bleeding. She does not have vasomotor sx.   Sex activity: single partner, contraception - post menopausal status. She does not have vaginal dryness.  Last Pap: August 23, 2015  Results were: no abnormalities Hx of STDs: none  Last mammogram: September 30, 2017  Results were: normal--routine follow-up in 12 months There is a FH of breast cancer in her mom, genetic testing not indicated. Pt had genetic testing done a few yrs ago for anemia at [place] and thinks cancer testing done at that time, too. No notes available. There is no FH of ovarian cancer. The patient does do self-breast exams.  Colonoscopy: 2019, no abnormalities.  Repeat due after 5 or 10 years--pt unsure.   Tobacco use: The patient denies current or previous tobacco use. Alcohol use: none Exercise: not active  She does get adequate calcium and Vitamin D in her diet.  Labs with PCP.   Past Medical History:  Diagnosis Date  . Carpal tunnel syndrome   . Colon polyps   . Difficult intubation   . Gastric ulcer   . GERD (gastroesophageal reflux disease)   . Hyperlipidemia   . Hypertension   . Iron deficiency anemia     Past Surgical History:  Procedure Laterality Date  . ABDOMINAL HYSTERECTOMY    . APPENDECTOMY    . CARDIAC CATHETERIZATION    . CHOLECYSTECTOMY    . COLONOSCOPY    . COLONOSCOPY WITH PROPOFOL N/A 10/16/2017   Procedure: COLONOSCOPY WITH PROPOFOL;  Surgeon: Manya Silvas, MD;  Location[facility] ENDOSCOPY;  Service: Endoscopy;  Laterality: N/A;  . ESOPHAGOGASTRODUODENOSCOPY    . ESOPHAGOGASTRODUODENOSCOPY (EGD) WITH PROPOFOL N/A  10/16/2017   Procedure: ESOPHAGOGASTRODUODENOSCOPY (EGD) WITH PROPOFOL;  Surgeon: Manya Silvas, MD;  Location: [facility] ENDOSCOPY;  Service: Endoscopy;  Laterality: N/A;  . FUNCTIONAL ENDOSCOPIC SINUS SURGERY    . knee arthoscopy    . LUMBAR DISC SURGERY     ruptured disc x 2  . TONSILLECTOMY AND ADENOIDECTOMY      Family History  Problem Relation Age of Onset  . Breast cancer Mother 51  . Heart attack Father   . Heart attack Brother     Social History   Socioeconomic History  . Marital status: Married    Spouse name: Not on file  . Number of children: Not on file  . Years of education: Not on file  . Highest education level: Not on file  Occupational History  . Not on file  Social Needs  . Financial resource strain: Not on file  . Food insecurity:    Worry: Not on file    Inability: Not on file  . Transportation needs:    Medical: Not on file    Non-medical: Not on file  Tobacco Use  . Smoking status: Never Smoker  . Smokeless tobacco: Never Used  . Tobacco comment: former social smoker  Substance and Sexual Activity  . Alcohol use: No    Alcohol/week: 0.0 standard drinks  . Drug use: No  . Sexual activity: Yes    Birth control/protection: Post-menopausal  Lifestyle  . Physical activity:    Days per week: Not on file    Minutes per session: Not on file  . Stress: Not on file  Relationships  . Social connections:    Talks on phone: Not on file    Gets together: Not on file    Attends religious service: Not on file    Active member of club or organization: Not on file    Attends meetings of clubs or organizations: Not on file    Relationship status: Not on file  . Intimate partner violence:    Fear of current or ex partner: Not on file    Emotionally abused: Not on file    Physically abused: Not on file    Forced sexual activity: Not on file  Other Topics Concern  . Not on file  Social History Narrative  . Not on file    Outpatient Medications Prior  to Visit  Medication Sig Dispense Refill  . albuterol (PROAIR HFA) 108 (90 Base) MCG/ACT inhaler Inhale into the lungs.    . beclomethasone (QVAR) 80 MCG/ACT inhaler Inhale 2 puffs into the lungs 2 (two) times daily.    . Cholecalciferol (VITAMIN D-1000 MAX ST) 1000 UNITS tablet Take by mouth.    . DEXILANT 60 MG capsule Take 1 capsule by mouth daily.  11  . losartan (COZAAR) 50 MG tablet TAKE 1 TABLET BY MOUTH EVERY DAY 90 tablet 3  . Magnesium 200 MG TABS Take by mouth.    . montelukast (SINGULAIR) 10 MG tablet TAKE 1 TABLET (10 MG TOTAL) BY MOUTH DAILY. 30 tablet 2  . NON FORMULARY CPAP at night    . verapamil (CALAN-SR) 240 MG CR tablet TAKE 1 TABLET BY MOUTH EVERY DAY 90 tablet 3   No facility-administered medications prior to visit.      ROS:  Review of Systems  Constitutional: Negative for fatigue, fever and unexpected weight change.  Respiratory: Negative for cough, shortness of breath and wheezing.   Cardiovascular: Negative for chest pain, palpitations and leg swelling.  Gastrointestinal: Negative for blood in stool, constipation, diarrhea, nausea and vomiting.  Endocrine: Negative for cold intolerance, heat intolerance and polyuria.  Genitourinary: Negative for dyspareunia, dysuria, flank pain, frequency, genital sores, hematuria, menstrual problem, pelvic pain, urgency, vaginal bleeding, vaginal discharge and vaginal pain.  Musculoskeletal: Positive for arthralgias. Negative for back pain, joint swelling and myalgias.  Skin: Negative for rash.  Neurological: Negative for dizziness, syncope, light-headedness, numbness and headaches.  Hematological: Negative for adenopathy.  Psychiatric/Behavioral: Negative for agitation, confusion, sleep disturbance and suicidal ideas. The patient is not nervous/anxious.    BREAST: No symptoms    Objective: BP 138/70   Pulse 85   Ht 5\' 5"  (1.651 m)   Wt 280 lb (127 kg)   BMI 46.59 kg/m    Physical Exam  Constitutional: She is  oriented to person, place, and time. She appears well-developed and well-nourished.  Genitourinary: Vagina normal. There is no rash or tenderness on the right labia. There is no rash or tenderness on the left labia. No erythema or tenderness in the vagina. No vaginal discharge found. Right adnexum does not display mass and does not display tenderness. Left adnexum does not display mass and does not display tenderness.  Genitourinary Comments: UTERUS/CX SURG REM  Neck: Normal range of motion. No thyromegaly present.  Cardiovascular: Normal rate, regular rhythm and normal heart sounds.  No murmur heard. Pulmonary/Chest: Effort normal and breath sounds normal. Right breast  exhibits no mass, no nipple discharge, no skin change and no tenderness. Left breast exhibits no mass, no nipple discharge, no skin change and no tenderness.  Abdominal: Soft. There is no tenderness. There is no guarding.  Musculoskeletal: Normal range of motion.  Neurological: She is alert and oriented to person, place, and time. No cranial nerve deficit.  Psychiatric: She has a normal mood and affect. Her behavior is normal.  Vitals reviewed.   Assessment/Plan:  Encounter for annual routine gynecological examination  Cervical cancer screening - Plan: Cytology - PAP  Screening for HPV (human papillomavirus) - Plan: Cytology - PAP  Screening for breast cancer - Pt current on mammo         GYN counsel breast self exam, mammography screening, menopause, adequate intake of calcium and vitamin D, diet and exercise    F/U  Return in about 1 year (around 01/16/2019).  Kathren Scearce B. Savon Cobbs, PA-C 01/15/2018 10:27 AM

## 2018-01-15 NOTE — Patient Instructions (Signed)
I value your feedback and entrusting us with your care. If you get a [place] patient survey, I would appreciate you taking the time to let us know about your experience today. Thank you! 

## 2018-01-16 LAB — CYTOLOGY - PAP
DIAGNOSIS: NEGATIVE
HPV: NOT DETECTED

## 2018-02-04 ENCOUNTER — Ambulatory Visit
Admission: RE | Admit: 2018-02-04 | Discharge: 2018-02-04 | Disposition: A | Discharge status: Home / Self Care | Payer: BLUE CROSS/BLUE SHIELD | Source: Ambulatory Visit | Attending: Family Medicine | Admitting: Family Medicine

## 2018-02-04 ENCOUNTER — Ambulatory Visit: Payer: BLUE CROSS/BLUE SHIELD | Admitting: Family Medicine

## 2018-02-04 ENCOUNTER — Encounter: Payer: Self-pay | Admitting: Family Medicine

## 2018-02-04 VITALS — BP 128/68 | HR 54 | Temp 97.9°F | Resp 16 | Wt 282.0 lb

## 2018-02-04 DIAGNOSIS — N3090 Cystitis, unspecified without hematuria: Secondary | ICD-10-CM | POA: Diagnosis not present

## 2018-02-04 DIAGNOSIS — R32 Unspecified urinary incontinence: Secondary | ICD-10-CM | POA: Diagnosis not present

## 2018-02-04 DIAGNOSIS — N201 Calculus of ureter: Secondary | ICD-10-CM | POA: Diagnosis not present

## 2018-02-04 NOTE — Progress Notes (Signed)
Patient: Sarah Marquez Female    DOB: 1955/06/26   62 y.o.   MRN: [id number] Visit Date: 02/04/2018  Today's Provider: Wilhemena Durie, MD   Chief Complaint  Patient presents with  . Urinary Incontinence   Subjective:    HPI Patient comes in today c/o urinary incontinence. She reports that she has had symptoms X 4 days. Patient also mentions that she has had low back pain that radiates to her right side. She denies dysuria to hematuria. She has only taken Aleve for her symptoms.      Allergies  Allergen Reactions  . Beclomethasone   . Sulfa Antibiotics   . Latex Rash  . Meloxicam Palpitations     Current Outpatient Medications:  .  albuterol (PROAIR HFA) 108 (90 Base) MCG/ACT inhaler, Inhale into the lungs., Disp: , Rfl:  .  beclomethasone (QVAR) 80 MCG/ACT inhaler, Inhale 2 puffs into the lungs 2 (two) times daily., Disp: , Rfl:  .  Cholecalciferol (VITAMIN D-1000 MAX ST) 1000 UNITS tablet, Take by mouth., Disp: , Rfl:  .  DEXILANT 60 MG capsule, Take 1 capsule by mouth daily., Disp: , Rfl: 11 .  losartan (COZAAR) 50 MG tablet, TAKE 1 TABLET BY MOUTH EVERY DAY, Disp: 90 tablet, Rfl: 3 .  Magnesium 200 MG TABS, Take by mouth., Disp: , Rfl:  .  montelukast (SINGULAIR) 10 MG tablet, TAKE 1 TABLET (10 MG TOTAL) BY MOUTH DAILY., Disp: 30 tablet, Rfl: 2 .  NON FORMULARY, CPAP at night, Disp: , Rfl:  .  verapamil (CALAN-SR) 240 MG CR tablet, TAKE 1 TABLET BY MOUTH EVERY DAY, Disp: 90 tablet, Rfl: 3  Review of Systems  Constitutional: Negative for activity change, chills, fatigue and fever.  Eyes: Negative.   Respiratory: Negative.   Cardiovascular: Negative.   Gastrointestinal: Negative.   Genitourinary: Positive for dysuria, flank pain and urgency. Negative for hematuria.  Allergic/Immunologic: Negative.   Psychiatric/Behavioral: Negative.     Social History   Tobacco Use  . Smoking status: Never Smoker  . Smokeless tobacco: Never Used  . Tobacco comment:  former social smoker  Substance Use Topics  . Alcohol use: No    Alcohol/week: 0.0 standard drinks   Objective:   BP 128/68 (BP Location: Left Arm, Patient Position: Sitting, Cuff Size: Large)   Pulse (!) 54   Temp 97.9 F (36.6 C)   Resp 16   Wt 282 lb (127.9 kg)   SpO2 95%   BMI 46.93 kg/m  Vitals:   02/04/18 1446  BP: 128/68  Pulse: (!) 54  Resp: 16  Temp: 97.9 F (36.6 C)  SpO2: 95%  Weight: 282 lb (127.9 kg)      Physical Exam  Constitutional: She is oriented to person, place, and time. She appears well-developed and well-nourished.  HENT:  Head: Normocephalic and atraumatic.  Right Ear: External ear normal.  Left Ear: External ear normal.  Nose: Nose normal.  Eyes: Conjunctivae are normal. No scleral icterus.  Neck: No thyromegaly present.  Cardiovascular: Normal rate, regular rhythm and normal heart sounds.  Pulmonary/Chest: Effort normal and breath sounds normal.  Abdominal: Soft.  No CVAT.  Musculoskeletal: She exhibits no edema.  Neurological: She is alert and oriented to person, place, and time.  Skin: Skin is warm and dry.  Psychiatric: She has a normal mood and affect. Her behavior is normal. Judgment and thought content normal.        Assessment & Plan:  1. Urinary incontinence, unspecified type F/U pending reports. Refer to Urology as below.  - DG Abd 1 View; Future, AKA KUB - POCT urinalysis dipstick - Urine Culture - Ambulatory referral to Urology  2. Cystitis  - DG Abd 1 View; Future  3. Kidney stone Urology referral.       I have done the exam and reviewed the above chart and it is accurate to the best of my knowledge. Development worker, community has been used in this note in any air is in the dictation or transcription are unintentional.  Wilhemena Durie, MD  [place]

## 2018-02-04 NOTE — Patient Instructions (Signed)
Increase fluids and drink Cranberry juice.

## 2018-02-05 LAB — POCT URINALYSIS DIPSTICK
BILIRUBIN UA: NEGATIVE
Blood, UA: NEGATIVE
GLUCOSE UA: NEGATIVE
Ketones, UA: NEGATIVE
LEUKOCYTES UA: NEGATIVE
Nitrite, UA: NEGATIVE
Protein, UA: NEGATIVE
Spec Grav, UA: 1.02 (ref 1.010–1.025)
Urobilinogen, UA: 0.2 E.U./dL
pH, UA: 6 (ref 5.0–8.0)

## 2018-02-06 ENCOUNTER — Telehealth: Payer: Self-pay

## 2018-02-06 LAB — URINE CULTURE

## 2018-02-06 NOTE — Telephone Encounter (Signed)
Pt advised.   Thanks,   -[place]weed  

## 2018-02-06 NOTE — Telephone Encounter (Signed)
-----   Message from Jerrol Banana., MD sent at 02/06/2018  8:21 AM EDT ----- Hassel Neth [place]

## 2018-02-20 ENCOUNTER — Ambulatory Visit: Payer: BLUE CROSS/BLUE SHIELD | Admitting: Urology

## 2018-03-14 DIAGNOSIS — M19071 Primary osteoarthritis, right ankle and foot: Secondary | ICD-10-CM | POA: Diagnosis not present

## 2018-03-14 DIAGNOSIS — M79671 Pain in right foot: Secondary | ICD-10-CM | POA: Diagnosis not present

## 2018-04-08 DIAGNOSIS — H6122 Impacted cerumen, left ear: Secondary | ICD-10-CM | POA: Diagnosis not present

## 2018-04-08 DIAGNOSIS — R05 Cough: Secondary | ICD-10-CM | POA: Diagnosis not present

## 2018-04-08 DIAGNOSIS — J45991 Cough variant asthma: Secondary | ICD-10-CM | POA: Diagnosis not present

## 2018-04-15 DIAGNOSIS — L400 Psoriasis vulgaris: Secondary | ICD-10-CM | POA: Diagnosis not present

## 2018-04-15 DIAGNOSIS — L718 Other rosacea: Secondary | ICD-10-CM | POA: Diagnosis not present

## 2018-04-15 DIAGNOSIS — L570 Actinic keratosis: Secondary | ICD-10-CM | POA: Diagnosis not present

## 2018-04-15 DIAGNOSIS — C4441 Basal cell carcinoma of skin of scalp and neck: Secondary | ICD-10-CM | POA: Diagnosis not present

## 2018-04-15 DIAGNOSIS — X32XXXA Exposure to sunlight, initial encounter: Secondary | ICD-10-CM | POA: Diagnosis not present

## 2018-04-15 DIAGNOSIS — C44519 Basal cell carcinoma of skin of other part of trunk: Secondary | ICD-10-CM | POA: Diagnosis not present

## 2018-04-15 DIAGNOSIS — L821 Other seborrheic keratosis: Secondary | ICD-10-CM | POA: Diagnosis not present

## 2018-04-15 DIAGNOSIS — D485 Neoplasm of uncertain behavior of skin: Secondary | ICD-10-CM | POA: Diagnosis not present

## 2018-04-16 DIAGNOSIS — M65352 Trigger finger, left little finger: Secondary | ICD-10-CM | POA: Diagnosis not present

## 2018-05-07 DIAGNOSIS — C4441 Basal cell carcinoma of skin of scalp and neck: Secondary | ICD-10-CM | POA: Diagnosis not present

## 2018-05-07 DIAGNOSIS — C44519 Basal cell carcinoma of skin of other part of trunk: Secondary | ICD-10-CM | POA: Diagnosis not present

## 2018-06-15 ENCOUNTER — Other Ambulatory Visit: Payer: Self-pay | Admitting: Family Medicine

## 2018-06-16 NOTE — Telephone Encounter (Signed)
Pharmacy requesting refills. Thanks!  

## 2018-07-15 ENCOUNTER — Ambulatory Visit: Payer: Self-pay | Admitting: Family Medicine

## 2018-07-23 ENCOUNTER — Encounter: Payer: Self-pay | Admitting: Family Medicine

## 2018-07-23 ENCOUNTER — Ambulatory Visit: Payer: BLUE CROSS/BLUE SHIELD | Admitting: Family Medicine

## 2018-07-23 ENCOUNTER — Other Ambulatory Visit: Payer: Self-pay

## 2018-07-23 VITALS — BP 142/78 | HR 58 | Temp 97.9°F | Wt 270.0 lb

## 2018-07-23 DIAGNOSIS — J321 Chronic frontal sinusitis: Secondary | ICD-10-CM | POA: Diagnosis not present

## 2018-07-23 DIAGNOSIS — I10 Essential (primary) hypertension: Secondary | ICD-10-CM | POA: Diagnosis not present

## 2018-07-23 DIAGNOSIS — J069 Acute upper respiratory infection, unspecified: Secondary | ICD-10-CM

## 2018-07-23 DIAGNOSIS — D509 Iron deficiency anemia, unspecified: Secondary | ICD-10-CM

## 2018-07-23 DIAGNOSIS — G4733 Obstructive sleep apnea (adult) (pediatric): Secondary | ICD-10-CM

## 2018-07-23 DIAGNOSIS — R202 Paresthesia of skin: Secondary | ICD-10-CM

## 2018-07-23 MED ORDER — AMOXICILLIN-POT CLAVULANATE 875-125 MG PO TABS: 1.0000 | ORAL_TABLET | Freq: Two times a day (BID) | ORAL | 0 refills | Status: DC

## 2018-07-23 NOTE — Patient Instructions (Signed)
Tylenol and fluids

## 2018-07-23 NOTE — Progress Notes (Signed)
Patient: Sarah Marquez Female    DOB: June 10, 1955   63 y.o.   MRN: [id number] Visit Date: 07/23/2018  Today's Provider: Wilhemena Durie, MD   Chief Complaint  Patient presents with  . Headache   Subjective:     Headache   This is a new problem. The current episode started in the past 7 days (about 4 days). The problem occurs daily. The problem has been gradually worsening. The pain is located in the frontal region. The pain does not radiate. The quality of the pain is described as aching, dull and shooting. The pain is moderate. Associated symptoms include sinus pressure and a visual change. Pertinent negatives include no coughing, ear pain, fever, loss of balance, sore throat, tinnitus, vomiting or weakness. She has tried acetaminophen for the symptoms. The treatment provided mild relief.  She wonders if it is time for follow-up lab work.  She last had labs 6 months ago.  She is fasting. She also says that for several months she has noticed occasionally tingling tingling in her left anterior biceps region.  No skin associated symptoms.  It is not associated with any activity.  She states she has had some chronic left shoulder problems.  Allergies  Allergen Reactions  . Beclomethasone   . Sulfa Antibiotics   . Latex Rash  . Meloxicam Palpitations     Current Outpatient Medications:  .  albuterol (PROAIR HFA) 108 (90 Base) MCG/ACT inhaler, Inhale into the lungs., Disp: , Rfl:  .  beclomethasone (QVAR) 80 MCG/ACT inhaler, Inhale 2 puffs into the lungs 2 (two) times daily., Disp: , Rfl:  .  Cholecalciferol (VITAMIN D-1000 MAX ST) 1000 UNITS tablet, Take by mouth., Disp: , Rfl:  .  DEXILANT 60 MG capsule, Take 1 capsule by mouth daily., Disp: , Rfl: 11 .  losartan (COZAAR) 50 MG tablet, TAKE 1 TABLET BY MOUTH EVERY DAY, Disp: 90 tablet, Rfl: 3 .  Magnesium 200 MG TABS, Take by mouth., Disp: , Rfl:  .  montelukast (SINGULAIR) 10 MG tablet, TAKE 1 TABLET (10 MG TOTAL) BY MOUTH  DAILY., Disp: 30 tablet, Rfl: 2 .  NON FORMULARY, CPAP at night, Disp: , Rfl:  .  verapamil (CALAN-SR) 240 MG CR tablet, TAKE 1 TABLET BY MOUTH EVERY DAY, Disp: 90 tablet, Rfl: 3  Review of Systems  Constitutional: Negative for fever.  HENT: Positive for sinus pressure. Negative for ear pain, sore throat and tinnitus.   Eyes: Negative.   Respiratory: Negative for cough.   Cardiovascular: Negative.   Gastrointestinal: Negative for vomiting.  Endocrine: Negative.   Musculoskeletal: Negative.   Allergic/Immunologic: Negative.   Neurological: Positive for headaches. Negative for weakness and loss of balance.  Hematological: Negative.   Psychiatric/Behavioral: Negative.     Social History   Tobacco Use  . Smoking status: Never Smoker  . Smokeless tobacco: Never Used  . Tobacco comment: former social smoker  Substance Use Topics  . Alcohol use: No    Alcohol/week: 0.0 standard drinks      Objective:   BP (!) 142/78 (BP Location: Right Arm, Patient Position: Sitting, Cuff Size: Large)   Pulse (!) 58   Temp 97.9 F (36.6 C)   Wt 270 lb (122.5 kg)   SpO2 98%   BMI 44.93 kg/m  Vitals:   07/23/18 1142  BP: (!) 142/78  Pulse: (!) 58  Temp: 97.9 F (36.6 C)  SpO2: 98%  Weight: 270 lb (122.5 kg)  Physical Exam Vitals signs reviewed.  Constitutional:      Appearance: She is well-developed. She is obese.  HENT:     Head: Normocephalic and atraumatic.     Comments: Mild tenderness over the left frontal sinus    Mouth/Throat:     Mouth: Mucous membranes are moist.     Pharynx: Oropharynx is clear.  Eyes:     General: No scleral icterus.    Extraocular Movements: Extraocular movements intact.     Right eye: Normal extraocular motion.     Conjunctiva/sclera: Conjunctivae normal.     Pupils: Pupils are equal, round, and reactive to light.     Comments: Visual fields intact.  Neck:     Thyroid: No thyromegaly.  Cardiovascular:     Rate and Rhythm: Normal rate and  regular rhythm.     Heart sounds: Normal heart sounds.  Pulmonary:     Effort: Pulmonary effort is normal.     Breath sounds: Normal breath sounds.  Abdominal:     Palpations: Abdomen is soft.  Lymphadenopathy:     Cervical: No cervical adenopathy.  Skin:    General: Skin is warm and dry.  Neurological:     Mental Status: She is alert and oriented to person, place, and time.     Cranial Nerves: No cranial nerve deficit.     Sensory: No sensory deficit.     Motor: No weakness.  Psychiatric:        Mood and Affect: Mood normal.        Behavior: Behavior normal.        Thought Content: Thought content normal.        Judgment: Judgment normal.         Assessment & Plan     1. Frontal sinusitis, unspecified chronicity She is to let us know if the symptoms do not resolve. - amoxicillin-clavulanate (AUGMENTIN) 875-125 MG tablet; Take 1 tablet by mouth 2 (two) times daily.  Dispense: 20 tablet; Refill: 0  2. Essential (primary) hypertension   3. OSA (obstructive sleep apnea)   4. Recurrent URI (upper respiratory infection)   5. Morbid obesity ([place])   6. Iron deficiency anemia, unspecified iron deficiency anemia type Pete CBC iron and renal panel. 7.  Tingling of left arm This could be a cervical problem.  It is intermittent with no associated symptoms.  Reassured the patient at this time and will follow clinically.   I have done the exam and reviewed the above chart and it is accurate to the best of my knowledge. Development worker, community has been used in this note in any air is in the dictation or transcription are unintentional.  Wilhemena Durie, MD  [facility]

## 2018-09-08 ENCOUNTER — Other Ambulatory Visit: Payer: Self-pay | Admitting: Family Medicine

## 2018-09-08 DIAGNOSIS — Z1231 Encounter for screening mammogram for malignant neoplasm of breast: Secondary | ICD-10-CM

## 2018-09-26 DIAGNOSIS — D485 Neoplasm of uncertain behavior of skin: Secondary | ICD-10-CM | POA: Diagnosis not present

## 2018-09-26 DIAGNOSIS — Z08 Encounter for follow-up examination after completed treatment for malignant neoplasm: Secondary | ICD-10-CM | POA: Diagnosis not present

## 2018-09-26 DIAGNOSIS — C44619 Basal cell carcinoma of skin of left upper limb, including shoulder: Secondary | ICD-10-CM | POA: Diagnosis not present

## 2018-09-26 DIAGNOSIS — L570 Actinic keratosis: Secondary | ICD-10-CM | POA: Diagnosis not present

## 2018-09-26 DIAGNOSIS — X32XXXA Exposure to sunlight, initial encounter: Secondary | ICD-10-CM | POA: Diagnosis not present

## 2018-09-26 DIAGNOSIS — Z85828 Personal history of other malignant neoplasm of skin: Secondary | ICD-10-CM | POA: Diagnosis not present

## 2018-09-26 DIAGNOSIS — L718 Other rosacea: Secondary | ICD-10-CM | POA: Diagnosis not present

## 2018-10-02 DIAGNOSIS — C44719 Basal cell carcinoma of skin of left lower limb, including hip: Secondary | ICD-10-CM | POA: Diagnosis not present

## 2018-10-06 ENCOUNTER — Other Ambulatory Visit: Payer: Self-pay

## 2018-10-06 ENCOUNTER — Ambulatory Visit
Admission: RE | Admit: 2018-10-06 | Discharge: 2018-10-06 | Disposition: A | Discharge status: Home / Self Care | Payer: BC Managed Care – PPO | Source: Ambulatory Visit | Attending: Family Medicine | Admitting: Family Medicine

## 2018-10-06 DIAGNOSIS — Z1231 Encounter for screening mammogram for malignant neoplasm of breast: Secondary | ICD-10-CM | POA: Diagnosis not present

## 2018-10-06 HISTORY — DX: Malignant (primary) neoplasm, unspecified: C80.1

## 2018-10-23 DIAGNOSIS — M19072 Primary osteoarthritis, left ankle and foot: Secondary | ICD-10-CM | POA: Diagnosis not present

## 2018-10-23 DIAGNOSIS — M79672 Pain in left foot: Secondary | ICD-10-CM | POA: Diagnosis not present

## 2018-10-23 DIAGNOSIS — M898X9 Other specified disorders of bone, unspecified site: Secondary | ICD-10-CM | POA: Diagnosis not present

## 2018-10-28 DIAGNOSIS — H2513 Age-related nuclear cataract, bilateral: Secondary | ICD-10-CM | POA: Diagnosis not present

## 2018-12-03 ENCOUNTER — Other Ambulatory Visit: Payer: Self-pay | Admitting: Family Medicine

## 2018-12-23 ENCOUNTER — Encounter: Payer: Self-pay | Admitting: Family Medicine

## 2018-12-23 ENCOUNTER — Other Ambulatory Visit: Payer: Self-pay

## 2018-12-23 ENCOUNTER — Ambulatory Visit (INDEPENDENT_AMBULATORY_CARE_PROVIDER_SITE_OTHER): Payer: Self-pay | Admitting: Family Medicine

## 2018-12-23 VITALS — Temp 98.4°F | Resp 16 | Wt 264.0 lb

## 2018-12-23 DIAGNOSIS — I10 Essential (primary) hypertension: Secondary | ICD-10-CM | POA: Diagnosis not present

## 2018-12-23 DIAGNOSIS — R42 Dizziness and giddiness: Secondary | ICD-10-CM

## 2018-12-23 DIAGNOSIS — G4733 Obstructive sleep apnea (adult) (pediatric): Secondary | ICD-10-CM

## 2018-12-23 DIAGNOSIS — E860 Dehydration: Secondary | ICD-10-CM

## 2018-12-23 MED ORDER — MECLIZINE HCL 25 MG PO TABS: 25.0000 mg | ORAL_TABLET | Freq: Three times a day (TID) | ORAL | 1 refills | Status: DC | PRN

## 2018-12-23 NOTE — Progress Notes (Addendum)
Patient: Sarah Marquez Female    DOB: 1955-05-20   63 y.o.   MRN: [id number] Visit Date: 12/23/2018  Today's Provider: Wilhemena Durie, MD   Chief Complaint  Patient presents with  . Dizziness   Subjective:    HPI Patient comes in today c/o dizzy spells that came on all of a sudden since yesterday. She reports that this has been ongoing all day long.  No syncope,chest pain or any other neurologic symptoms. The symptoms occur with any sudden movement.She has to spend a lot of time outside in the heat with her job and admits to not drinking enough water. BP Readings from Last 3 Encounters:  07/23/18 (!) 142/78  02/04/18 128/68  01/15/18 138/70   Wt Readings from Last 3 Encounters:  12/23/18 264 lb (119.7 kg)  07/23/18 270 lb (122.5 kg)  02/04/18 282 lb (127.9 kg)   Orthostatic VS for the past 24 hrs:  BP- Lying Pulse- Lying BP- Sitting Pulse- Sitting BP- Standing at 0 minutes Pulse- Standing at 0 minutes  12/23/18 1523 122/60 52 132/74 52 118/60 64     Allergies  Allergen Reactions  . Beclomethasone   . Sulfa Antibiotics   . Latex Rash  . Meloxicam Palpitations     Current Outpatient Medications:  .  albuterol (PROAIR HFA) 108 (90 Base) MCG/ACT inhaler, Inhale into the lungs., Disp: , Rfl:  .  beclomethasone (QVAR) 80 MCG/ACT inhaler, Inhale 2 puffs into the lungs 2 (two) times daily., Disp: , Rfl:  .  Cholecalciferol (VITAMIN D-1000 MAX ST) 1000 UNITS tablet, Take by mouth., Disp: , Rfl:  .  DEXILANT 60 MG capsule, Take 1 capsule by mouth daily., Disp: , Rfl: 11 .  losartan (COZAAR) 50 MG tablet, TAKE 1 TABLET BY MOUTH EVERY DAY, Disp: 90 tablet, Rfl: 3 .  Magnesium 200 MG TABS, Take by mouth., Disp: , Rfl:  .  montelukast (SINGULAIR) 10 MG tablet, TAKE 1 TABLET (10 MG TOTAL) BY MOUTH DAILY., Disp: 30 tablet, Rfl: 2 .  NON FORMULARY, CPAP at night, Disp: , Rfl:  .  verapamil (CALAN-SR) 240 MG CR tablet, TAKE 1 TABLET BY MOUTH EVERY DAY, Disp: 90 tablet,  Rfl: 3 .  amoxicillin-clavulanate (AUGMENTIN) 875-125 MG tablet, Take 1 tablet by mouth 2 (two) times daily., Disp: 20 tablet, Rfl: 0  Review of Systems  Constitutional: Negative.   Respiratory: Negative.   Cardiovascular: Negative.   Gastrointestinal: Negative.   Neurological: Positive for dizziness and light-headedness.  Hematological: Negative.   Psychiatric/Behavioral: Negative.   All other systems reviewed and are negative.   Social History   Tobacco Use  . Smoking status: Never Smoker  . Smokeless tobacco: Never Used  . Tobacco comment: former social smoker  Substance Use Topics  . Alcohol use: No    Alcohol/week: 0.0 standard drinks      Objective:   Temp 98.4 F (36.9 C)   Resp 16   Wt 264 lb (119.7 kg)   BMI 43.93 kg/m  Vitals:   12/23/18 1454  Resp: 16  Temp: 98.4 F (36.9 C)  Weight: 264 lb (119.7 kg)     Physical Exam Vitals signs reviewed.  Constitutional:      Appearance: She is obese.  HENT:     Head: Normocephalic and atraumatic.     Right Ear: External ear normal.     Left Ear: External ear normal.     Nose: Nose normal.  Eyes:  General: No scleral icterus. Neck:     Musculoskeletal: No neck rigidity.     Vascular: No carotid bruit.  Cardiovascular:     Rate and Rhythm: Normal rate and regular rhythm.  Pulmonary:     Effort: Pulmonary effort is normal.     Breath sounds: Normal breath sounds.  Abdominal:     Palpations: Abdomen is soft.  Lymphadenopathy:     Cervical: No cervical adenopathy.  Skin:    General: Skin is warm and dry.  Neurological:     General: No focal deficit present.     Mental Status: She is alert and oriented to person, place, and time. Mental status is at baseline.     Cranial Nerves: No cranial nerve deficit.     Comments: Mild nystagmus.  Psychiatric:        Mood and Affect: Mood normal.        Behavior: Behavior normal.        Thought Content: Thought content normal.        Judgment: Judgment  normal.   ECG reveal sinus brady at 50bpm,poor R wave progression.   No results found for any visits on 12/23/18.     Assessment & Plan    1. Dizziness/Vertigo Meclinzine prn and rest,push fluids. - EKG 12-Lead - meclizine (ANTIVERT) 25 MG tablet; Take 1 tablet (25 mg total) by mouth 3 (three) times daily as needed for dizziness.  Dispense: 60 tablet; Refill: 1  2. Dehydration Out of work until 12/25/18.  3. Essential hypertension   4. OSA (obstructive sleep apnea)      Wilhemena Durie, MD  [facility]

## 2019-02-02 DIAGNOSIS — D2261 Melanocytic nevi of right upper limb, including shoulder: Secondary | ICD-10-CM | POA: Diagnosis not present

## 2019-02-02 DIAGNOSIS — D2262 Melanocytic nevi of left upper limb, including shoulder: Secondary | ICD-10-CM | POA: Diagnosis not present

## 2019-02-02 DIAGNOSIS — L400 Psoriasis vulgaris: Secondary | ICD-10-CM | POA: Diagnosis not present

## 2019-02-02 DIAGNOSIS — Z85828 Personal history of other malignant neoplasm of skin: Secondary | ICD-10-CM | POA: Diagnosis not present

## 2019-02-02 DIAGNOSIS — L821 Other seborrheic keratosis: Secondary | ICD-10-CM | POA: Diagnosis not present

## 2019-02-02 DIAGNOSIS — D225 Melanocytic nevi of trunk: Secondary | ICD-10-CM | POA: Diagnosis not present

## 2019-02-02 DIAGNOSIS — D2271 Melanocytic nevi of right lower limb, including hip: Secondary | ICD-10-CM | POA: Diagnosis not present

## 2019-02-02 DIAGNOSIS — D2272 Melanocytic nevi of left lower limb, including hip: Secondary | ICD-10-CM | POA: Diagnosis not present

## 2019-02-02 DIAGNOSIS — Z08 Encounter for follow-up examination after completed treatment for malignant neoplasm: Secondary | ICD-10-CM | POA: Diagnosis not present

## 2019-02-16 NOTE — Progress Notes (Signed)
Patient: Sarah Marquez Female    DOB: 23-Jun-1955   63 y.o.   MRN: [id number] Visit Date: 02/17/2019  Today's Provider: Wilhemena Durie, MD   Chief Complaint  Patient presents with  . Follow-up   Subjective:     HPI  Patient retired from [employer] on February 06, 2019. Dizziness/Vertigo From 12/23/2018-Meclinzine prn and rest,push fluids. Resolved. - EKG 12-Lead  Dehydration From 12/23/2018-Out of work until 12/25/18.  Essential hypertension From 12/23/2018-  Allergies  Allergen Reactions  . Beclomethasone   . Sulfa Antibiotics   . Latex Rash  . Meloxicam Palpitations     Current Outpatient Medications:  .  albuterol (PROAIR HFA) 108 (90 Base) MCG/ACT inhaler, Inhale into the lungs., Disp: , Rfl:  .  beclomethasone (QVAR) 80 MCG/ACT inhaler, Inhale 2 puffs into the lungs 2 (two) times daily., Disp: , Rfl:  .  Cholecalciferol (VITAMIN D-1000 MAX ST) 1000 UNITS tablet, Take by mouth., Disp: , Rfl:  .  DEXILANT 60 MG capsule, Take 1 capsule by mouth daily., Disp: , Rfl: 11 .  losartan (COZAAR) 50 MG tablet, TAKE 1 TABLET BY MOUTH EVERY DAY, Disp: 90 tablet, Rfl: 3 .  Magnesium 200 MG TABS, Take by mouth., Disp: , Rfl:  .  montelukast (SINGULAIR) 10 MG tablet, TAKE 1 TABLET (10 MG TOTAL) BY MOUTH DAILY., Disp: 30 tablet, Rfl: 2 .  NON FORMULARY, CPAP at night, Disp: , Rfl:  .  verapamil (CALAN-SR) 240 MG CR tablet, TAKE 1 TABLET BY MOUTH EVERY DAY, Disp: 90 tablet, Rfl: 3 .  amoxicillin-clavulanate (AUGMENTIN) 875-125 MG tablet, Take 1 tablet by mouth 2 (two) times daily. (Patient not taking: Reported on 02/17/2019), Disp: 20 tablet, Rfl: 0 .  meclizine (ANTIVERT) 25 MG tablet, Take 1 tablet (25 mg total) by mouth 3 (three) times daily as needed for dizziness. (Patient not taking: Reported on 02/17/2019), Disp: 60 tablet, Rfl: 1  Review of Systems  Constitutional: Negative for appetite change, chills, fatigue and fever.  HENT: Negative.   Eyes:  Negative.   Respiratory: Negative for chest tightness and shortness of breath.   Cardiovascular: Negative for chest pain and palpitations.  Gastrointestinal: Negative for abdominal pain, nausea and vomiting.  Endocrine: Negative.   Allergic/Immunologic: Negative.   Neurological: Negative for dizziness and weakness.  Psychiatric/Behavioral: Negative.     Social History   Tobacco Use  . Smoking status: Never Smoker  . Smokeless tobacco: Never Used  . Tobacco comment: former social smoker  Substance Use Topics  . Alcohol use: No    Alcohol/week: 0.0 standard drinks      Objective:   BP 121/78 (BP Location: Left Arm, Patient Position: Sitting, Cuff Size: Large)   Pulse 69   Temp (!) 97.5 F (36.4 C) (Other (Comment))   Resp 16   Ht 5\' 5"  (1.651 m)   Wt 260 lb (117.9 kg)   SpO2 97%   BMI 43.27 kg/m  Vitals:   02/17/19 1056  BP: 121/78  Pulse: 69  Resp: 16  Temp: (!) 97.5 F (36.4 C)  TempSrc: Other (Comment)  SpO2: 97%  Weight: 260 lb (117.9 kg)  Height: 5\' 5"  (1.651 m)  Body mass index is 43.27 kg/m.   Physical Exam Vitals signs reviewed.  Constitutional:      Appearance: She is well-developed.  HENT:     Head: Normocephalic and atraumatic.  Eyes:     General: No scleral icterus.    Conjunctiva/sclera: Conjunctivae  normal.  Neck:     Thyroid: No thyromegaly.  Cardiovascular:     Rate and Rhythm: Normal rate and regular rhythm.     Heart sounds: Normal heart sounds.  Pulmonary:     Effort: Pulmonary effort is normal.     Breath sounds: Normal breath sounds.  Abdominal:     Palpations: Abdomen is soft.  Musculoskeletal:     Right lower leg: No edema.     Left lower leg: No edema.  Lymphadenopathy:     Cervical: No cervical adenopathy.  Skin:    General: Skin is warm and dry.  Neurological:     General: No focal deficit present.     Mental Status: She is alert and oriented to person, place, and time.  Psychiatric:        Mood and Affect: Mood  normal.        Behavior: Behavior normal.        Thought Content: Thought content normal.        Judgment: Judgment normal.      No results found for any visits on 02/17/19.     Assessment & Plan    1. Essential (primary) hypertension On verapamil. More than 50% 25 minute visit spent in counseling or coordination of care  - CBC w/Diff/Platelet - TSH - Comprehensive Metabolic Panel (CMET) - Lipid panel  2. Need for influenza vaccination  - Flu Vaccine QUAD High Dose(Fluad) - CBC w/Diff/Platelet - TSH - Comprehensive Metabolic Panel (CMET) - Lipid panel  3. Iron deficiency anemia, unspecified iron deficiency anemia type Also repeat CBC in 6 months. - CBC w/Diff/Platelet - TSH - Comprehensive Metabolic Panel (CMET) - Lipid panel  4. Seasonal allergic rhinitis, unspecified trigger  - CBC w/Diff/Platelet - TSH - Comprehensive Metabolic Panel (CMET) - Lipid panel  5. Gastroesophageal reflux disease with esophagitis without hemorrhage On Dexilant - CBC w/Diff/Platelet - TSH - Comprehensive Metabolic Panel (CMET) - Lipid panel  6. Hyperlipidemia, unspecified hyperlipidemia type Follow levels. - CBC w/Diff/Platelet - TSH - Comprehensive Metabolic Panel (CMET) - Lipid panel     Wilhemena Durie, MD  [facility]

## 2019-02-17 ENCOUNTER — Encounter: Payer: Self-pay | Admitting: Family Medicine

## 2019-02-17 ENCOUNTER — Ambulatory Visit (INDEPENDENT_AMBULATORY_CARE_PROVIDER_SITE_OTHER): Payer: 59 | Admitting: Family Medicine

## 2019-02-17 ENCOUNTER — Other Ambulatory Visit: Payer: Self-pay

## 2019-02-17 VITALS — BP 121/78 | HR 69 | Temp 97.5°F | Resp 16 | Ht 65.0 in | Wt 260.0 lb

## 2019-02-17 DIAGNOSIS — K2100 Gastro-esophageal reflux disease with esophagitis, without bleeding: Secondary | ICD-10-CM | POA: Diagnosis not present

## 2019-02-17 DIAGNOSIS — I10 Essential (primary) hypertension: Secondary | ICD-10-CM | POA: Diagnosis not present

## 2019-02-17 DIAGNOSIS — E785 Hyperlipidemia, unspecified: Secondary | ICD-10-CM | POA: Diagnosis not present

## 2019-02-17 DIAGNOSIS — Z23 Encounter for immunization: Secondary | ICD-10-CM

## 2019-02-17 DIAGNOSIS — J302 Other seasonal allergic rhinitis: Secondary | ICD-10-CM | POA: Diagnosis not present

## 2019-02-17 DIAGNOSIS — D509 Iron deficiency anemia, unspecified: Secondary | ICD-10-CM

## 2019-02-18 ENCOUNTER — Telehealth: Payer: Self-pay | Admitting: Family Medicine

## 2019-02-18 LAB — COMPREHENSIVE METABOLIC PANEL
ALT: 11 IU/L (ref 0–32)
AST: 25 IU/L (ref 0–40)
Albumin/Globulin Ratio: 1.3 (ref 1.2–2.2)
Albumin: 3.9 g/dL (ref 3.8–4.8)
Alkaline Phosphatase: 121 IU/L — ABNORMAL HIGH (ref 39–117)
BUN/Creatinine Ratio: 18 (ref 12–28)
BUN: 17 mg/dL (ref 8–27)
Bilirubin Total: 0.6 mg/dL (ref 0.0–1.2)
CO2: 25 mmol/L (ref 20–29)
Calcium: 9.2 mg/dL (ref 8.7–10.3)
Chloride: 104 mmol/L (ref 96–106)
Creatinine, Ser: 0.93 mg/dL (ref 0.57–1.00)
GFR calc Af Amer: 75 mL/min/{1.73_m2} (ref >59)
GFR calc non Af Amer: 65 mL/min/{1.73_m2} (ref >59)
Globulin, Total: 2.9 g/dL (ref 1.5–4.5)
Glucose: 86 mg/dL (ref 65–99)
Potassium: 4.2 mmol/L (ref 3.5–5.2)
Sodium: 140 mmol/L (ref 134–144)
Total Protein: 6.8 g/dL (ref 6.0–8.5)

## 2019-02-18 LAB — CBC WITH DIFFERENTIAL/PLATELET
Basophils Absolute: 0 10*3/uL (ref 0.0–0.2)
Basos: 1 %
EOS (ABSOLUTE): 0.6 10*3/uL — ABNORMAL HIGH (ref 0.0–0.4)
Eos: 8 %
Hematocrit: 40.3 % (ref 34.0–46.6)
Hemoglobin: 13.5 g/dL (ref 11.1–15.9)
Immature Grans (Abs): 0 10*3/uL (ref 0.0–0.1)
Immature Granulocytes: 0 %
Lymphocytes Absolute: 2 10*3/uL (ref 0.7–3.1)
Lymphs: 26 %
MCH: 28.4 pg (ref 26.6–33.0)
MCHC: 33.5 g/dL (ref 31.5–35.7)
MCV: 85 fL (ref 79–97)
Monocytes Absolute: 0.5 10*3/uL (ref 0.1–0.9)
Monocytes: 6 %
Neutrophils Absolute: 4.3 10*3/uL (ref 1.4–7.0)
Neutrophils: 59 %
Platelets: 221 10*3/uL (ref 150–450)
RBC: 4.76 x10E6/uL (ref 3.77–5.28)
RDW: 13.7 % (ref 11.7–15.4)
WBC: 7.4 10*3/uL (ref 3.4–10.8)

## 2019-02-18 LAB — LIPID PANEL
Chol/HDL Ratio: 5.2 ratio — ABNORMAL HIGH (ref 0.0–4.4)
Cholesterol, Total: 206 mg/dL — ABNORMAL HIGH (ref 100–199)
HDL: 40 mg/dL (ref >39)
LDL Chol Calc (NIH): 144 mg/dL — ABNORMAL HIGH (ref 0–99)
Triglycerides: 124 mg/dL (ref 0–149)
VLDL Cholesterol Cal: 22 mg/dL (ref 5–40)

## 2019-02-18 LAB — TSH: TSH: 2.75 u[IU]/mL (ref 0.450–4.500)

## 2019-02-18 NOTE — Telephone Encounter (Signed)
Please call pt back to give pt's lab results.  Thanks, [employer]

## 2019-03-23 DIAGNOSIS — H2511 Age-related nuclear cataract, right eye: Secondary | ICD-10-CM | POA: Diagnosis not present

## 2019-03-23 DIAGNOSIS — H353131 Nonexudative age-related macular degeneration, bilateral, early dry stage: Secondary | ICD-10-CM | POA: Diagnosis not present

## 2019-03-30 DIAGNOSIS — H2512 Age-related nuclear cataract, left eye: Secondary | ICD-10-CM | POA: Diagnosis not present

## 2019-03-30 DIAGNOSIS — G4733 Obstructive sleep apnea (adult) (pediatric): Secondary | ICD-10-CM | POA: Diagnosis not present

## 2019-04-02 ENCOUNTER — Other Ambulatory Visit: Payer: Self-pay

## 2019-04-03 ENCOUNTER — Other Ambulatory Visit
Admission: RE | Admit: 2019-04-03 | Discharge: 2019-04-03 | Disposition: A | Discharge status: Home / Self Care | Payer: 59 | Source: Ambulatory Visit | Attending: Ophthalmology | Admitting: Ophthalmology

## 2019-04-03 DIAGNOSIS — Z20828 Contact with and (suspected) exposure to other viral communicable diseases: Secondary | ICD-10-CM | POA: Insufficient documentation

## 2019-04-03 DIAGNOSIS — Z01812 Encounter for preprocedural laboratory examination: Secondary | ICD-10-CM | POA: Insufficient documentation

## 2019-04-03 LAB — SARS CORONAVIRUS 2 (TAT 6-24 HRS): SARS Coronavirus 2: NEGATIVE

## 2019-04-06 NOTE — Anesthesia Preprocedure Evaluation (Addendum)
Anesthesia Evaluation  Patient identified by MRN, date of birth, ID band Patient awake    Reviewed: Allergy & Precautions, NPO status , Patient's Chart, lab work & pertinent test results  History of Anesthesia Complications (+) DIFFICULT AIRWAY and history of anesthetic complications  Airway Mallampati: II   Neck ROM: Full    Dental  (+) Partial Upper   Pulmonary asthma , sleep apnea and Continuous Positive Airway Pressure Ventilation , former smoker (quit over 50 years ago),    Pulmonary exam normal breath sounds clear to auscultation       Cardiovascular hypertension, Normal cardiovascular exam Rhythm:Regular Rate:Normal     Neuro/Psych negative neurological ROS     GI/Hepatic PUD, GERD  ,  Endo/Other  Obesity   Renal/GU negative Renal ROS     Musculoskeletal  (+) Arthritis ,   Abdominal   Peds  Hematology  (+) Blood dyscrasia, anemia , Skin BCC   Anesthesia Other Findings   Reproductive/Obstetrics                            Anesthesia Physical Anesthesia Plan  ASA: III  Anesthesia Plan: MAC   Post-op Pain Management:    Induction: Intravenous  PONV Risk Score and Plan: 2 and TIVA, Midazolam and Treatment may vary due to age or medical condition  Airway Management Planned: Natural Airway  Additional Equipment:   Intra-op Plan:   Post-operative Plan:   Informed Consent: I have reviewed the patients History and Physical, chart, labs and discussed the procedure including the risks, benefits and alternatives for the proposed anesthesia with the patient or authorized representative who has indicated his/her understanding and acceptance.       Plan Discussed with: CRNA  Anesthesia Plan Comments:        Anesthesia Quick Evaluation

## 2019-04-06 NOTE — Discharge Instructions (Signed)

## 2019-04-07 ENCOUNTER — Ambulatory Visit: Payer: 59 | Admitting: Anesthesiology

## 2019-04-07 ENCOUNTER — Other Ambulatory Visit: Payer: Self-pay

## 2019-04-07 ENCOUNTER — Encounter
Admission: RE | Disposition: A | Discharge status: Home / Self Care | Payer: Self-pay | Source: Home / Self Care | Attending: Ophthalmology

## 2019-04-07 ENCOUNTER — Ambulatory Visit
Admission: RE | Admit: 2019-04-07 | Discharge: 2019-04-07 | Disposition: A | Discharge status: Home / Self Care | Payer: 59 | Attending: Ophthalmology | Admitting: Ophthalmology

## 2019-04-07 DIAGNOSIS — H25812 Combined forms of age-related cataract, left eye: Secondary | ICD-10-CM | POA: Diagnosis not present

## 2019-04-07 DIAGNOSIS — J45909 Unspecified asthma, uncomplicated: Secondary | ICD-10-CM | POA: Diagnosis not present

## 2019-04-07 DIAGNOSIS — H2512 Age-related nuclear cataract, left eye: Secondary | ICD-10-CM | POA: Insufficient documentation

## 2019-04-07 DIAGNOSIS — Z87891 Personal history of nicotine dependence: Secondary | ICD-10-CM | POA: Insufficient documentation

## 2019-04-07 DIAGNOSIS — G4730 Sleep apnea, unspecified: Secondary | ICD-10-CM | POA: Diagnosis not present

## 2019-04-07 DIAGNOSIS — Z881 Allergy status to other antibiotic agents status: Secondary | ICD-10-CM | POA: Insufficient documentation

## 2019-04-07 DIAGNOSIS — K219 Gastro-esophageal reflux disease without esophagitis: Secondary | ICD-10-CM | POA: Diagnosis not present

## 2019-04-07 DIAGNOSIS — Z882 Allergy status to sulfonamides status: Secondary | ICD-10-CM | POA: Diagnosis not present

## 2019-04-07 DIAGNOSIS — I10 Essential (primary) hypertension: Secondary | ICD-10-CM | POA: Insufficient documentation

## 2019-04-07 DIAGNOSIS — Z85828 Personal history of other malignant neoplasm of skin: Secondary | ICD-10-CM | POA: Insufficient documentation

## 2019-04-07 DIAGNOSIS — Z888 Allergy status to other drugs, medicaments and biological substances status: Secondary | ICD-10-CM | POA: Insufficient documentation

## 2019-04-07 DIAGNOSIS — M1990 Unspecified osteoarthritis, unspecified site: Secondary | ICD-10-CM | POA: Diagnosis not present

## 2019-04-07 DIAGNOSIS — Z79899 Other long term (current) drug therapy: Secondary | ICD-10-CM | POA: Diagnosis not present

## 2019-04-07 DIAGNOSIS — Z91040 Latex allergy status: Secondary | ICD-10-CM | POA: Insufficient documentation

## 2019-04-07 HISTORY — DX: Unspecified osteoarthritis, unspecified site: M19.90

## 2019-04-07 HISTORY — DX: Presence of dental prosthetic device (complete) (partial): Z97.2

## 2019-04-07 HISTORY — DX: Gout, unspecified: M10.9

## 2019-04-07 HISTORY — PX: CATARACT EXTRACTION W/PHACO: SHX586

## 2019-04-07 HISTORY — DX: Dizziness and giddiness: R42

## 2019-04-07 HISTORY — DX: Sleep apnea, unspecified: G47.30

## 2019-04-07 SURGERY — PHACOEMULSIFICATION, CATARACT, WITH IOL INSERTION
Anesthesia: Monitor Anesthesia Care | Site: Eye | Laterality: Left

## 2019-04-07 MED ORDER — NA CHONDROIT SULF-NA HYALURON 40-17 MG/ML IO SOLN
INTRAOCULAR | Status: DC | PRN
  Administered 2019-04-07: 1 mL via INTRAOCULAR

## 2019-04-07 MED ORDER — ACETAMINOPHEN 160 MG/5ML PO SOLN: 325.0000 mg | ORAL | Status: DC | PRN

## 2019-04-07 MED ORDER — ARMC OPHTHALMIC DILATING DROPS
1.0000 "application " | OPHTHALMIC | Status: DC | PRN
  Administered 2019-04-07 (×3): 1 via OPHTHALMIC

## 2019-04-07 MED ORDER — FENTANYL CITRATE (PF) 100 MCG/2ML IJ SOLN
INTRAMUSCULAR | Status: DC | PRN
  Administered 2019-04-07 (×2): 50 ug via INTRAVENOUS

## 2019-04-07 MED ORDER — MIDAZOLAM HCL 2 MG/2ML IJ SOLN
INTRAMUSCULAR | Status: DC | PRN
  Administered 2019-04-07 (×2): 1 mg via INTRAVENOUS

## 2019-04-07 MED ORDER — LIDOCAINE HCL (PF) 2 % IJ SOLN
INTRAOCULAR | Status: DC | PRN
  Administered 2019-04-07: 2 mL

## 2019-04-07 MED ORDER — TETRACAINE HCL 0.5 % OP SOLN
1.0000 [drp] | OPHTHALMIC | Status: DC | PRN
  Administered 2019-04-07 (×3): 1 [drp] via OPHTHALMIC

## 2019-04-07 MED ORDER — ONDANSETRON HCL 4 MG/2ML IJ SOLN: 4.0000 mg | Freq: Once | INTRAMUSCULAR | Status: DC | PRN

## 2019-04-07 MED ORDER — MOXIFLOXACIN HCL 0.5 % OP SOLN
OPHTHALMIC | Status: DC | PRN
  Administered 2019-04-07: 0.2 mL via OPHTHALMIC

## 2019-04-07 MED ORDER — BRIMONIDINE TARTRATE-TIMOLOL 0.2-0.5 % OP SOLN
OPHTHALMIC | Status: DC | PRN
  Administered 2019-04-07: 1 [drp] via OPHTHALMIC

## 2019-04-07 MED ORDER — EPINEPHRINE PF 1 MG/ML IJ SOLN
INTRAOCULAR | Status: DC | PRN
  Administered 2019-04-07: 50 mL via OPHTHALMIC

## 2019-04-07 MED ORDER — ACETAMINOPHEN 325 MG PO TABS: 650.0000 mg | ORAL_TABLET | Freq: Once | ORAL | Status: DC | PRN

## 2019-04-07 SURGICAL SUPPLY — 20 items
CANNULA ANT/CHMB 27G (MISCELLANEOUS) ×2 IMPLANT
CANNULA ANT/CHMB 27GA (MISCELLANEOUS) ×4 IMPLANT
GLOVE SURG LX 8.0 MICRO (GLOVE) ×1
GLOVE SURG LX STRL 8.0 MICRO (GLOVE) ×1 IMPLANT
GLOVE SURG TRIUMPH 8.0 PF LTX (GLOVE) ×2 IMPLANT
GOWN STRL REUS W/ TWL LRG LVL3 (GOWN DISPOSABLE) ×2 IMPLANT
GOWN STRL REUS W/TWL LRG LVL3 (GOWN DISPOSABLE) ×2
LENS IOL TECNIS ITEC 19.0 (Intraocular Lens) ×1 IMPLANT
MARKER SKIN DUAL TIP RULER LAB (MISCELLANEOUS) ×2 IMPLANT
NDL FILTER BLUNT 18X1 1/2 (NEEDLE) ×1 IMPLANT
NDL RETROBULBAR .5 NSTRL (NEEDLE) ×2 IMPLANT
NEEDLE FILTER BLUNT 18X 1/2SAF (NEEDLE) ×1
NEEDLE FILTER BLUNT 18X1 1/2 (NEEDLE) ×1 IMPLANT
PACK EYE AFTER SURG (MISCELLANEOUS) ×2 IMPLANT
PACK OPTHALMIC (MISCELLANEOUS) ×2 IMPLANT
PACK PORFILIO (MISCELLANEOUS) ×2 IMPLANT
SYR 3ML LL SCALE MARK (SYRINGE) ×2 IMPLANT
SYR TB 1ML LUER SLIP (SYRINGE) ×2 IMPLANT
WATER STERILE IRR 250ML POUR (IV SOLUTION) ×2 IMPLANT
WIPE NON LINTING 3.25X3.25 (MISCELLANEOUS) ×2 IMPLANT

## 2019-04-07 NOTE — Op Note (Signed)
PREOPERATIVE DIAGNOSIS:  Nuclear sclerotic cataract of the left eye.   POSTOPERATIVE DIAGNOSIS:  Nuclear sclerotic cataract of the left eye.   OPERATIVE PROCEDURE:@   SURGEON:  Birder Robson, MD.   ANESTHESIA:  Anesthesiologist: Darrin Nipper, MD CRNA: Georga Bora, CRNA  1.      Managed anesthesia care. 2.     0.20ml of Shugarcaine was instilled following the paracentesis   COMPLICATIONS:  None.   TECHNIQUE:   Stop and chop   DESCRIPTION OF PROCEDURE:  The patient was examined and consented in the preoperative holding area where the aforementioned topical anesthesia was applied to the left eye and then brought back to the Operating Room where the left eye was prepped and draped in the usual sterile ophthalmic fashion and a lid speculum was placed. A paracentesis was created with the side port blade and the anterior chamber was filled with viscoelastic. A near clear corneal incision was performed with the steel keratome. A continuous curvilinear capsulorrhexis was performed with a cystotome followed by the capsulorrhexis forceps. Hydrodissection and hydrodelineation were carried out with BSS on a blunt cannula. The lens was removed in a stop and chop  technique and the remaining cortical material was removed with the irrigation-aspiration handpiece. The capsular bag was inflated with viscoelastic and the Technis [id number] lens was placed in the capsular bag without complication. The remaining viscoelastic was removed from the eye with the irrigation-aspiration handpiece. The wounds were hydrated. The anterior chamber was flushed with BSS and the eye was inflated to physiologic pressure. 0.43ml Vigamox was placed in the anterior chamber. The wounds were found to be water tight. The eye was dressed with Combigan. The patient was given protective glasses to wear throughout the day and a shield with which to sleep tonight. The patient was also given drops with which to begin a drop regimen today and  will follow-up with me in one day. Implant Name Type Inv. Item Serial No. Manufacturer Lot No. LRB No. Used Action  LENS IOL DIOP 19.0 - [id number] Intraocular Lens LENS IOL DIOP 19.0 [id number]  Left 1 Implanted    Procedure(s) with comments: CATARACT EXTRACTION PHACO AND INTRAOCULAR LENS PLACEMENT (IOC) LEFT 4.43, 00:34.1 (Left) - Sleep Apnea-does not wear CPAP Latex-adhesives  Electronically signed: Birder Robson 04/07/2019 11:15 AM

## 2019-04-07 NOTE — H&P (Signed)
All labs reviewed. Abnormal studies sent to patients PCP when indicated.  Previous H&P reviewed, patient examined, there are NO CHANGES.  Sarah Will Porfilio12/8/202010:49 AM

## 2019-04-07 NOTE — Anesthesia Procedure Notes (Signed)
Procedure Name: MAC Date/Time: 04/07/2019 11:01 AM Performed by: Georga Bora, CRNA Pre-anesthesia Checklist: Patient identified, Emergency Drugs available, Patient being monitored, Timeout performed and Suction available Patient Re-evaluated:Patient Re-evaluated prior to induction Oxygen Delivery Method: Nasal cannula

## 2019-04-07 NOTE — Transfer of Care (Signed)
Immediate Anesthesia Transfer of Care Note  Patient: Sarah Marquez  Procedure(s) Performed: CATARACT EXTRACTION PHACO AND INTRAOCULAR LENS PLACEMENT (IOC) LEFT 4.43, 00:34.1 (Left Eye)  Patient Location: PACU  Anesthesia Type: MAC  Level of Consciousness: awake, alert  and patient cooperative  Airway and Oxygen Therapy: Patient Spontanous Breathing and Patient connected to supplemental oxygen  Post-op Assessment: Post-op Vital signs reviewed, Patient's Cardiovascular Status Stable, Respiratory Function Stable, Patent Airway and No signs of Nausea or vomiting  Post-op Vital Signs: Reviewed and stable  Complications: No apparent anesthesia complications

## 2019-04-07 NOTE — Anesthesia Postprocedure Evaluation (Signed)
Anesthesia Post Note  Patient: Sarah Marquez  Procedure(s) Performed: CATARACT EXTRACTION PHACO AND INTRAOCULAR LENS PLACEMENT (IOC) LEFT 4.43, 00:34.1 (Left Eye)     Patient location during evaluation: PACU Anesthesia Type: MAC Level of consciousness: awake and alert, oriented and patient cooperative Pain management: pain level controlled Vital Signs Assessment: post-procedure vital signs reviewed and stable Respiratory status: spontaneous breathing, nonlabored ventilation and respiratory function stable Cardiovascular status: blood pressure returned to baseline and stable Postop Assessment: adequate PO intake Anesthetic complications: no    Darrin Nipper

## 2019-04-07 NOTE — OR Nursing (Signed)
Spoke with  Dr. Linton Flemings regarding pts allery to Meloxicam, he interviewed pt and ok'd to continue with eye prep as ordered.

## 2019-04-08 ENCOUNTER — Encounter: Payer: Self-pay | Admitting: Ophthalmology

## 2019-04-16 DIAGNOSIS — I10 Essential (primary) hypertension: Secondary | ICD-10-CM | POA: Diagnosis not present

## 2019-04-16 DIAGNOSIS — H2511 Age-related nuclear cataract, right eye: Secondary | ICD-10-CM | POA: Diagnosis not present

## 2019-04-22 ENCOUNTER — Other Ambulatory Visit: Payer: Self-pay

## 2019-04-22 ENCOUNTER — Encounter: Payer: Self-pay | Admitting: Ophthalmology

## 2019-04-29 NOTE — Discharge Instructions (Signed)

## 2019-04-30 ENCOUNTER — Other Ambulatory Visit
Admission: RE | Admit: 2019-04-30 | Discharge: 2019-04-30 | Disposition: A | Discharge status: Home / Self Care | Payer: Managed Care, Other (non HMO) | Source: Ambulatory Visit | Attending: Ophthalmology | Admitting: Ophthalmology

## 2019-04-30 ENCOUNTER — Other Ambulatory Visit: Payer: Self-pay

## 2019-04-30 DIAGNOSIS — Z20828 Contact with and (suspected) exposure to other viral communicable diseases: Secondary | ICD-10-CM | POA: Diagnosis not present

## 2019-04-30 DIAGNOSIS — Z01812 Encounter for preprocedural laboratory examination: Secondary | ICD-10-CM | POA: Diagnosis not present

## 2019-05-01 LAB — SARS CORONAVIRUS 2 (TAT 6-24 HRS): SARS Coronavirus 2: NEGATIVE

## 2019-05-05 ENCOUNTER — Ambulatory Visit
Admission: RE | Admit: 2019-05-05 | Discharge: 2019-05-05 | Disposition: A | Discharge status: Home / Self Care | Payer: 59 | Attending: Ophthalmology | Admitting: Ophthalmology

## 2019-05-05 ENCOUNTER — Ambulatory Visit: Payer: 59 | Admitting: Anesthesiology

## 2019-05-05 ENCOUNTER — Other Ambulatory Visit: Payer: Self-pay

## 2019-05-05 ENCOUNTER — Encounter
Admission: RE | Disposition: A | Discharge status: Home / Self Care | Payer: Self-pay | Source: Home / Self Care | Attending: Ophthalmology

## 2019-05-05 ENCOUNTER — Encounter: Payer: Self-pay | Admitting: Ophthalmology

## 2019-05-05 DIAGNOSIS — Z6841 Body Mass Index (BMI) 40.0 and over, adult: Secondary | ICD-10-CM | POA: Insufficient documentation

## 2019-05-05 DIAGNOSIS — H25811 Combined forms of age-related cataract, right eye: Secondary | ICD-10-CM | POA: Diagnosis not present

## 2019-05-05 DIAGNOSIS — Z9849 Cataract extraction status, unspecified eye: Secondary | ICD-10-CM | POA: Diagnosis not present

## 2019-05-05 DIAGNOSIS — Z79899 Other long term (current) drug therapy: Secondary | ICD-10-CM | POA: Insufficient documentation

## 2019-05-05 DIAGNOSIS — G4730 Sleep apnea, unspecified: Secondary | ICD-10-CM | POA: Insufficient documentation

## 2019-05-05 DIAGNOSIS — E669 Obesity, unspecified: Secondary | ICD-10-CM | POA: Diagnosis not present

## 2019-05-05 DIAGNOSIS — Z888 Allergy status to other drugs, medicaments and biological substances status: Secondary | ICD-10-CM | POA: Diagnosis not present

## 2019-05-05 DIAGNOSIS — J45909 Unspecified asthma, uncomplicated: Secondary | ICD-10-CM | POA: Insufficient documentation

## 2019-05-05 DIAGNOSIS — Z85828 Personal history of other malignant neoplasm of skin: Secondary | ICD-10-CM | POA: Insufficient documentation

## 2019-05-05 DIAGNOSIS — I10 Essential (primary) hypertension: Secondary | ICD-10-CM | POA: Insufficient documentation

## 2019-05-05 DIAGNOSIS — Z882 Allergy status to sulfonamides status: Secondary | ICD-10-CM | POA: Diagnosis not present

## 2019-05-05 DIAGNOSIS — H2511 Age-related nuclear cataract, right eye: Secondary | ICD-10-CM | POA: Insufficient documentation

## 2019-05-05 DIAGNOSIS — Z87891 Personal history of nicotine dependence: Secondary | ICD-10-CM | POA: Diagnosis not present

## 2019-05-05 HISTORY — PX: CATARACT EXTRACTION W/PHACO: SHX586

## 2019-05-05 SURGERY — PHACOEMULSIFICATION, CATARACT, WITH IOL INSERTION
Anesthesia: Monitor Anesthesia Care | Site: Eye | Laterality: Right

## 2019-05-05 MED ORDER — NA CHONDROIT SULF-NA HYALURON 40-17 MG/ML IO SOLN
INTRAOCULAR | Status: DC | PRN
  Administered 2019-05-05: 1 mL via INTRAOCULAR

## 2019-05-05 MED ORDER — TETRACAINE HCL 0.5 % OP SOLN
1.0000 [drp] | OPHTHALMIC | Status: DC | PRN
  Administered 2019-05-05 (×3): 1 [drp] via OPHTHALMIC

## 2019-05-05 MED ORDER — ACETAMINOPHEN 160 MG/5ML PO SOLN: 325.0000 mg | Freq: Once | ORAL | Status: DC

## 2019-05-05 MED ORDER — LIDOCAINE HCL (PF) 2 % IJ SOLN
INTRAOCULAR | Status: DC | PRN
  Administered 2019-05-05: 2 mL

## 2019-05-05 MED ORDER — ARMC OPHTHALMIC DILATING DROPS
1.0000 "application " | OPHTHALMIC | Status: DC | PRN
  Administered 2019-05-05 (×3): 1 via OPHTHALMIC

## 2019-05-05 MED ORDER — MOXIFLOXACIN HCL 0.5 % OP SOLN
OPHTHALMIC | Status: DC | PRN
  Administered 2019-05-05: 0.2 mL via OPHTHALMIC

## 2019-05-05 MED ORDER — FENTANYL CITRATE (PF) 100 MCG/2ML IJ SOLN
INTRAMUSCULAR | Status: DC | PRN
  Administered 2019-05-05: 50 ug via INTRAVENOUS

## 2019-05-05 MED ORDER — EPINEPHRINE PF 1 MG/ML IJ SOLN
INTRAOCULAR | Status: DC | PRN
  Administered 2019-05-05: 52 mL via OPHTHALMIC

## 2019-05-05 MED ORDER — ACETAMINOPHEN 325 MG PO TABS: 325.0000 mg | ORAL_TABLET | Freq: Once | ORAL | Status: DC

## 2019-05-05 MED ORDER — BRIMONIDINE TARTRATE-TIMOLOL 0.2-0.5 % OP SOLN
OPHTHALMIC | Status: DC | PRN
  Administered 2019-05-05: 1 [drp] via OPHTHALMIC

## 2019-05-05 MED ORDER — MIDAZOLAM HCL 2 MG/2ML IJ SOLN
INTRAMUSCULAR | Status: DC | PRN
  Administered 2019-05-05: 2 mg via INTRAVENOUS

## 2019-05-05 SURGICAL SUPPLY — 20 items
CANNULA ANT/CHMB 27G (MISCELLANEOUS) ×2 IMPLANT
CANNULA ANT/CHMB 27GA (MISCELLANEOUS) ×4 IMPLANT
GLOVE SURG LX 8.0 MICRO (GLOVE) ×1
GLOVE SURG LX STRL 8.0 MICRO (GLOVE) ×1 IMPLANT
GLOVE SURG TRIUMPH 8.0 PF LTX (GLOVE) ×2 IMPLANT
GOWN STRL REUS W/ TWL LRG LVL3 (GOWN DISPOSABLE) ×2 IMPLANT
GOWN STRL REUS W/TWL LRG LVL3 (GOWN DISPOSABLE) ×2
LENS IOL TECNIS ITEC 20.0 (Intraocular Lens) ×1 IMPLANT
MARKER SKIN DUAL TIP RULER LAB (MISCELLANEOUS) ×2 IMPLANT
NDL FILTER BLUNT 18X1 1/2 (NEEDLE) ×1 IMPLANT
NDL RETROBULBAR .5 NSTRL (NEEDLE) ×2 IMPLANT
NEEDLE FILTER BLUNT 18X 1/2SAF (NEEDLE) ×1
NEEDLE FILTER BLUNT 18X1 1/2 (NEEDLE) ×1 IMPLANT
PACK EYE AFTER SURG (MISCELLANEOUS) ×2 IMPLANT
PACK OPTHALMIC (MISCELLANEOUS) ×2 IMPLANT
PACK PORFILIO (MISCELLANEOUS) ×2 IMPLANT
SYR 3ML LL SCALE MARK (SYRINGE) ×2 IMPLANT
SYR TB 1ML LUER SLIP (SYRINGE) ×2 IMPLANT
WATER STERILE IRR 250ML POUR (IV SOLUTION) ×2 IMPLANT
WIPE NON LINTING 3.25X3.25 (MISCELLANEOUS) ×2 IMPLANT

## 2019-05-05 NOTE — Transfer of Care (Signed)
Immediate Anesthesia Transfer of Care Note  Patient: Sarah Marquez  Procedure(s) Performed: CATARACT EXTRACTION PHACO AND INTRAOCULAR LENS PLACEMENT (IOC) RIGHT;  4.83, 00:33.4 (Right Eye)  Patient Location: PACU  Anesthesia Type: MAC  Level of Consciousness: awake, alert  and patient cooperative  Airway and Oxygen Therapy: Patient Spontanous Breathing and Patient connected to supplemental oxygen  Post-op Assessment: Post-op Vital signs reviewed, Patient's Cardiovascular Status Stable, Respiratory Function Stable, Patent Airway and No signs of Nausea or vomiting  Post-op Vital Signs: Reviewed and stable  Complications: No apparent anesthesia complications

## 2019-05-05 NOTE — Anesthesia Procedure Notes (Signed)
Procedure Name: MAC Performed by: Makalya Nave, CRNA Pre-anesthesia Checklist: Patient identified, Emergency Drugs available, Suction available, Timeout performed and Patient being monitored Patient Re-evaluated:Patient Re-evaluated prior to induction Oxygen Delivery Method: Nasal cannula Placement Confirmation: positive ETCO2       

## 2019-05-05 NOTE — H&P (Signed)
All labs reviewed. Abnormal studies sent to patients PCP when indicated.  Previous H&P reviewed, patient examined, there are NO CHANGES.  Sarah Hackman Porfilio1/5/202110:12 AM

## 2019-05-05 NOTE — Anesthesia Postprocedure Evaluation (Signed)
Anesthesia Post Note  Patient: Sarah Marquez  Procedure(s) Performed: CATARACT EXTRACTION PHACO AND INTRAOCULAR LENS PLACEMENT (IOC) RIGHT;  4.83, 00:33.4 (Right Eye)     Patient location during evaluation: PACU Anesthesia Type: MAC Level of consciousness: awake and alert and oriented Pain management: satisfactory to patient Vital Signs Assessment: post-procedure vital signs reviewed and stable Respiratory status: spontaneous breathing, nonlabored ventilation and respiratory function stable Cardiovascular status: blood pressure returned to baseline and stable Postop Assessment: Adequate PO intake and No signs of nausea or vomiting Anesthetic complications: no    Raliegh Ip

## 2019-05-05 NOTE — Op Note (Signed)
PREOPERATIVE DIAGNOSIS:  Nuclear sclerotic cataract of the right eye.   POSTOPERATIVE DIAGNOSIS:  H25.11 Cataract   OPERATIVE PROCEDURE:@   SURGEON:  Birder Robson, MD.   ANESTHESIA:  Anesthesiologist: Ronelle Nigh, MD CRNA: Mayme Genta, CRNA  1.      Managed anesthesia care. 2.      0.33ml of Shugarcaine was instilled in the eye following the paracentesis.   COMPLICATIONS:  None.   TECHNIQUE:   Stop and chop   DESCRIPTION OF PROCEDURE:  The patient was examined and consented in the preoperative holding area where the aforementioned topical anesthesia was applied to the right eye and then brought back to the Operating Room where the right eye was prepped and draped in the usual sterile ophthalmic fashion and a lid speculum was placed. A paracentesis was created with the side port blade and the anterior chamber was filled with viscoelastic. A near clear corneal incision was performed with the steel keratome. A continuous curvilinear capsulorrhexis was performed with a cystotome followed by the capsulorrhexis forceps. Hydrodissection and hydrodelineation were carried out with BSS on a blunt cannula. The lens was removed in a stop and chop  technique and the remaining cortical material was removed with the irrigation-aspiration handpiece. The capsular bag was inflated with viscoelastic and the Technis [id number]  lens was placed in the capsular bag without complication. The remaining viscoelastic was removed from the eye with the irrigation-aspiration handpiece. The wounds were hydrated. The anterior chamber was flushed with BSS and the eye was inflated to physiologic pressure. 0.62ml of Vigamox was placed in the anterior chamber. The wounds were found to be water tight. The eye was dressed with Combigan. The patient was given protective glasses to wear throughout the day and a shield with which to sleep tonight. The patient was also given drops with which to begin a drop regimen today and will  follow-up with me in one day. Implant Name Type Inv. Item Serial No. Manufacturer Lot No. LRB No. Used Action  LENS IOL DIOP 20.0 - [id number] Intraocular Lens LENS IOL DIOP 20.0 [id number]  Right 1 Implanted   Procedure(s) with comments: CATARACT EXTRACTION PHACO AND INTRAOCULAR LENS PLACEMENT (IOC) RIGHT;  4.83, 00:33.4 (Right) - SLEP APNEA-CPAP  Electronically signed: Birder Robson 05/05/2019 10:37 AM

## 2019-05-05 NOTE — Anesthesia Preprocedure Evaluation (Signed)
Anesthesia Evaluation  Patient identified by MRN, date of birth, ID band Patient awake    Reviewed: Allergy & Precautions, H&P , NPO status , Patient's Chart, lab work & pertinent test results  History of Anesthesia Complications (+) DIFFICULT AIRWAY and history of anesthetic complications  Airway Mallampati: II  TM Distance: >3 FB Neck ROM: full    Dental no notable dental hx. (+) Partial Upper   Pulmonary asthma , sleep apnea and Continuous Positive Airway Pressure Ventilation , former smoker (quit over 50 years ago),    Pulmonary exam normal breath sounds clear to auscultation       Cardiovascular hypertension, Normal cardiovascular exam Rhythm:regular Rate:Normal     Neuro/Psych negative neurological ROS     GI/Hepatic PUD, GERD  ,  Endo/Other  Obesity   Renal/GU negative Renal ROS     Musculoskeletal  (+) Arthritis ,   Abdominal   Peds  Hematology  (+) Blood dyscrasia, anemia , Skin BCC   Anesthesia Other Findings   Reproductive/Obstetrics                             Anesthesia Physical  Anesthesia Plan  ASA: III  Anesthesia Plan: MAC   Post-op Pain Management:    Induction: Intravenous  PONV Risk Score and Plan: 2 and TIVA, Midazolam and Treatment may vary due to age or medical condition  Airway Management Planned: Natural Airway  Additional Equipment:   Intra-op Plan:   Post-operative Plan:   Informed Consent: I have reviewed the patients History and Physical, chart, labs and discussed the procedure including the risks, benefits and alternatives for the proposed anesthesia with the patient or authorized representative who has indicated his/her understanding and acceptance.       Plan Discussed with: CRNA  Anesthesia Plan Comments:         Anesthesia Quick Evaluation

## 2019-05-06 ENCOUNTER — Encounter: Payer: Self-pay | Admitting: *Deleted

## 2019-06-15 ENCOUNTER — Other Ambulatory Visit: Payer: Self-pay | Admitting: Family Medicine

## 2019-06-15 NOTE — Telephone Encounter (Signed)
Requested Prescriptions  Pending Prescriptions Disp Refills  . verapamil (CALAN-SR) 240 MG CR tablet [Pharmacy Med Name: VERAPAMIL ER 240 MG TABLET] 90 tablet 3    Sig: TAKE 1 TABLET BY MOUTH EVERY DAY     Cardiovascular:  Calcium Channel Blockers Passed - 06/15/2019  1:14 AM      Passed - Last BP in normal range    BP Readings from Last 1 Encounters:  05/05/19 116/70         Passed - Valid encounter within last 6 months    Recent Outpatient Visits          3 months ago Essential (primary) hypertension   [facility] Jerrol Banana., MD   5 months ago [place] Jerrol Banana., MD   10 months ago Frontal sinusitis, unspecified chronicity   [facility] Jerrol Banana., MD   1 year ago Urinary incontinence, unspecified type   [facility] Jerrol Banana., MD   1 year ago Essential (primary) hypertension   [facility] Jerrol Banana., MD      Future Appointments            In 2 months Jerrol Banana., MD [facility] [place]

## 2019-06-24 DIAGNOSIS — L309 Dermatitis, unspecified: Secondary | ICD-10-CM | POA: Diagnosis not present

## 2019-07-02 ENCOUNTER — Ambulatory Visit: Payer: Self-pay | Attending: Internal Medicine

## 2019-07-02 ENCOUNTER — Other Ambulatory Visit: Payer: Self-pay

## 2019-07-02 DIAGNOSIS — Z23 Encounter for immunization: Secondary | ICD-10-CM | POA: Insufficient documentation

## 2019-07-02 NOTE — Progress Notes (Signed)
   Covid-19 Vaccination Clinic  Name:  Sarah Marquez    MRN: [id number] DOB: 07/07/1955  07/02/2019  Ms. Egnor was observed post Covid-19 immunization for 30 minutes based on pre-vaccination screening without incident. She was provided with Vaccine Information Sheet and instruction to access the V-Safe system.   Ms. Scheurer was instructed to call 911 with any severe reactions post vaccine: Marland Kitchen Difficulty breathing  . Swelling of face and throat  . A fast heartbeat  . A bad rash all over body  . Dizziness and weakness   Immunizations Administered    Name Date Dose VIS Date Route   Pfizer COVID-19 Vaccine 07/02/2019 11:39 AM 0.3 mL 04/10/2019 Intramuscular   Manufacturer: [place]   Lot: [id number]   [place]: [id number]

## 2019-07-23 ENCOUNTER — Ambulatory Visit: Payer: Self-pay | Attending: Internal Medicine

## 2019-07-23 DIAGNOSIS — Z23 Encounter for immunization: Secondary | ICD-10-CM

## 2019-07-23 NOTE — Progress Notes (Signed)
   Covid-19 Vaccination Clinic  Name:  Sarah Marquez    MRN: [id number] DOB: May 22, 1955  07/23/2019  Sarah Marquez was observed post Covid-19 immunization for 15 minutes without incident. She was provided with Vaccine Information Sheet and instruction to access the V-Safe system.   Sarah Marquez was instructed to call 911 with any severe reactions post vaccine: Marland Kitchen Difficulty breathing  . Swelling of face and throat  . A fast heartbeat  . A bad rash all over body  . Dizziness and weakness   Immunizations Administered    Name Date Dose VIS Date Route   Pfizer COVID-19 Vaccine 07/23/2019 11:11 AM 0.3 mL 04/10/2019 Intramuscular   Manufacturer: [employer]   Lot: [id number]   [place]: [id number]

## 2019-08-17 DIAGNOSIS — L508 Other urticaria: Secondary | ICD-10-CM | POA: Diagnosis not present

## 2019-08-17 NOTE — Progress Notes (Deleted)
Complete physical exam  I,Sarah Marquez,acting as a scribe for Sarah Durie, MD.,have documented all relevant documentation on the behalf of Sarah Durie, MD,as directed by  Sarah Durie, MD while in the presence of Sarah Durie, MD.    Patient: Sarah Marquez   DOB: 01-17-1956   64 y.o. Female  MRN: [id number] Visit Date: 08/17/2019  Today's healthcare provider: Wilhemena Durie, MD  Subjective:   No chief complaint on file.   Sarah Marquez is a 64 y.o. female who presents today for a complete physical exam.  She reports consuming a {diet types:17450} diet. {Exercise:19826} She generally feels {well/fairly well/poorly:18703}. She reports sleeping {well/fairly well/poorly:18703}. She {does/does not:200015} have additional problems to discuss today.  HPI  Colonoscopy/EGD 6/19-normal follow up 5 years. Mammogram 09/2018-normal PAP 12/2017 normal  Past Medical History:  Diagnosis Date  . Arthritis    neck, knees  . Cancer ([place])    skin, basal cell removed from neck, back and foot  . Carpal tunnel syndrome   . Colon polyps   . Difficult intubation   . Gastric ulcer   . GERD (gastroesophageal reflux disease)    on dexilant  . Gout   . Hyperlipidemia   . Hypertension    controlled on meds  . Iron deficiency anemia   . Sleep apnea    CPAP  . Vertigo    last episode 12/2018  . Wears dentures    partial upper   Past Surgical History:  Procedure Laterality Date  . ABDOMINAL HYSTERECTOMY    . APPENDECTOMY    . BACK SURGERY     x2 FOR RUPTURED DISC  . CARDIAC CATHETERIZATION  1990's  . CATARACT EXTRACTION W/PHACO Left 04/07/2019   Procedure: CATARACT EXTRACTION PHACO AND INTRAOCULAR LENS PLACEMENT (IOC) LEFT 4.43, 00:34.1;  Surgeon: Birder Robson, MD;  Location: [facility];  Service: Ophthalmology;  Laterality: Left;  Sleep Apnea-does not wear CPAP Latex-adhesives  . CATARACT EXTRACTION W/PHACO Right 05/05/2019   Procedure: CATARACT  EXTRACTION PHACO AND INTRAOCULAR LENS PLACEMENT (IOC) RIGHT;  4.83, 00:33.4;  Surgeon: Birder Robson, MD;  Location: [facility];  Service: Ophthalmology;  Laterality: Right;  SLEP APNEA-CPAP  . CHOLECYSTECTOMY    . COLONOSCOPY    . COLONOSCOPY WITH PROPOFOL N/A 10/16/2017   Procedure: COLONOSCOPY WITH PROPOFOL;  Surgeon: Manya Silvas, MD;  [facility] ENDOSCOPY;  Service: Endoscopy;  Laterality: N/A;  . ESOPHAGOGASTRODUODENOSCOPY    . ESOPHAGOGASTRODUODENOSCOPY (EGD) WITH PROPOFOL N/A 10/16/2017   Procedure: ESOPHAGOGASTRODUODENOSCOPY (EGD) WITH PROPOFOL;  Surgeon: Manya Silvas, MD;  Location[facility] ENDOSCOPY;  Service: Endoscopy;  Laterality: N/A;  . FUNCTIONAL ENDOSCOPIC SINUS SURGERY    . knee arthoscopy    . LUMBAR DISC SURGERY     ruptured disc x 2  . TONSILLECTOMY AND ADENOIDECTOMY     Social History   Socioeconomic History  . Marital status: Married    Spouse name: Not on file  . Number of children: Not on file  . Years of education: Not on file  . Highest education level: Not on file  Occupational History  . Not on file  Tobacco Use  . Smoking status: Never Smoker  . Smokeless tobacco: Never Used  . Tobacco comment: former social smoker  Substance and Sexual Activity  . Alcohol use: No    Alcohol/week: 0.0 standard drinks  . Drug use: No  . Sexual activity: Yes    Birth control/protection: Post-menopausal  Other Topics Concern  .  Not on file  Social History Narrative  . Not on file   Social Determinants of Health   Financial Resource Strain:   . Difficulty of Paying Living Expenses:   Food Insecurity:   . Worried About Charity fundraiser in the Last Year:   . Arboriculturist in the Last Year:   Transportation Needs:   . Film/video editor (Medical):   Marland Kitchen Lack of Transportation (Non-Medical):   Physical Activity:   . Days of Exercise per Week:   . Minutes of Exercise per Session:   Stress:   . Feeling of Stress :   Social  Connections:   . Frequency of Communication with Friends and Family:   . Frequency of Social Gatherings with Friends and Family:   . Attends Religious Services:   . Active Member of Clubs or Organizations:   . Attends Archivist Meetings:   Marland Kitchen Marital Status:   Intimate Partner Violence:   . Fear of Current or Ex-Partner:   . Emotionally Abused:   Marland Kitchen Physically Abused:   . Sexually Abused:    Family Status  Relation Name Status  . Mother  Deceased at age 87  . Father  Deceased at age 63  . Brother  Deceased at age 33  . Brother  Deceased   Family History  Problem Relation Age of Onset  . Breast cancer Mother 40  . Heart attack Father   . Heart attack Brother    Allergies  Allergen Reactions  . Beclomethasone Swelling    Beconase Nose spray swelled face up  . Sulfa Antibiotics Hives  . Latex Rash    Adhesives, bandaids  . Meloxicam Palpitations    Patient Care Team: Sarah Marquez., MD as PCP - General (Family Medicine) Sarah Marquez., MD (Family Medicine)   Medications: Outpatient Medications Prior to Visit  Medication Sig  . albuterol (PROAIR HFA) 108 (90 Base) MCG/ACT inhaler Inhale into the lungs as needed.   Marland Kitchen amoxicillin-clavulanate (AUGMENTIN) 875-125 MG tablet Take 1 tablet by mouth 2 (two) times daily. (Patient not taking: Reported on 02/17/2019)  . beclomethasone (QVAR) 80 MCG/ACT inhaler Inhale 2 puffs into the lungs 2 (two) times daily.  . Cholecalciferol (VITAMIN D-1000 MAX ST) 1000 UNITS tablet Take by mouth.  . DEXILANT 60 MG capsule Take 1 capsule by mouth daily. am  . losartan (COZAAR) 50 MG tablet TAKE 1 TABLET BY MOUTH EVERY DAY (Patient taking differently: 50 mg. am)  . Magnesium 200 MG TABS Take by mouth.  . meclizine (ANTIVERT) 25 MG tablet Take 1 tablet (25 mg total) by mouth 3 (three) times daily as needed for dizziness. (Patient not taking: Reported on 02/17/2019)  . montelukast (SINGULAIR) 10 MG tablet TAKE 1 TABLET (10  MG TOTAL) BY MOUTH DAILY. (Patient taking differently: Take 10 mg by mouth daily. am)  . naproxen sodium (ALEVE) 220 MG tablet Take 220 mg by mouth daily as needed.  . NON FORMULARY CPAP at night  . verapamil (CALAN-SR) 240 MG CR tablet TAKE 1 TABLET BY MOUTH EVERY DAY   No facility-administered medications prior to visit.    Review of Systems  {Show previous labs (optional):23779::" "}    Objective:    There were no vitals taken for this visit. {Show previous vital signs (optional):23777::" "}  Physical Exam  ***  Depression Screen  PHQ 2/9 Scores 02/17/2019 01/14/2018 08/23/2016  PHQ - 2 Score 0 0 0  PHQ- 9 Score  0 0 1    No results found for any visits on 08/18/19.    Assessment & Plan:    Routine Health Maintenance and Physical Exam  Exercise Activities and Dietary recommendations Goals   None     Immunization History  Administered Date(s) Administered  . Fluad Quad(high Dose 65+) 02/17/2019  . Influenza,inj,Quad PF,6+ Mos 02/16/2017, 01/14/2018  . PFIZER SARS-COV-2 Vaccination 07/02/2019, 07/23/2019  . Tdap 02/05/2007    Health Maintenance  Topic Date Due  . Hepatitis C Screening  Never done  . HIV Screening  Never done  . TETANUS/TDAP  02/04/2017  . DEXA SCAN  Never done  . PNA vac Low Risk Adult (1 of 2 - PCV13) Never done  . INFLUENZA VACCINE  11/29/2019  . MAMMOGRAM  10/05/2020  . PAP SMEAR-Modifier  01/15/2021  . COLONOSCOPY  10/17/2027  . COVID-19 Vaccine  Completed    Discussed health benefits of physical activity, and encouraged her to engage in regular exercise appropriate for her age and condition.  ***  No follow-ups on file.     {provider attestation***:1}   Sarah Durie, MD  [facility] [phone] (phone) [phone] (fax)  [place]

## 2019-08-18 ENCOUNTER — Other Ambulatory Visit: Payer: Self-pay

## 2019-08-18 ENCOUNTER — Ambulatory Visit (INDEPENDENT_AMBULATORY_CARE_PROVIDER_SITE_OTHER): Payer: PPO | Admitting: Family Medicine

## 2019-08-18 ENCOUNTER — Encounter: Payer: Self-pay | Admitting: Family Medicine

## 2019-08-18 VITALS — BP 121/73 | HR 52 | Temp 97.3°F | Resp 16 | Ht 65.0 in | Wt 267.0 lb

## 2019-08-18 DIAGNOSIS — D509 Iron deficiency anemia, unspecified: Secondary | ICD-10-CM | POA: Diagnosis not present

## 2019-08-18 DIAGNOSIS — Z6841 Body Mass Index (BMI) 40.0 and over, adult: Secondary | ICD-10-CM | POA: Diagnosis not present

## 2019-08-18 DIAGNOSIS — E785 Hyperlipidemia, unspecified: Secondary | ICD-10-CM

## 2019-08-18 DIAGNOSIS — I10 Essential (primary) hypertension: Secondary | ICD-10-CM

## 2019-08-18 NOTE — Progress Notes (Signed)
Established patient visit  I,April Miller,acting as a scribe for Wilhemena Durie, MD.,have documented all relevant documentation on the behalf of Wilhemena Durie, MD,as directed by  Wilhemena Durie, MD while in the presence of Wilhemena Durie, MD.    Patient: Sarah Marquez   DOB: 03-04-1956   64 y.o. Female  MRN: [id number] Visit Date: 08/18/2019  Today's healthcare provider: Wilhemena Durie, MD   Chief Complaint  Patient presents with  . Follow-up   Subjective    HPI Patient overall feels well.  She is retired from her work at the [employer] as of this year.  Her husband is retired from the city of [employer]. She has no complaints. Hypertension, follow-up  BP Readings from Last 3 Encounters:  08/18/19 121/73  05/05/19 116/70  04/07/19 135/66   Wt Readings from Last 3 Encounters:  08/18/19 267 lb (121.1 kg)  05/05/19 261 lb (118.4 kg)  04/07/19 260 lb (117.9 kg)     She was last seen for hypertension 6 months ago.  BP at that visit was 121/78. Management since that visit includes; no changes were made. She reports good compliance with treatment. She is not having side effects. none She is following a Regular diet. She some exercising. She does not smoke.  Use of agents associated with hypertension: amphetamines.  Prn zrytec and Aleve  Outside blood pressures are not checking. Symptoms:  Pertinent labs: Lab Results  Component Value Date   CHOL 206 (H) 02/17/2019   HDL 40 02/17/2019   LDLCALC 144 (H) 02/17/2019   TRIG 124 02/17/2019   CHOLHDL 5.2 (H) 02/17/2019   Lab Results  Component Value Date   NA 140 02/17/2019   K 4.2 02/17/2019   CO2 25 02/17/2019   GLUCOSE 86 02/17/2019   BUN 17 02/17/2019   CREATININE 0.93 02/17/2019   CALCIUM 9.2 02/17/2019   GFRNONAA 65 02/17/2019   GFRAA 75 02/17/2019     The ASCVD Risk score (Goff DC Jr., et al., 2013) failed to calculate for the following reasons:   Unable to determine if patient is  Non-Hispanic African American   --------------------------------------------------------------------       Medications: Outpatient Medications Prior to Visit  Medication Sig  . beclomethasone (QVAR) 80 MCG/ACT inhaler Inhale 2 puffs into the lungs 2 (two) times daily.  Marland Kitchen DEXILANT 60 MG capsule Take 1 capsule by mouth daily. am  . losartan (COZAAR) 50 MG tablet TAKE 1 TABLET BY MOUTH EVERY DAY  . Magnesium 200 MG TABS Take by mouth.  . naproxen sodium (ALEVE) 220 MG tablet Take 220 mg by mouth daily as needed.  . NON FORMULARY CPAP at night  . verapamil (CALAN-SR) 240 MG CR tablet TAKE 1 TABLET BY MOUTH EVERY DAY  . albuterol (PROAIR HFA) 108 (90 Base) MCG/ACT inhaler Inhale into the lungs as needed.   Marland Kitchen amoxicillin-clavulanate (AUGMENTIN) 875-125 MG tablet Take 1 tablet by mouth 2 (two) times daily. (Patient not taking: Reported on 02/17/2019)  . Cholecalciferol (VITAMIN D-1000 MAX ST) 1000 UNITS tablet Take by mouth.  . meclizine (ANTIVERT) 25 MG tablet Take 1 tablet (25 mg total) by mouth 3 (three) times daily as needed for dizziness. (Patient not taking: Reported on 02/17/2019)  . montelukast (SINGULAIR) 10 MG tablet TAKE 1 TABLET (10 MG TOTAL) BY MOUTH DAILY. (Patient taking differently: Take 10 mg by mouth daily. am)   No facility-administered medications prior to visit.    Review of Systems  Constitutional: Negative  for appetite change, chills, fatigue and fever.  HENT: Negative.   Eyes: Negative.   Respiratory: Negative for chest tightness and shortness of breath.   Cardiovascular: Negative for chest pain and palpitations.  Gastrointestinal: Negative for abdominal pain, nausea and vomiting.  Endocrine: Negative.   Genitourinary: Negative.   Allergic/Immunologic: Negative.   Neurological: Negative for dizziness and weakness.  Hematological: Negative.   Psychiatric/Behavioral: Negative.         Objective    BP 121/73 (BP Location: Right Arm, Patient Position:  Sitting, Cuff Size: Large)   Pulse (!) 52   Temp (!) 97.3 F (36.3 C) (Other (Comment))   Resp 16   Ht 5\' 5"  (1.651 m)   Wt 267 lb (121.1 kg)   SpO2 98%   BMI 44.43 kg/m  BP Readings from Last 3 Encounters:  08/18/19 121/73  05/05/19 116/70  04/07/19 135/66   Wt Readings from Last 3 Encounters:  08/18/19 267 lb (121.1 kg)  05/05/19 261 lb (118.4 kg)  04/07/19 260 lb (117.9 kg)      Physical Exam Vitals reviewed. Exam conducted with a chaperone present.  Constitutional:      Appearance: Normal appearance.  HENT:     Right Ear: External ear normal.     Left Ear: External ear normal.  Eyes:     General: No scleral icterus.    Conjunctiva/sclera: Conjunctivae normal.  Cardiovascular:     Rate and Rhythm: Normal rate and regular rhythm.     Pulses: Normal pulses.     Heart sounds: Normal heart sounds.  Pulmonary:     Effort: Pulmonary effort is normal.     Breath sounds: Normal breath sounds.  Abdominal:     Palpations: Abdomen is soft.  Musculoskeletal:        General: Normal range of motion.     Right lower leg: Edema present.     Left lower leg: Edema present.  Neurological:     General: No focal deficit present.     Mental Status: She is alert and oriented to person, place, and time.  Psychiatric:        Mood and Affect: Mood normal.        Behavior: Behavior normal.        Thought Content: Thought content normal.        Judgment: Judgment normal.       No results found for any visits on 08/18/19.   Assessment & Plan    1. Essential hypertension Controlled on losartan and verapamil - Lipid panel - Comprehensive metabolic panel - TSH - CBC with Differential/Platelet  2. Hyperlipidemia, unspecified hyperlipidemia type Check lipid profile - Lipid panel - Comprehensive metabolic panel - TSH - CBC with Differential/Platelet  3. Iron deficiency anemia, unspecified iron deficiency anemia type History of iron deficiency anemia.  Recheck CBC today  while getting other lab work. - Lipid panel - Comprehensive metabolic panel - TSH - CBC with Differential/Platelet  4. Class 3 severe obesity due to excess calories with serious comorbidity and body mass index (BMI) of 45.0 to 49.9 in adult [facility]) She has gained 7 pounds over the past 6 months.  Encourage diet and exercise now that she is retired. Complete physical this fall.  No follow-ups on file.         Maggy Wyble Cranford Mon, MD  [facility] [phone] (phone) [phone] (fax)  [place]

## 2019-08-19 ENCOUNTER — Telehealth: Payer: Self-pay

## 2019-08-19 LAB — COMPREHENSIVE METABOLIC PANEL
ALT: 11 IU/L (ref 0–32)
AST: 26 IU/L (ref 0–40)
Albumin/Globulin Ratio: 1.2 (ref 1.2–2.2)
Albumin: 3.7 g/dL — ABNORMAL LOW (ref 3.8–4.8)
Alkaline Phosphatase: 114 IU/L (ref 39–117)
BUN/Creatinine Ratio: 18 (ref 12–28)
BUN: 16 mg/dL (ref 8–27)
Bilirubin Total: 0.7 mg/dL (ref 0.0–1.2)
CO2: 23 mmol/L (ref 20–29)
Calcium: 9.5 mg/dL (ref 8.7–10.3)
Chloride: 102 mmol/L (ref 96–106)
Creatinine, Ser: 0.91 mg/dL (ref 0.57–1.00)
GFR calc Af Amer: 77 mL/min/{1.73_m2} (ref >59)
GFR calc non Af Amer: 66 mL/min/{1.73_m2} (ref >59)
Globulin, Total: 3.1 g/dL (ref 1.5–4.5)
Glucose: 79 mg/dL (ref 65–99)
Potassium: 4.5 mmol/L (ref 3.5–5.2)
Sodium: 140 mmol/L (ref 134–144)
Total Protein: 6.8 g/dL (ref 6.0–8.5)

## 2019-08-19 LAB — TSH: TSH: 1.7 u[IU]/mL (ref 0.450–4.500)

## 2019-08-19 LAB — LIPID PANEL
Chol/HDL Ratio: 5.1 ratio — ABNORMAL HIGH (ref 0.0–4.4)
Cholesterol, Total: 211 mg/dL — ABNORMAL HIGH (ref 100–199)
HDL: 41 mg/dL (ref >39)
LDL Chol Calc (NIH): 147 mg/dL — ABNORMAL HIGH (ref 0–99)
Triglycerides: 128 mg/dL (ref 0–149)
VLDL Cholesterol Cal: 23 mg/dL (ref 5–40)

## 2019-08-19 LAB — CBC WITH DIFFERENTIAL/PLATELET
Basophils Absolute: 0 10*3/uL (ref 0.0–0.2)
Basos: 1 %
EOS (ABSOLUTE): 0.4 10*3/uL (ref 0.0–0.4)
Eos: 7 %
Hematocrit: 41.8 % (ref 34.0–46.6)
Hemoglobin: 13.7 g/dL (ref 11.1–15.9)
Immature Grans (Abs): 0 10*3/uL (ref 0.0–0.1)
Immature Granulocytes: 0 %
Lymphocytes Absolute: 1.9 10*3/uL (ref 0.7–3.1)
Lymphs: 29 %
MCH: 28.8 pg (ref 26.6–33.0)
MCHC: 32.8 g/dL (ref 31.5–35.7)
MCV: 88 fL (ref 79–97)
Monocytes Absolute: 0.4 10*3/uL (ref 0.1–0.9)
Monocytes: 7 %
Neutrophils Absolute: 3.6 10*3/uL (ref 1.4–7.0)
Neutrophils: 56 %
Platelets: 186 10*3/uL (ref 150–450)
RBC: 4.76 x10E6/uL (ref 3.77–5.28)
RDW: 13.9 % (ref 11.7–15.4)
WBC: 6.4 10*3/uL (ref 3.4–10.8)

## 2019-08-19 NOTE — Telephone Encounter (Signed)
-----   Message from Jerrol Banana., MD sent at 08/19/2019  3:51 PM EDT ----- Labs stable.

## 2019-08-19 NOTE — Telephone Encounter (Signed)
Patient advised that labs were stable.

## 2019-10-06 DIAGNOSIS — D2271 Melanocytic nevi of right lower limb, including hip: Secondary | ICD-10-CM | POA: Diagnosis not present

## 2019-10-06 DIAGNOSIS — D2261 Melanocytic nevi of right upper limb, including shoulder: Secondary | ICD-10-CM | POA: Diagnosis not present

## 2019-10-06 DIAGNOSIS — Z85828 Personal history of other malignant neoplasm of skin: Secondary | ICD-10-CM | POA: Diagnosis not present

## 2019-10-06 DIAGNOSIS — L501 Idiopathic urticaria: Secondary | ICD-10-CM | POA: Diagnosis not present

## 2019-10-06 DIAGNOSIS — L400 Psoriasis vulgaris: Secondary | ICD-10-CM | POA: Diagnosis not present

## 2019-10-06 DIAGNOSIS — L821 Other seborrheic keratosis: Secondary | ICD-10-CM | POA: Diagnosis not present

## 2019-10-06 DIAGNOSIS — D225 Melanocytic nevi of trunk: Secondary | ICD-10-CM | POA: Diagnosis not present

## 2019-10-07 ENCOUNTER — Telehealth: Payer: Self-pay

## 2019-10-07 NOTE — Telephone Encounter (Signed)
Copied from [place] [id number]. Topic: General - Inquiry >> Oct 07, 2019  2:02 PM Alease Frame wrote: Reason for CRM: Maudie Mercury from Dr Johnnette Litter general dentist . They will be sending over a fax regarding pt . Please be on the look out for fax that will be sent over today [phone]

## 2019-10-08 DIAGNOSIS — J45991 Cough variant asthma: Secondary | ICD-10-CM | POA: Diagnosis not present

## 2019-10-13 NOTE — Telephone Encounter (Signed)
Fax received

## 2019-10-28 ENCOUNTER — Other Ambulatory Visit: Payer: Self-pay | Admitting: Family Medicine

## 2019-10-28 DIAGNOSIS — Z1231 Encounter for screening mammogram for malignant neoplasm of breast: Secondary | ICD-10-CM

## 2019-11-11 ENCOUNTER — Ambulatory Visit
Admission: RE | Admit: 2019-11-11 | Discharge: 2019-11-11 | Disposition: A | Discharge status: Home / Self Care | Payer: PPO | Source: Ambulatory Visit | Attending: Family Medicine | Admitting: Family Medicine

## 2019-11-11 DIAGNOSIS — Z1231 Encounter for screening mammogram for malignant neoplasm of breast: Secondary | ICD-10-CM | POA: Insufficient documentation

## 2019-11-23 NOTE — Progress Notes (Signed)
Medicare Initial Preventative Physical Exam     Patient: Sarah Marquez, Female    DOB: 1956-01-27, 64 y.o.   MRN: [id number] Visit Date: 11/26/2019  Today's Provider: Wilhemena Durie, MD   Mertie Moores as a scribe for Wilhemena Durie, MD.,have documented all relevant documentation on the behalf of Wilhemena Durie, MD,as directed by  Wilhemena Durie, MD while in the presence of Wilhemena Durie, MD.  Subjective:    Chief Complaint  Patient presents with  . Welcome to [facility]    Medicare Initial Preventative Physical Exam Sarah Marquez is a 64 y.o. female who presents today for her Initial Preventative Physical Exam.  She sees [facility] next week for her well woman exam.  She is now retired.  Her husband also retired. HPI  Social History   Socioeconomic History  . Marital status: Married    Spouse name: Not on file  . Number of children: Not on file  . Years of education: Not on file  . Highest education level: Not on file  Occupational History  . Not on file  Tobacco Use  . Smoking status: Never Smoker  . Smokeless tobacco: Never Used  . Tobacco comment: former social smoker  Vaping Use  . Vaping Use: Never used  Substance and Sexual Activity  . Alcohol use: No    Alcohol/week: 0.0 standard drinks  . Drug use: No  . Sexual activity: Yes    Birth control/protection: Post-menopausal  Other Topics Concern  . Not on file  Social History Narrative  . Not on file   Social Determinants of Health   Financial Resource Strain:   . Difficulty of Paying Living Expenses:   Food Insecurity:   . Worried About Charity fundraiser in the Last Year:   . Arboriculturist in the Last Year:   Transportation Needs:   . Film/video editor (Medical):   Marland Kitchen Lack of Transportation (Non-Medical):   Physical Activity:   . Days of Exercise per Week:   . Minutes of Exercise per Session:   Stress:   . Feeling of Stress :   Social Connections:   .  Frequency of Communication with Friends and Family:   . Frequency of Social Gatherings with Friends and Family:   . Attends Religious Services:   . Active Member of Clubs or Organizations:   . Attends Archivist Meetings:   Marland Kitchen Marital Status:   Intimate Partner Violence:   . Fear of Current or Ex-Partner:   . Emotionally Abused:   Marland Kitchen Physically Abused:   . Sexually Abused:     Past Medical History:  Diagnosis Date  . Arthritis    neck, knees  . Cancer ([place])    skin, basal cell removed from neck, back and foot  . Carpal tunnel syndrome   . Colon polyps   . Difficult intubation   . Gastric ulcer   . GERD (gastroesophageal reflux disease)    on dexilant  . Gout   . Hyperlipidemia   . Hypertension    controlled on meds  . Iron deficiency anemia   . Sleep apnea    CPAP  . Vertigo    last episode 12/2018  . Wears dentures    partial upper     Patient Active Problem List   Diagnosis Date Noted  . Cough variant asthma 12/27/2015  . Dyspnea 11/14/2015  . OSA (obstructive sleep apnea) 11/10/2015  . Recurrent URI (  upper respiratory infection) 11/10/2015  . Cough 11/10/2015  . Benign neoplasm of colon 01/19/2015  . Allergic rhinitis 01/19/2015  . Absolute anemia 01/19/2015  . Colon polyp 01/19/2015  . Ankle bruise 01/19/2015  . Dysmetabolic syndrome X 39/76/7341  . Disorder of esophagus 01/19/2015  . Edema 01/19/2015  . Esophagitis, reflux 01/19/2015  . Frontal sinusitis 01/19/2015  . BP (high blood pressure) 01/19/2015  . Anemia, iron deficiency 01/19/2015  . Iron deficiency anemia 01/19/2015  . Abnormal kidney function 01/19/2015  . Cervical pain 01/19/2015  . Dyspnea, paroxysmal nocturnal 01/19/2015  . Gastric ulcer 01/19/2015  . Hive 01/19/2015  . B12 deficiency 01/19/2015  . Vitamin D deficiency 01/19/2015  . Degenerative arthritis of finger 06/07/2014  . Benign paroxysmal positional nystagmus 03/09/2009  . HLD (hyperlipidemia) 11/03/2008  . Fam  hx-ischem heart disease 06/26/2008  . History of tobacco use 06/26/2008  . Essential (primary) hypertension 06/03/2008  . Extreme obesity 06/03/2008  . Apnea, sleep 06/03/2008  . Morbid obesity ([place]) 06/03/2008  . H/O total hysterectomy 04/30/1998    Past Surgical History:  Procedure Laterality Date  . ABDOMINAL HYSTERECTOMY    . APPENDECTOMY    . BACK SURGERY     x2 FOR RUPTURED DISC  . CARDIAC CATHETERIZATION  1990's  . CATARACT EXTRACTION W/PHACO Left 04/07/2019   Procedure: CATARACT EXTRACTION PHACO AND INTRAOCULAR LENS PLACEMENT (IOC) LEFT 4.43, 00:34.1;  Surgeon: Birder Robson, MD;  Location: [place];  Service: Ophthalmology;  Laterality: Left;  Sleep Apnea-does not wear CPAP Latex-adhesives  . CATARACT EXTRACTION W/PHACO Right 05/05/2019   Procedure: CATARACT EXTRACTION PHACO AND INTRAOCULAR LENS PLACEMENT (IOC) RIGHT;  4.83, 00:33.4;  Surgeon: Birder Robson, MD;  Location: [place];  Service: Ophthalmology;  Laterality: Right;  SLEP APNEA-CPAP  . CHOLECYSTECTOMY    . COLONOSCOPY    . COLONOSCOPY WITH PROPOFOL N/A 10/16/2017   Procedure: COLONOSCOPY WITH PROPOFOL;  Surgeon: Manya Silvas, MD;  Location: [facility] ENDOSCOPY;  Service: Endoscopy;  Laterality: N/A;  . ESOPHAGOGASTRODUODENOSCOPY    . ESOPHAGOGASTRODUODENOSCOPY (EGD) WITH PROPOFOL N/A 10/16/2017   Procedure: ESOPHAGOGASTRODUODENOSCOPY (EGD) WITH PROPOFOL;  Surgeon: Manya Silvas, MD;  Location: [facility] ENDOSCOPY;  Service: Endoscopy;  Laterality: N/A;  . FUNCTIONAL ENDOSCOPIC SINUS SURGERY    . knee arthoscopy    . LUMBAR DISC SURGERY     ruptured disc x 2  . TONSILLECTOMY AND ADENOIDECTOMY      Her family history includes Breast cancer (age of onset: 44) in her mother; Heart attack in her brother and father.   Current Outpatient Medications:  .  albuterol (PROAIR HFA) 108 (90 Base) MCG/ACT inhaler, Inhale into the lungs as needed. , Disp: , Rfl:  .  beclomethasone (QVAR) 80 MCG/ACT  inhaler, Inhale 2 puffs into the lungs 2 (two) times daily., Disp: , Rfl:  .  Cholecalciferol (VITAMIN D-1000 MAX ST) 1000 UNITS tablet, Take by mouth., Disp: , Rfl:  .  DEXILANT 60 MG capsule, Take 1 capsule by mouth daily. am, Disp: , Rfl: 11 .  losartan (COZAAR) 50 MG tablet, TAKE 1 TABLET BY MOUTH EVERY DAY, Disp: 90 tablet, Rfl: 0 .  Magnesium 200 MG TABS, Take by mouth., Disp: , Rfl:  .  montelukast (SINGULAIR) 10 MG tablet, TAKE 1 TABLET (10 MG TOTAL) BY MOUTH DAILY. (Patient taking differently: Take 10 mg by mouth daily. am), Disp: 30 tablet, Rfl: 2 .  naproxen sodium (ALEVE) 220 MG tablet, Take 220 mg by mouth daily as needed., Disp: , Rfl:  .  NON FORMULARY, CPAP at night, Disp: , Rfl:  .  verapamil (CALAN-SR) 240 MG CR tablet, TAKE 1 TABLET BY MOUTH EVERY DAY, Disp: 90 tablet, Rfl: 3 .  meclizine (ANTIVERT) 25 MG tablet, Take 1 tablet (25 mg total) by mouth 3 (three) times daily as needed for dizziness. (Patient not taking: Reported on 02/17/2019), Disp: 60 tablet, Rfl: 1   Patient Care Team: Jerrol Banana., MD as PCP - General (Family Medicine) Jerrol Banana., MD (Family Medicine)  Review of Systems  Constitutional: Negative.   HENT: Negative.   Eyes: Negative.   Respiratory: Negative.   Cardiovascular: Negative.   Gastrointestinal: Negative.   Endocrine: Negative.   Genitourinary: Negative.   Musculoskeletal: Negative.   Skin: Negative.   Allergic/Immunologic: Negative.   Neurological: Negative.   Hematological: Negative.   Psychiatric/Behavioral: Negative.        Objective:    Vitals: BP (!) 145/81 (BP Location: Right Arm, Patient Position: Sitting, Cuff Size: Large)   Pulse 75   Temp (!) 97.3 F (36.3 C) (Temporal)   Ht 5\' 5"  (1.651 m)   Wt (!) 275 lb 3.2 oz (124.8 kg)   BMI 45.80 kg/m   Hearing Screening   125Hz  250Hz  500Hz  1000Hz  2000Hz  3000Hz  4000Hz  6000Hz  8000Hz   Right ear:   Fail 40 40  Fail    Left ear:   Fail Fail Fail  Fail       Visual Acuity Screening   Right eye Left eye Both eyes  Without correction:     With correction: 20/40 20/30 20/30    Physical Exam Vitals reviewed.  Constitutional:      Appearance: She is well-developed.  HENT:     Head: Normocephalic and atraumatic.     Right Ear: External ear normal.     Left Ear: External ear normal.     Nose: Nose normal.  Eyes:     General: No scleral icterus.    Conjunctiva/sclera: Conjunctivae normal.  Neck:     Thyroid: No thyromegaly.  Cardiovascular:     Rate and Rhythm: Normal rate and regular rhythm.     Heart sounds: Normal heart sounds.  Pulmonary:     Effort: Pulmonary effort is normal.     Breath sounds: Normal breath sounds.  Abdominal:     Palpations: Abdomen is soft.  Skin:    General: Skin is warm and dry.  Neurological:     Mental Status: She is alert and oriented to person, place, and time.  Psychiatric:        Behavior: Behavior normal.        Thought Content: Thought content normal.        Judgment: Judgment normal.     General Appearance:    Severely obese female. Alert, cooperative, in no acute distress, appears stated age   Head:    Normocephalic, without obvious abnormality, atraumatic  Eyes:    PERRL, conjunctiva/corneas clear, EOM's intact, fundi    benign, both eyes  Ears:    Normal TM's and external ear canals, both ears  Nose:   Nares normal, septum midline, mucosa normal, no drainage    or sinus tenderness  Throat:   Lips, mucosa, and tongue normal; teeth and gums normal  Neck:   Supple, symmetrical, trachea midline, no adenopathy;    thyroid:  no enlargement/tenderness/nodules; no carotid   bruit or JVD  Back:     Symmetric, no curvature, ROM normal, no CVA tenderness  Lungs:  Clear to auscultation bilaterally, respirations unlabored  Chest Wall:    No tenderness or deformity   Heart:    Normal heart rate. Normal rhythm. No murmurs, rubs, or gallops.   Breast Exam:    deferred  Abdomen:     Soft, non-tender,  bowel sounds active all four quadrants,    no masses, no organomegaly  Pelvic:    deferred  Extremities:   All extremities are intact. No cyanosis or edema  Pulses:   2+ and symmetric all extremities  Skin:   Skin color, texture, turgor normal, no rashes or lesions  Lymph nodes:   Cervical, supraclavicular, and axillary nodes normal  Neurologic:   CNII-XII intact, normal strength, sensation and reflexes    throughout   Activities of Daily Living In your present state of health, do you have any difficulty performing the following activities: 11/26/2019 05/05/2019  Hearing? Y N  Vision? N N  Difficulty concentrating or making decisions? N N  Walking or climbing stairs? Y N  Dressing or bathing? N N  Doing errands, shopping? N -  Some recent data might be hidden    Fall Risk Assessment Fall Risk  11/26/2019 02/17/2019  Falls in the past year? 0 0  Number falls in past yr: 0 0  Injury with Fall? 0 0     Depression Screen PHQ 2/9 Scores 11/26/2019 02/17/2019 01/14/2018 08/23/2016  PHQ - 2 Score 0 0 0 0  PHQ- 9 Score - 0 0 1    6CIT Screen 11/26/2019  What Year? 0 points  What month? 0 points  What time? 0 points  Count back from 20 0 points  Months in reverse 0 points  Repeat phrase 0 points  Total Score 0      Assessment & Plan:    Initial Preventative Physical Exam  Reviewed patient's Family Medical History Reviewed and updated list of patient's medical providers Assessment of cognitive impairment was done Assessed patient's functional ability Established a written schedule for health screening [place] Completed and Reviewed  Exercise Activities and Dietary recommendations Goals   None     Immunization History  Administered Date(s) Administered  . Fluad Quad(high Dose 65+) 02/17/2019  . Influenza,inj,Quad PF,6+ Mos 02/16/2017, 01/14/2018  . PFIZER SARS-COV-2 Vaccination 07/02/2019, 07/23/2019  . Tdap 02/05/2007    Health Maintenance    Topic Date Due  . Hepatitis C Screening  Never done  . HIV Screening  Never done  . TETANUS/TDAP  02/04/2017  . DEXA SCAN  Never done  . PNA vac Low Risk Adult (1 of 2 - PCV13) Never done  . INFLUENZA VACCINE  11/29/2019  . PAP SMEAR-Modifier  01/15/2021  . MAMMOGRAM  11/10/2021  . COLONOSCOPY  10/17/2027  . COVID-19 Vaccine  Completed     Discussed health benefits of physical activity, and encouraged her to engage in regular exercise appropriate for her age and condition.   1. Welcome to Medicare preventive visit  - EKG 12-Lead--sinus rhythm at 75.  Possible old anterior infarct.  Very low voltage across precordial leads.  2. ASCVD (arteriosclerotic cardiovascular disease) Found on dental x-rays.  Will treat blood pressure, blood sugar and lipids aggressively.  3. Estrogen deficiency  - DG Bone density [place]; Future  4. Hives Add doxepin.  Otherwise followed by dermatology. - doxepin (SINEQUAN) 10 MG capsule; Take 1 capsule (10 mg total) by mouth in the morning, at noon, and at bedtime.  Dispense: 90 capsule; Refill: 5  5. Hyperlipidemia,  unspecified hyperlipidemia type On Crestor daily.  Follow-up 1 to 2 months. - rosuvastatin (CRESTOR) 10 MG tablet; Take 1 tablet (10 mg total) by mouth daily.  Dispense: 90 tablet; Refill: 3  6. OSA (obstructive sleep apnea)   7. B12 deficiency   8. Class 3 severe obesity due to excess calories with serious comorbidity and body mass index (BMI) of 45.0 to 49.9 in adult [facility]) Diet and exercise stressed.      Katey Barrie Cranford Mon, MD  [facility] [phone] (phone) [phone] (fax)  [place]

## 2019-11-26 ENCOUNTER — Other Ambulatory Visit: Payer: Self-pay

## 2019-11-26 ENCOUNTER — Other Ambulatory Visit: Payer: Self-pay | Admitting: Family Medicine

## 2019-11-26 ENCOUNTER — Ambulatory Visit (INDEPENDENT_AMBULATORY_CARE_PROVIDER_SITE_OTHER): Payer: PPO | Admitting: Family Medicine

## 2019-11-26 ENCOUNTER — Encounter: Payer: Self-pay | Admitting: Family Medicine

## 2019-11-26 VITALS — BP 145/81 | HR 75 | Temp 97.3°F | Ht 65.0 in | Wt 275.2 lb

## 2019-11-26 DIAGNOSIS — Z0000 Encounter for general adult medical examination without abnormal findings: Secondary | ICD-10-CM

## 2019-11-26 DIAGNOSIS — I2510 Atherosclerotic heart disease of native coronary artery without angina pectoris: Secondary | ICD-10-CM

## 2019-11-26 DIAGNOSIS — Z6841 Body Mass Index (BMI) 40.0 and over, adult: Secondary | ICD-10-CM | POA: Diagnosis not present

## 2019-11-26 DIAGNOSIS — E2839 Other primary ovarian failure: Secondary | ICD-10-CM

## 2019-11-26 DIAGNOSIS — E538 Deficiency of other specified B group vitamins: Secondary | ICD-10-CM | POA: Diagnosis not present

## 2019-11-26 DIAGNOSIS — L509 Urticaria, unspecified: Secondary | ICD-10-CM

## 2019-11-26 DIAGNOSIS — E785 Hyperlipidemia, unspecified: Secondary | ICD-10-CM

## 2019-11-26 DIAGNOSIS — G4733 Obstructive sleep apnea (adult) (pediatric): Secondary | ICD-10-CM | POA: Diagnosis not present

## 2019-11-26 MED ORDER — DOXEPIN HCL 10 MG PO CAPS: 10.0000 mg | ORAL_CAPSULE | Freq: Three times a day (TID) | ORAL | 5 refills | Status: DC

## 2019-11-26 MED ORDER — ROSUVASTATIN CALCIUM 10 MG PO TABS: 10.0000 mg | ORAL_TABLET | Freq: Every day | ORAL | 3 refills | Status: DC

## 2019-11-26 NOTE — Patient Instructions (Signed)
Change Zyrtec to Claritin 3 times daily while taking Doxepin  Start Crestor after September 1st

## 2019-12-03 DIAGNOSIS — H26492 Other secondary cataract, left eye: Secondary | ICD-10-CM | POA: Diagnosis not present

## 2019-12-06 NOTE — Progress Notes (Signed)
PCP: Jerrol Banana., MD   Chief Complaint  Patient presents with  . Annual Exam    HPI:      Ms. Sarah Marquez is a 64 y.o. No obstetric history on file. who LMP was No LMP recorded. Patient has had a hysterectomy., presents today for her annual examination.  Her menses are absent due to TAH due to leio/menorrhagia. She does not have intermenstrual bleeding. She does not have vasomotor sx.   Sex activity: not currently sex active d/t husband - post menopausal status. She does not have vaginal dryness.  Last Pap: 01/15/18 Results were: no abnormalities /neg HPV DNA Hx of STDs: none  Last mammogram: 11/11/19  Results were: normal--routine follow-up in 12 months There is a FH of breast cancer in her mom, genetic testing not indicated. Pt had genetic testing done a few yrs ago for anemia at [place] and thinks cancer testing done at that time, too. No notes available. There is no FH of ovarian cancer. The patient does do self-breast exams.  Colonoscopy: 2019, no abnormalities.  Repeat due after 5 or 10 years--pt unsure.  DEXA getting sched by PCP  Tobacco use: The patient denies current or previous tobacco use. Alcohol use: none  No drug use Exercise: min active  She does not get adequate calcium and Vitamin D in her diet.  Labs with PCP.   Past Medical History:  Diagnosis Date  . Arthritis    neck, knees  . Cancer ([place])    skin, basal cell removed from neck, back and foot  . Carpal tunnel syndrome   . Colon polyps   . Difficult intubation   . Gastric ulcer   . GERD (gastroesophageal reflux disease)    on dexilant  . Gout   . Hyperlipidemia   . Hypertension    controlled on meds  . Iron deficiency anemia   . Sleep apnea    CPAP  . Vertigo    last episode 12/2018  . Wears dentures    partial upper    Past Surgical History:  Procedure Laterality Date  . ABDOMINAL HYSTERECTOMY    . APPENDECTOMY    . BACK SURGERY     x2 FOR RUPTURED DISC  .  CARDIAC CATHETERIZATION  1990's  . CATARACT EXTRACTION W/PHACO Left 04/07/2019   Procedure: CATARACT EXTRACTION PHACO AND INTRAOCULAR LENS PLACEMENT (IOC) LEFT 4.43, 00:34.1;  Surgeon: Birder Robson, MD;  Location: [facility];  Service: Ophthalmology;  Laterality: Left;  Sleep Apnea-does not wear CPAP Latex-adhesives  . CATARACT EXTRACTION W/PHACO Right 05/05/2019   Procedure: CATARACT EXTRACTION PHACO AND INTRAOCULAR LENS PLACEMENT (IOC) RIGHT;  4.83, 00:33.4;  Surgeon: Birder Robson, MD;  Location: [place];  Service: Ophthalmology;  Laterality: Right;  SLEP APNEA-CPAP  . CHOLECYSTECTOMY    . COLONOSCOPY    . COLONOSCOPY WITH PROPOFOL N/A 10/16/2017   Procedure: COLONOSCOPY WITH PROPOFOL;  Surgeon: Manya Silvas, MD;  Location[facility] ENDOSCOPY;  Service: Endoscopy;  Laterality: N/A;  . ESOPHAGOGASTRODUODENOSCOPY    . ESOPHAGOGASTRODUODENOSCOPY (EGD) WITH PROPOFOL N/A 10/16/2017   Procedure: ESOPHAGOGASTRODUODENOSCOPY (EGD) WITH PROPOFOL;  Surgeon: Manya Silvas, MD;  Location: [facility] ENDOSCOPY;  Service: Endoscopy;  Laterality: N/A;  . FUNCTIONAL ENDOSCOPIC SINUS SURGERY    . knee arthoscopy    . LUMBAR DISC SURGERY     ruptured disc x 2  . TONSILLECTOMY AND ADENOIDECTOMY      Family History  Problem Relation Age of Onset  .  Breast cancer Mother 70  . Heart attack Father   . Heart attack Brother     Social History   Socioeconomic History  . Marital status: Married    Spouse name: Not on file  . Number of children: Not on file  . Years of education: Not on file  . Highest education level: Not on file  Occupational History  . Not on file  Tobacco Use  . Smoking status: Never Smoker  . Smokeless tobacco: Never Used  . Tobacco comment: former social smoker  Vaping Use  . Vaping Use: Never used  Substance and Sexual Activity  . Alcohol use: No    Alcohol/week: 0.0 standard drinks  . Drug use: No  . Sexual activity: Yes    Birth  control/protection: Post-menopausal  Other Topics Concern  . Not on file  Social History Narrative  . Not on file   Social Determinants of Health   Financial Resource Strain:   . Difficulty of Paying Living Expenses:   Food Insecurity:   . Worried About Charity fundraiser in the Last Year:   . Arboriculturist in the Last Year:   Transportation Needs:   . Film/video editor (Medical):   Marland Kitchen Lack of Transportation (Non-Medical):   Physical Activity:   . Days of Exercise per Week:   . Minutes of Exercise per Session:   Stress:   . Feeling of Stress :   Social Connections:   . Frequency of Communication with Friends and Family:   . Frequency of Social Gatherings with Friends and Family:   . Attends Religious Services:   . Active Member of Clubs or Organizations:   . Attends Archivist Meetings:   Marland Kitchen Marital Status:   Intimate Partner Violence:   . Fear of Current or Ex-Partner:   . Emotionally Abused:   Marland Kitchen Physically Abused:   . Sexually Abused:     Outpatient Medications Prior to Visit  Medication Sig Dispense Refill  . albuterol (PROAIR HFA) 108 (90 Base) MCG/ACT inhaler Inhale into the lungs as needed.     . beclomethasone (QVAR) 80 MCG/ACT inhaler Inhale 2 puffs into the lungs 2 (two) times daily.    . Cholecalciferol (VITAMIN D-1000 MAX ST) 1000 UNITS tablet Take by mouth.    . DEXILANT 60 MG capsule Take 1 capsule by mouth daily. am  11  . doxepin (SINEQUAN) 10 MG capsule Take 1 capsule (10 mg total) by mouth in the morning, at noon, and at bedtime. 90 capsule 5  . losartan (COZAAR) 50 MG tablet TAKE 1 TABLET BY MOUTH EVERY DAY 90 tablet 0  . Magnesium 200 MG TABS Take by mouth.    . meclizine (ANTIVERT) 25 MG tablet Take 1 tablet (25 mg total) by mouth 3 (three) times daily as needed for dizziness. (Patient not taking: Reported on 02/17/2019) 60 tablet 1  . montelukast (SINGULAIR) 10 MG tablet TAKE 1 TABLET (10 MG TOTAL) BY MOUTH DAILY. (Patient taking  differently: Take 10 mg by mouth daily. am) 30 tablet 2  . naproxen sodium (ALEVE) 220 MG tablet Take 220 mg by mouth daily as needed.    . NON FORMULARY CPAP at night    . rosuvastatin (CRESTOR) 10 MG tablet Take 1 tablet (10 mg total) by mouth daily. 90 tablet 3  . verapamil (CALAN-SR) 240 MG CR tablet TAKE 1 TABLET BY MOUTH EVERY DAY 90 tablet 3   No facility-administered medications prior to visit.  ROS:  Review of Systems  Constitutional: Negative for fatigue, fever and unexpected weight change.  Respiratory: Negative for cough, shortness of breath and wheezing.   Cardiovascular: Negative for chest pain, palpitations and leg swelling.  Gastrointestinal: Negative for blood in stool, constipation, diarrhea, nausea and vomiting.  Endocrine: Negative for cold intolerance, heat intolerance and polyuria.  Genitourinary: Negative for dyspareunia, dysuria, flank pain, frequency, genital sores, hematuria, menstrual problem, pelvic pain, urgency, vaginal bleeding, vaginal discharge and vaginal pain.  Musculoskeletal: Positive for arthralgias. Negative for back pain, joint swelling and myalgias.  Skin: Negative for rash.  Neurological: Negative for dizziness, syncope, light-headedness, numbness and headaches.  Hematological: Negative for adenopathy.  Psychiatric/Behavioral: Negative for agitation, confusion, sleep disturbance and suicidal ideas. The patient is not nervous/anxious.   BREAST: No symptoms    Objective: BP 126/74   Ht 5\' 5"  (1.651 m)   Wt 272 lb (123.4 kg)   BMI 45.26 kg/m    Physical Exam Constitutional:      Appearance: She is well-developed.  Genitourinary:     Vulva, vagina, right adnexa and left adnexa normal.     No vaginal discharge, erythema or tenderness.     Cervix is absent.     Uterus is not tender.     Uterus is absent.     No right or left adnexal mass present.     Right adnexa not tender.     Left adnexa not tender.     Genitourinary Comments:  UTERUS/CX SURG REM  Neck:     Thyroid: No thyromegaly.  Cardiovascular:     Rate and Rhythm: Normal rate and regular rhythm.     Heart sounds: Normal heart sounds. No murmur heard.   Pulmonary:     Effort: Pulmonary effort is normal.     Breath sounds: Normal breath sounds.  Chest:     Breasts:        Right: No mass, nipple discharge, skin change or tenderness.        Left: No mass, nipple discharge, skin change or tenderness.  Abdominal:     Palpations: Abdomen is soft.     Tenderness: There is no abdominal tenderness. There is no guarding.  Musculoskeletal:        General: Normal range of motion.     Cervical back: Normal range of motion.  Neurological:     General: No focal deficit present.     Mental Status: She is alert and oriented to person, place, and time.     Cranial Nerves: No cranial nerve deficit.  Skin:    General: Skin is warm and dry.  Psychiatric:        Mood and Affect: Mood normal.        Behavior: Behavior normal.        Thought Content: Thought content normal.        Judgment: Judgment normal.  Vitals reviewed.     Assessment/Plan:  Encounter for annual routine gynecological examination  Encounter for screening mammogram for malignant neoplasm of breast; pt current on mammo         GYN counsel adequate intake of calcium and vitamin D, diet and exercise    F/U  Return in about 2 years (around 12/06/2021).  Latron Ribas B. Gage Weant, PA-C 12/07/2019 10:25 AM

## 2019-12-07 ENCOUNTER — Other Ambulatory Visit: Payer: Self-pay

## 2019-12-07 ENCOUNTER — Ambulatory Visit (INDEPENDENT_AMBULATORY_CARE_PROVIDER_SITE_OTHER): Payer: PPO | Admitting: Obstetrics and Gynecology

## 2019-12-07 ENCOUNTER — Encounter: Payer: Self-pay | Admitting: Obstetrics and Gynecology

## 2019-12-07 VITALS — BP 126/74 | Ht 65.0 in | Wt 272.0 lb

## 2019-12-07 DIAGNOSIS — Z1231 Encounter for screening mammogram for malignant neoplasm of breast: Secondary | ICD-10-CM | POA: Diagnosis not present

## 2019-12-07 DIAGNOSIS — Z01419 Encounter for gynecological examination (general) (routine) without abnormal findings: Secondary | ICD-10-CM

## 2019-12-07 NOTE — Patient Instructions (Signed)
I value your feedback and entrusting us with your care. If you get a [facility] patient survey, I would appreciate you taking the time to let us know about your experience today. Thank you!  As of April 09, 2019, your lab results will be released to your MyChart immediately, before I even have a chance to see them. Please give me time to review them and contact you if there are any abnormalities. Thank you for your patience.  

## 2019-12-24 DIAGNOSIS — M1711 Unilateral primary osteoarthritis, right knee: Secondary | ICD-10-CM | POA: Diagnosis not present

## 2020-01-26 DIAGNOSIS — R05 Cough: Secondary | ICD-10-CM | POA: Diagnosis not present

## 2020-01-26 DIAGNOSIS — Z20822 Contact with and (suspected) exposure to covid-19: Secondary | ICD-10-CM | POA: Diagnosis not present

## 2020-01-26 DIAGNOSIS — Z03818 Encounter for observation for suspected exposure to other biological agents ruled out: Secondary | ICD-10-CM | POA: Diagnosis not present

## 2020-01-28 ENCOUNTER — Other Ambulatory Visit: Payer: Self-pay

## 2020-01-28 ENCOUNTER — Ambulatory Visit
Admission: RE | Admit: 2020-01-28 | Discharge: 2020-01-28 | Disposition: A | Discharge status: Home / Self Care | Payer: PPO | Attending: Physician Assistant | Admitting: Physician Assistant

## 2020-01-28 ENCOUNTER — Other Ambulatory Visit: Payer: Self-pay | Admitting: Physician Assistant

## 2020-01-28 ENCOUNTER — Ambulatory Visit
Admission: RE | Admit: 2020-01-28 | Discharge: 2020-01-28 | Disposition: A | Discharge status: Home / Self Care | Payer: PPO | Source: Ambulatory Visit | Attending: Physician Assistant | Admitting: Physician Assistant

## 2020-01-28 DIAGNOSIS — R059 Cough, unspecified: Secondary | ICD-10-CM

## 2020-01-28 DIAGNOSIS — K219 Gastro-esophageal reflux disease without esophagitis: Secondary | ICD-10-CM | POA: Diagnosis not present

## 2020-01-28 DIAGNOSIS — J329 Chronic sinusitis, unspecified: Secondary | ICD-10-CM | POA: Diagnosis not present

## 2020-01-28 DIAGNOSIS — J309 Allergic rhinitis, unspecified: Secondary | ICD-10-CM | POA: Diagnosis not present

## 2020-01-28 DIAGNOSIS — R05 Cough: Secondary | ICD-10-CM | POA: Diagnosis not present

## 2020-02-01 ENCOUNTER — Telehealth: Payer: Self-pay | Admitting: Family Medicine

## 2020-02-01 NOTE — Telephone Encounter (Signed)
Pt wanting to check on the status of her bone density test to be scheduled. She has not been contacted to schedule appt.  Please call pt back to let her know.  Thanks, [employer]

## 2020-02-03 ENCOUNTER — Other Ambulatory Visit: Payer: Self-pay | Admitting: *Deleted

## 2020-02-03 DIAGNOSIS — Z0000 Encounter for general adult medical examination without abnormal findings: Secondary | ICD-10-CM

## 2020-02-03 DIAGNOSIS — E2839 Other primary ovarian failure: Secondary | ICD-10-CM

## 2020-02-03 NOTE — Telephone Encounter (Signed)
Order placed

## 2020-02-05 ENCOUNTER — Other Ambulatory Visit: Payer: Self-pay | Admitting: Family Medicine

## 2020-02-05 DIAGNOSIS — L509 Urticaria, unspecified: Secondary | ICD-10-CM

## 2020-02-05 NOTE — Telephone Encounter (Signed)
Requested medication (s) are due for refill today: No  Requested medication (s) are on the active medication list: Yes  Last refill:  11/26/19 # 90 with 5 refills  Future visit scheduled: Yes  Notes to clinic:  Pharmacy asking for 90 day supply and diagnosis code.    Requested Prescriptions  Pending Prescriptions Disp Refills   doxepin (SINEQUAN) 10 MG capsule [Pharmacy Med Name: DOXEPIN 10 MG CAPSULE] 270 capsule 2    Sig: Take 1 capsule (10 mg total) by mouth in the morning, at noon, and at bedtime.      Psychiatry:  Antidepressants - Heterocyclics (TCAs) Passed - 02/05/2020  1:31 PM      Passed - Valid encounter within last 6 months    Recent Outpatient Visits           2 months ago Welcome to [facility] preventive visit   [facility] Jerrol Banana., MD   5 months ago Essential hypertension   [facility] Jerrol Banana., MD   11 months ago Essential (primary) hypertension   [facility] Jerrol Banana., MD   1 year ago Dizziness   [facility] Jerrol Banana., MD   1 year ago Frontal sinusitis, unspecified chronicity   [facility] Jerrol Banana., MD       Future Appointments             In 1 month Jerrol Banana., MD [facility], [place]

## 2020-02-11 NOTE — Progress Notes (Signed)
Established patient visit   Patient: Sarah Marquez   DOB: July 09, 1955   64 y.o. Female  MRN: [id number] Visit Date: 02/12/2020  Today's healthcare provider: Marcille Buffy, FNP   Chief Complaint  Patient presents with  . Hip Pain   Subjective    Hip Pain  The incident occurred 3 to 5 days ago (Wednesday). There was no injury mechanism. The pain is present in the left hip. The quality of the pain is described as aching. The pain is at a severity of 8/10. The pain has been constant since onset. She reports no foreign bodies present. The symptoms are aggravated by movement. She has tried ice for the symptoms. The treatment provided moderate relief.    She does report having this a few years ago and did x ray nothing significant found and resolved. Does feel some pulling in left side of hip lower back when changing positions such as getting outr of the bed or up and down from chair. Described as aching pain.  Aleve is not helping. She felt she had a few twinges in left lower abdomen x 2 the past two days.  Had hysterectomy does have left ovary.  She is having some urinary hesitancy at times, denies any dysuria or hematuria.   Bowel movements reports is within normal limits. No bleeding.   .She has tried icing the areas.   Denies any loss of bowel or bladder control.  Denies saddle paresthesias.  Denies radiculopathy/ paresthesias.  Patient  denies any fever,chills, rash, chest pain, shortness of breath, nausea, vomiting, or diarrhea.     Patient Active Problem List   Diagnosis Date Noted  . Cough variant asthma 12/27/2015  . Dyspnea 11/14/2015  . OSA (obstructive sleep apnea) 11/10/2015  . Recurrent URI (upper respiratory infection) 11/10/2015  . Cough 11/10/2015  . Benign neoplasm of colon 01/19/2015  . Allergic rhinitis 01/19/2015  . Absolute anemia 01/19/2015  . Colon polyp 01/19/2015  . Ankle bruise 01/19/2015  . Dysmetabolic syndrome X 56/97/9480  .  Disorder of esophagus 01/19/2015  . Edema 01/19/2015  . Esophagitis, reflux 01/19/2015  . Frontal sinusitis 01/19/2015  . BP (high blood pressure) 01/19/2015  . Anemia, iron deficiency 01/19/2015  . Iron deficiency anemia 01/19/2015  . Abnormal kidney function 01/19/2015  . Cervical pain 01/19/2015  . Dyspnea, paroxysmal nocturnal 01/19/2015  . Gastric ulcer 01/19/2015  . Hive 01/19/2015  . B12 deficiency 01/19/2015  . Vitamin D deficiency 01/19/2015  . Degenerative arthritis of finger 06/07/2014  . Benign paroxysmal positional nystagmus 03/09/2009  . HLD (hyperlipidemia) 11/03/2008  . Fam hx-ischem heart disease 06/26/2008  . History of tobacco use 06/26/2008  . Essential (primary) hypertension 06/03/2008  . Extreme obesity 06/03/2008  . Apnea, sleep 06/03/2008  . Morbid obesity ([place]) 06/03/2008  . H/O total hysterectomy 04/30/1998       Medications: Outpatient Medications Prior to Visit  Medication Sig  . albuterol (PROAIR HFA) 108 (90 Base) MCG/ACT inhaler Inhale into the lungs as needed.   . beclomethasone (QVAR) 80 MCG/ACT inhaler Inhale 2 puffs into the lungs 2 (two) times daily.  . Cholecalciferol (VITAMIN D-1000 MAX ST) 1000 UNITS tablet Take by mouth.  . DEXILANT 60 MG capsule Take 1 capsule by mouth daily. am  . doxepin (SINEQUAN) 10 MG capsule TAKE 1 CAPSULE (10 MG TOTAL) BY MOUTH IN THE MORNING, AT NOON, AND AT BEDTIME.  Marland Kitchen losartan (COZAAR) 50 MG tablet TAKE 1 TABLET BY MOUTH EVERY DAY  .  Magnesium 200 MG TABS Take by mouth.  . montelukast (SINGULAIR) 10 MG tablet TAKE 1 TABLET (10 MG TOTAL) BY MOUTH DAILY. (Patient taking differently: Take 10 mg by mouth daily. am)  . naproxen sodium (ALEVE) 220 MG tablet Take 220 mg by mouth daily as needed.  . NON FORMULARY CPAP at night  . rosuvastatin (CRESTOR) 10 MG tablet Take 1 tablet (10 mg total) by mouth daily.  . verapamil (CALAN-SR) 240 MG CR tablet TAKE 1 TABLET BY MOUTH EVERY DAY  . meclizine (ANTIVERT) 25 MG  tablet Take 1 tablet (25 mg total) by mouth 3 (three) times daily as needed for dizziness. (Patient not taking: Reported on 02/17/2019)   No facility-administered medications prior to visit.    Review of Systems  Constitutional: Negative.   HENT: Negative.   Respiratory: Negative.   Cardiovascular: Negative.   Genitourinary: Positive for flank pain. Negative for difficulty urinating, dyspareunia, dysuria and enuresis.       Occasional urinary hesitancy.   Musculoskeletal: Positive for arthralgias.  Skin: Negative.  Negative for rash.  Neurological: Negative.   Hematological: Negative.   Psychiatric/Behavioral: Negative.     Last CBC Lab Results  Component Value Date   WBC 6.4 08/18/2019   HGB 13.7 08/18/2019   HCT 41.8 08/18/2019   MCV 88 08/18/2019   MCH 28.8 08/18/2019   RDW 13.9 08/18/2019   PLT 186 09/60/4540   Last metabolic panel Lab Results  Component Value Date   GLUCOSE 79 08/18/2019   NA 140 08/18/2019   K 4.5 08/18/2019   CL 102 08/18/2019   CO2 23 08/18/2019   BUN 16 08/18/2019   CREATININE 0.91 08/18/2019   GFRNONAA 66 08/18/2019   GFRAA 77 08/18/2019   CALCIUM 9.5 08/18/2019   PROT 6.8 08/18/2019   ALBUMIN 3.7 (L) 08/18/2019   LABGLOB 3.1 08/18/2019   AGRATIO 1.2 08/18/2019   BILITOT 0.7 08/18/2019   ALKPHOS 114 08/18/2019   AST 26 08/18/2019   ALT 11 08/18/2019   Last lipids Lab Results  Component Value Date   CHOL 211 (H) 08/18/2019   HDL 41 08/18/2019   LDLCALC 147 (H) 08/18/2019   TRIG 128 08/18/2019   CHOLHDL 5.1 (H) 08/18/2019   Last hemoglobin A1c No results found for: HGBA1C Last thyroid functions Lab Results  Component Value Date   TSH 1.700 08/18/2019      Objective    BP (!) 148/74 (BP Location: Left Arm, Patient Position: Sitting, Cuff Size: Large)   Pulse 75   Temp 97.9 F (36.6 C) (Oral)   Ht 5\' 5"  (1.651 m)   Wt 284 lb 6.4 oz (129 kg)   SpO2 98%   BMI 47.33 kg/m  BP Readings from Last 3 Encounters:  02/12/20  (!) 148/74  12/07/19 126/74  11/26/19 (!) 145/81      Physical Exam Vitals reviewed.  Constitutional:      General: She is not in acute distress.    Appearance: She is obese. She is not ill-appearing, toxic-appearing or diaphoretic.  HENT:     Head: Normocephalic and atraumatic.     Right Ear: There is no impacted cerumen.     Left Ear: There is no impacted cerumen.     Nose: Nose normal.     Mouth/Throat:     Mouth: Mucous membranes are moist.     Pharynx: No oropharyngeal exudate or posterior oropharyngeal erythema.  Eyes:     Conjunctiva/sclera: Conjunctivae normal.     Pupils: Pupils  are equal, round, and reactive to light.  Cardiovascular:     Rate and Rhythm: Normal rate and regular rhythm.     Pulses: Normal pulses.     Heart sounds: No murmur heard.  No friction rub. No gallop.   Pulmonary:     Effort: Pulmonary effort is normal.     Breath sounds: Normal breath sounds.  Abdominal:     General: Bowel sounds are normal. There is no distension or abdominal bruit.     Palpations: Abdomen is soft. There is no mass or pulsatile mass.     Tenderness: There is no abdominal tenderness. There is no right CVA tenderness, left CVA tenderness, guarding or rebound. Negative signs include Murphy's sign, Rovsing's sign, McBurney's sign, psoas sign and obturator sign.     Hernia: No hernia is present.  Musculoskeletal:        General: No deformity or signs of injury.     Cervical back: Normal, normal range of motion and neck supple.     Thoracic back: Normal.     Lumbar back: Spasms (#1) and tenderness (#1) present. No swelling, edema, deformity or signs of trauma. Normal range of motion. Negative right straight leg raise test and negative left straight leg raise test. No scoliosis.       Back:     Right lower leg: Normal. No edema.     Left lower leg: No edema.     Comments: Previous lumbar surgery scar.   Lymphadenopathy:     Cervical: No cervical adenopathy.  Skin:     General: Skin is warm.     Capillary Refill: Capillary refill takes less than 2 seconds.     Coloration: Skin is not jaundiced.     Findings: No rash (no rash or zoster ).  Neurological:     General: No focal deficit present.     Mental Status: She is alert and oriented to person, place, and time.     Cranial Nerves: Cranial nerves are intact.     Sensory: Sensation is intact.     Motor: No weakness.     Coordination: Coordination is intact. Coordination normal.     Gait: Gait normal.     Deep Tendon Reflexes: Reflexes normal.  Psychiatric:        Mood and Affect: Mood normal.        Thought Content: Thought content normal.        Judgment: Judgment normal.      Results for orders placed or performed in visit on 02/12/20  POCT urinalysis dipstick  Result Value Ref Range   Color, UA yellow    Clarity, UA clear    Glucose, UA Negative Negative   Bilirubin, UA negative    Ketones, UA negative    Spec Grav, UA 1.025 1.010 - 1.025   Blood, UA negative    pH, UA 6.0 5.0 - 8.0   Protein, UA Negative Negative   Urobilinogen, UA 0.2 0.2 or 1.0 E.U./dL   Nitrite, UA negative    Leukocytes, UA Negative Negative   Appearance     Odor      Assessment & Plan     Spasm of lumbar paraspinous muscle  Urinary hesitancy - Plan: POCT urinalysis dipstick, CBC With Differential, Comprehensive Metabolic Panel (CMET), DG Abd 1 View, DG Hip Unilat W OR W/O Pelvis 2-3 Views Left, Urine Culture  Left hip pain - Plan: DG Abd 1 View, DG Hip Unilat W OR W/O Pelvis 2-3  Views Left  DG abdomen to rule out large kidney stone, no one pain or abnormality on hip exam, does have tight spasm noted on left paraspinal muscle will treat as muscle spasm if KUB and hip x ray ok, and gave strict return to the office seek care immediately and  follow up precautions.   Will send in muscle relaxer for PRN bedtime, stop aleve and try Prednisone dose pack taper. She can return to NSAID PRN after completing dose pack.   Meds ordered this encounter  Medications  . predniSONE (STERAPRED UNI-PAK 21 TAB) 10 MG (21) TBPK tablet    Sig: PO: Take 6 tablets on day 1:Take 5 tablets day 2:Take 4 tablets day 3: Take 3 tablets day 4:Take 2 tablets day five: 5 Take 1 tablet day 6    Dispense:  21 tablet    Refill:  0  . cyclobenzaprine (FLEXERIL) 10 MG tablet    Sig: Take 1 tablet (10 mg total) by mouth at bedtime as needed for muscle spasms.    Dispense:  15 tablet    Refill:  0    Return in about 1 week (around 02/19/2020), or if symptoms worsen or fail to improve, for at any time for any worsening symptoms, Go to Emergency room/ urgent care if worse.        Marcille Buffy, [place] [phone] (phone) [phone] (fax)  [place]

## 2020-02-12 ENCOUNTER — Other Ambulatory Visit: Payer: Self-pay

## 2020-02-12 ENCOUNTER — Ambulatory Visit
Admission: RE | Admit: 2020-02-12 | Discharge: 2020-02-12 | Disposition: A | Discharge status: Home / Self Care | Payer: PPO | Source: Ambulatory Visit | Attending: Adult Health | Admitting: Adult Health

## 2020-02-12 ENCOUNTER — Ambulatory Visit (INDEPENDENT_AMBULATORY_CARE_PROVIDER_SITE_OTHER): Payer: PPO | Admitting: Adult Health

## 2020-02-12 ENCOUNTER — Ambulatory Visit
Admission: RE | Admit: 2020-02-12 | Discharge: 2020-02-12 | Disposition: A | Discharge status: Home / Self Care | Payer: PPO | Attending: Adult Health | Admitting: Adult Health

## 2020-02-12 ENCOUNTER — Encounter: Payer: Self-pay | Admitting: Adult Health

## 2020-02-12 VITALS — BP 148/74 | HR 75 | Temp 97.9°F | Ht 65.0 in | Wt 284.4 lb

## 2020-02-12 DIAGNOSIS — R3911 Hesitancy of micturition: Secondary | ICD-10-CM

## 2020-02-12 DIAGNOSIS — M25552 Pain in left hip: Secondary | ICD-10-CM | POA: Diagnosis not present

## 2020-02-12 DIAGNOSIS — M549 Dorsalgia, unspecified: Secondary | ICD-10-CM | POA: Diagnosis not present

## 2020-02-12 DIAGNOSIS — M62830 Muscle spasm of back: Secondary | ICD-10-CM | POA: Diagnosis not present

## 2020-02-12 DIAGNOSIS — R109 Unspecified abdominal pain: Secondary | ICD-10-CM | POA: Diagnosis not present

## 2020-02-12 LAB — POCT URINALYSIS DIPSTICK
Bilirubin, UA: NEGATIVE
Blood, UA: NEGATIVE
Glucose, UA: NEGATIVE
Ketones, UA: NEGATIVE
Leukocytes, UA: NEGATIVE
Nitrite, UA: NEGATIVE
Protein, UA: NEGATIVE
Spec Grav, UA: 1.025 (ref 1.010–1.025)
Urobilinogen, UA: 0.2 E.U./dL
pH, UA: 6 (ref 5.0–8.0)

## 2020-02-12 MED ORDER — CYCLOBENZAPRINE HCL 10 MG PO TABS: 10.0000 mg | ORAL_TABLET | Freq: Every evening | ORAL | 0 refills | Status: DC | PRN

## 2020-02-12 MED ORDER — PREDNISONE 10 MG (21) PO TBPK: ORAL_TABLET | ORAL | 0 refills | Status: DC

## 2020-02-12 NOTE — Patient Instructions (Signed)
Hip Bursitis  Hip bursitis is swelling of a fluid-filled sac (bursa) in your hip joint. This swelling (inflammation) can be painful. This condition may come and go over time. What are the causes?  Injury to the hip.  Overuse of the muscles that surround the hip joint.  An earlier injury or surgery of the hip.  Arthritis or gout.  Diabetes.  Thyroid disease.  Infection.  In some cases, the cause may not be known. What are the signs or symptoms?  Mild or moderate pain in the hip area. Pain may get worse with movement.  Tenderness and swelling of the hip, especially on the outer side of the hip.  In rare cases, the bursa may become infected. This may cause: ? A fever. ? Warmth and redness in the area. Symptoms may come and go. How is this treated? This condition is treated by resting, icing, applying pressure (compression), and raising (elevating) the injured area. You may hear this called the RICE treatment. Treatment may also include:  Using crutches.  Draining fluid out of the bursa to help relieve swelling.  Giving a shot of (injecting) medicine that helps to reduce swelling (cortisone).  Other medicines if the bursa is infected. Follow these instructions at home: Managing pain, stiffness, and swelling   If told, put ice on the painful area. ? Put ice in a plastic bag. ? Place a towel between your skin and the bag. ? Leave the ice on for 20 minutes, 2-3 times a day. ? Raise (elevate) your hip above the level of your heart as much as you can without pain. To do this, try putting a pillow under your hips while you lie down. Stop if this causes pain. Activity  Return to your normal activities as told by your doctor. Ask your doctor what activities are safe for you.  Rest and protect your hip as much as you can until you feel better. General instructions  Take over-the-counter and prescription medicines only as told by your doctor.  Wear wraps that put pressure  on your hip (compression wraps) only as told by your doctor.  Do not use your hip to support your body weight until your doctor says that you can.  Use crutches as told by your doctor.  Gently rub and stretch your injured area as often as is comfortable.  Keep all follow-up visits as told by your doctor. This is important. How is this prevented?  Exercise regularly, as told by your doctor.  Warm up and stretch before being active.  Cool down and stretch after being active.  Avoid activities that bother your hip or cause pain.  Avoid sitting down for long periods at a time. Contact a doctor if:  You have a fever.  You get new symptoms.  You have trouble walking.  You have trouble doing everyday activities.  You have pain that gets worse.  You have pain that does not get better with medicine.  You get red skin on your hip area.  You get a feeling of warmth in your hip area. Get help right away if:  You cannot move your hip.  You have very bad pain. Summary  Hip bursitis is swelling of a fluid-filled sac (bursa) in your hip.  Hip bursitis can be painful.  Symptoms often come and go over time.  This condition is treated with rest, ice, compression, elevation, and medicines. This information is not intended to replace advice given to you by your health care provider.   Make sure you discuss any questions you have with your health care provider. Document Revised: 12/23/2017 Document Reviewed: 12/23/2017 Elsevier Patient Education  [place]. Hip Pain The hip is the joint between the upper legs and the lower pelvis. The bones, cartilage, tendons, and muscles of your hip joint support your body and allow you to move around. Hip pain can range from a minor ache to severe pain in one or both of your hips. The pain may be felt on the inside of the hip joint near the groin, or on the outside near the buttocks and upper thigh. You may also have swelling or stiffness  in your hip area. Follow these instructions at home: Managing pain, stiffness, and swelling      If directed, put ice on the painful area. To do this: ? Put ice in a plastic bag. ? Place a towel between your skin and the bag. ? Leave the ice on for 20 minutes, 2-3 times a day.  If directed, apply heat to the affected area as often as told by your health care provider. Use the heat source that your health care provider recommends, such as a moist heat pack or a heating pad. ? Place a towel between your skin and the heat source. ? Leave the heat on for 20-30 minutes. ? Remove the heat if your skin turns bright red. This is especially important if you are unable to feel pain, heat, or cold. You may have a greater risk of getting burned. Activity  Do exercises as told by your health care provider.  Avoid activities that cause pain. General instructions   Take over-the-counter and prescription medicines only as told by your health care provider.  Keep a journal of your symptoms. Write down: ? How often you have hip pain. ? The location of your pain. ? What the pain feels like. ? What makes the pain worse.  Sleep with a pillow between your legs on your most comfortable side.  Keep all follow-up visits as told by your health care provider. This is important. Contact a health care provider if:  You cannot put weight on your leg.  Your pain or swelling continues or gets worse after one week.  It gets harder to walk.  You have a fever. Get help right away if:  You fall.  You have a sudden increase in pain and swelling in your hip.  Your hip is red or swollen or very tender to touch. Summary  Hip pain can range from a minor ache to severe pain in one or both of your hips.  The pain may be felt on the inside of the hip joint near the groin, or on the outside near the buttocks and upper thigh.  Avoid activities that cause pain.  Write down how often you have hip pain,  the location of the pain, what makes it worse, and what it feels like. This information is not intended to replace advice given to you by your health care provider. Make sure you discuss any questions you have with your health care provider. Document Revised: 09/01/2018 Document Reviewed: 09/01/2018 Elsevier Patient Education  [place].

## 2020-02-12 NOTE — Progress Notes (Addendum)
Hold Aleve. Will send in Prednisone taper pack to take in the mornings for inflammation and muscle relaxer for bed time as needed to see if symptoms relieve. Can start aleve back after stopping prednisone.   Mild osteoarthritis in hip seen on x ray  No significant constipation or kidney stone seen on x ray left side, and right side consistent with x ray 01/2018. There is noted a small area of shadowing in the left upper quadrant on x ray may is thought to be in the bowel, could be stool or fat.   Increase liquids and fiber. Monitor for any dark colored stools or blood.  If symptoms persist recommend CT abdomen with contrast. Abdominal exam was normal in office.   If symptoms are not relieved return to the office, schedule follow up in two weeks and sooner if needed. Seek care immediately if symptoms worsen at anytime.   Meds ordered this encounter  Medications  . predniSONE (STERAPRED UNI-PAK 21 TAB) 10 MG (21) TBPK tablet    Sig: PO: Take 6 tablets on day 1:Take 5 tablets day 2:Take 4 tablets day 3: Take 3 tablets day 4:Take 2 tablets day five: 5 Take 1 tablet day 6    Dispense:  21 tablet    Refill:  0  . cyclobenzaprine (FLEXERIL) 10 MG tablet    Sig: Take 1 tablet (10 mg total) by mouth at bedtime as needed for muscle spasms.    Dispense:  15 tablet    Refill:  0

## 2020-02-13 LAB — CBC WITH DIFFERENTIAL
Basophils Absolute: 0 10*3/uL (ref 0.0–0.2)
Basos: 1 %
EOS (ABSOLUTE): 0.5 10*3/uL — ABNORMAL HIGH (ref 0.0–0.4)
Eos: 8 %
Hematocrit: 39.3 % (ref 34.0–46.6)
Hemoglobin: 13.1 g/dL (ref 11.1–15.9)
Immature Grans (Abs): 0 10*3/uL (ref 0.0–0.1)
Immature Granulocytes: 0 %
Lymphocytes Absolute: 1.6 10*3/uL (ref 0.7–3.1)
Lymphs: 24 %
MCH: 29.2 pg (ref 26.6–33.0)
MCHC: 33.3 g/dL (ref 31.5–35.7)
MCV: 88 fL (ref 79–97)
Monocytes Absolute: 0.4 10*3/uL (ref 0.1–0.9)
Monocytes: 7 %
Neutrophils Absolute: 3.9 10*3/uL (ref 1.4–7.0)
Neutrophils: 60 %
RBC: 4.48 x10E6/uL (ref 3.77–5.28)
RDW: 13.8 % (ref 11.7–15.4)
WBC: 6.4 10*3/uL (ref 3.4–10.8)

## 2020-02-13 LAB — COMPREHENSIVE METABOLIC PANEL
ALT: 14 IU/L (ref 0–32)
AST: 26 IU/L (ref 0–40)
Albumin/Globulin Ratio: 1.2 (ref 1.2–2.2)
Albumin: 3.6 g/dL — ABNORMAL LOW (ref 3.8–4.8)
Alkaline Phosphatase: 117 IU/L (ref 44–121)
BUN/Creatinine Ratio: 20 (ref 12–28)
BUN: 18 mg/dL (ref 8–27)
Bilirubin Total: 0.6 mg/dL (ref 0.0–1.2)
CO2: 25 mmol/L (ref 20–29)
Calcium: 9.2 mg/dL (ref 8.7–10.3)
Chloride: 104 mmol/L (ref 96–106)
Creatinine, Ser: 0.89 mg/dL (ref 0.57–1.00)
GFR calc Af Amer: 78 mL/min/{1.73_m2} (ref >59)
GFR calc non Af Amer: 68 mL/min/{1.73_m2} (ref >59)
Globulin, Total: 2.9 g/dL (ref 1.5–4.5)
Glucose: 85 mg/dL (ref 65–99)
Potassium: 4.9 mmol/L (ref 3.5–5.2)
Sodium: 140 mmol/L (ref 134–144)
Total Protein: 6.5 g/dL (ref 6.0–8.5)

## 2020-02-15 NOTE — Progress Notes (Signed)
Labs look good and reassuring, the urine culture is still pending.

## 2020-02-16 LAB — SPECIMEN STATUS REPORT

## 2020-02-16 LAB — AMYLASE: Amylase: 67 U/L (ref 31–110)

## 2020-02-18 ENCOUNTER — Other Ambulatory Visit: Payer: Self-pay | Admitting: Adult Health

## 2020-02-18 DIAGNOSIS — N390 Urinary tract infection, site not specified: Secondary | ICD-10-CM

## 2020-02-18 LAB — URINE CULTURE

## 2020-02-18 MED ORDER — CEPHALEXIN 500 MG PO CAPS: 500.0000 mg | ORAL_CAPSULE | Freq: Three times a day (TID) | ORAL | 0 refills | Status: DC

## 2020-02-18 NOTE — Progress Notes (Signed)
Called patient with urine results.   Meds ordered this encounter  Medications  . cephALEXin (KEFLEX) 500 MG capsule    Sig: Take 1 capsule (500 mg total) by mouth 3 (three) times daily.    Dispense:  21 capsule    Refill:  0  pain is resolved she reports. Return to clinic if any symptoms worsen or change at anytime.  Patient verbalized understanding of all instructions given and denies any further questions at this time.

## 2020-02-18 NOTE — Progress Notes (Signed)
Meds ordered this encounter Medications  cephALEXin (KEFLEX) 500 MG capsule   Sig: Take 1 capsule (500 mg total) by mouth 3 (three) times daily.   Dispense:  21 capsule   Refill:  0 UTI see note patient was called. Return prn OR IF PERSISTS.

## 2020-02-19 ENCOUNTER — Other Ambulatory Visit: Payer: Self-pay | Admitting: Family Medicine

## 2020-02-26 ENCOUNTER — Ambulatory Visit
Admission: RE | Admit: 2020-02-26 | Discharge: 2020-02-26 | Disposition: A | Discharge status: Home / Self Care | Payer: PPO | Attending: Adult Health | Admitting: Adult Health

## 2020-02-26 ENCOUNTER — Other Ambulatory Visit: Payer: Self-pay | Admitting: Adult Health

## 2020-02-26 ENCOUNTER — Ambulatory Visit
Admission: RE | Admit: 2020-02-26 | Discharge: 2020-02-26 | Disposition: A | Discharge status: Home / Self Care | Payer: PPO | Source: Ambulatory Visit | Attending: Adult Health | Admitting: Adult Health

## 2020-02-26 ENCOUNTER — Encounter: Payer: Self-pay | Admitting: Adult Health

## 2020-02-26 ENCOUNTER — Ambulatory Visit (INDEPENDENT_AMBULATORY_CARE_PROVIDER_SITE_OTHER): Payer: PPO | Admitting: Adult Health

## 2020-02-26 ENCOUNTER — Other Ambulatory Visit: Payer: Self-pay

## 2020-02-26 VITALS — BP 128/61 | HR 78 | Temp 98.4°F | Resp 16 | Ht 65.0 in | Wt 278.0 lb

## 2020-02-26 DIAGNOSIS — M25551 Pain in right hip: Secondary | ICD-10-CM

## 2020-02-26 DIAGNOSIS — E559 Vitamin D deficiency, unspecified: Secondary | ICD-10-CM | POA: Diagnosis not present

## 2020-02-26 DIAGNOSIS — I6521 Occlusion and stenosis of right carotid artery: Secondary | ICD-10-CM

## 2020-02-26 DIAGNOSIS — N390 Urinary tract infection, site not specified: Secondary | ICD-10-CM | POA: Diagnosis not present

## 2020-02-26 DIAGNOSIS — M1611 Unilateral primary osteoarthritis, right hip: Secondary | ICD-10-CM | POA: Diagnosis not present

## 2020-02-26 DIAGNOSIS — M25552 Pain in left hip: Secondary | ICD-10-CM | POA: Diagnosis not present

## 2020-02-26 DIAGNOSIS — M545 Low back pain, unspecified: Secondary | ICD-10-CM | POA: Diagnosis not present

## 2020-02-26 MED ORDER — CYCLOBENZAPRINE HCL 10 MG PO TABS: 10.0000 mg | ORAL_TABLET | Freq: Three times a day (TID) | ORAL | 0 refills | Status: DC

## 2020-02-26 MED ORDER — PREDNISONE 10 MG (21) PO TBPK: ORAL_TABLET | ORAL | 0 refills | Status: DC

## 2020-02-26 MED ORDER — CIPROFLOXACIN HCL 500 MG PO TABS: 500.0000 mg | ORAL_TABLET | Freq: Two times a day (BID) | ORAL | 0 refills | Status: AC

## 2020-02-26 NOTE — Patient Instructions (Addendum)
X ray across the street ordered.  Will send urine for repeat culture to be sure cleared.    Given your dental x ray results from June, I have placed a carotid doppler ultrasound for you- you should hear from this with thin two weeks.   Ciprofloxacin tablets What is this medicine? CIPROFLOXACIN (sip roe FLOX a sin) is a quinolone antibiotic. It is used to treat certain kinds of bacterial infections. It will not work for colds, flu, or other viral infections. This medicine may be used for other purposes; ask your health care provider or pharmacist if you have questions. COMMON BRAND NAME(S): Cipro What should I tell my health care provider before I take this medicine? They need to know if you have any of these conditions:  bone problems  diabetes  heart disease  high blood pressure  history of irregular heartbeat  history of low levels of potassium in the blood  joint problems  kidney disease  liver disease  mental illness  myasthenia gravis  seizures  tendon problems  tingling of the fingers or toes, or other nerve disorder  an unusual or allergic reaction to ciprofloxacin, other antibiotics or medicines, foods, dyes, or preservatives  pregnant or trying to get pregnant  breast-feeding How should I use this medicine? Take this medicine by mouth with a full glass of water. Follow the directions on the prescription label. You can take it with or without food. If it upsets your stomach, take it with food. Take your medicine at regular intervals. Do not take your medicine more often than directed. Take all of your medicine as directed even if you think you are better. Do not skip doses or stop your medicine early. Avoid antacids, aluminum, calcium, iron, magnesium, and zinc products for 6 hours before and 2 hours after taking a dose of this medicine. A special MedGuide will be given to you by the pharmacist with each prescription and refill. Be sure to read this  information carefully each time. Talk to your pediatrician regarding the use of this medicine in children. Special care may be needed. Overdosage: If you think you have taken too much of this medicine contact a poison control center or emergency room at once. NOTE: This medicine is only for you. Do not share this medicine with others. What if I miss a dose? If you miss a dose, take it as soon as you can. If it is almost time for your next dose, take only that dose. Do not take double or extra doses. What may interact with this medicine? Do not take this medicine with any of the following medications:  cisapride  dronedarone  flibanserin  lomitapide  pimozide  thioridazine  tizanidine This medicine may also interact with the following medications:  antacids  birth control pills  caffeine  certain medicines for diabetes, like glipizide, glyburide, or insulin  certain medicines that treat or prevent blood clots like warfarin  clozapine  cyclosporine  didanosine buffered tablets or powder  dofetilide  duloxetine  lanthanum carbonate  lidocaine  methotrexate  multivitamins  NSAIDS, medicines for pain and inflammation, like ibuprofen or naproxen  olanzapine  omeprazole  other medicines that prolong the QT interval (cause an abnormal heart rhythm)  phenytoin  probenecid  ropinirole  sevelamer  sildenafil  sucralfate  theophylline  ziprasidone  zolpidem This list may not describe all possible interactions. Give your health care provider a list of all the medicines, herbs, non-prescription drugs, or dietary supplements  you use. Also tell them if you smoke, drink alcohol, or use illegal drugs. Some items may interact with your medicine. What should I watch for while using this medicine? Tell your doctor or health care provider if your symptoms do not start to get better or if they get worse. This medicine may cause serious skin reactions. They can  happen weeks to months after starting the medicine. Contact your health care provider right away if you notice fevers or flu-like symptoms with a rash. The rash may be red or purple and then turn into blisters or peeling of the skin. Or, you might notice a red rash with swelling of the face, lips or lymph nodes in your neck or under your arms. Do not treat diarrhea with over the counter products. Contact your doctor if you have diarrhea that lasts more than 2 days or if it is severe and watery. Check with your doctor or health care provider if you get an attack of severe diarrhea, nausea and vomiting, or if you sweat a lot. The loss of too much body fluid can make it dangerous for you to take this medicine. This medicine may increase blood sugar. Ask your health care provider if changes in diet or medicines are needed if you have diabetes. You may get drowsy or dizzy. Do not drive, use machinery, or do anything that needs mental alertness until you know how this medicine affects you. Do not sit or stand up quickly, especially if you are an older patient. This reduces the risk of dizzy or fainting spells. This medicine can make you more sensitive to the sun. Keep out of the sun. If you cannot avoid being in the sun, wear protective clothing and use sunscreen. Do not use sun lamps or tanning beds/booths. What side effects may I notice from receiving this medicine? Side effects that you should report to your doctor or health care professional as soon as possible:  allergic reactions like skin rash or hives, swelling of the face, lips, or tongue  anxious  bloody or watery diarrhea  confusion  depressed mood  fast, irregular heartbeat  fever  hallucination, loss of contact with reality  joint, muscle, or tendon pain or swelling  loss of memory  pain, tingling, numbness in the hands or feet  redness, blistering, peeling or loosening of the skin, including inside the  mouth  seizures  signs and symptoms of aortic dissection such as sudden chest, stomach, or back pain  signs and symptoms of high blood sugar such as being more thirsty or hungry or having to urinate more than normal. You may also feel very tired or have blurry vision.  signs and symptoms of liver injury like dark yellow or brown urine; general ill feeling or flu-like symptoms; light-colored stools; loss of appetite; nausea; right upper belly pain; unusually weak or tired; yellowing of the eyes or skin  signs and symptoms of low blood sugar such as feeling anxious; confusion; dizziness; increased hunger; unusually weak or tired; sweating; shakiness; cold; irritable; headache; blurred vision; fast heartbeat; loss of consciousness; pale skin  suicidal thoughts or other mood changes  sunburn  unusually weak or tired Side effects that usually do not require medical attention (report to your doctor or health care professional if they continue or are bothersome):  dry mouth  headache  nausea  trouble sleeping This list may not describe all possible side effects. Call your doctor for medical advice about side effects. You may report side effects  to FDA at 1-800-FDA-1088. Where should I keep my medicine? Keep out of the reach of children. Store at room temperature below 30 degrees C (86 degrees F). Keep container tightly closed. Throw away any unused medicine after the expiration date. NOTE: This sheet is a summary. It may not cover all possible information. If you have questions about this medicine, talk to your doctor, pharmacist, or health care provider.  2020 Elsevier/Gold Standard (2018-07-17 11:26:08)  Urinary Tract Infection, Adult A urinary tract infection (UTI) is an infection of any part of the urinary tract. The urinary tract includes:  The kidneys.  The ureters.  The bladder.  The urethra. These organs make, store, and get rid of pee (urine) in the body. What are the  causes? This is caused by germs (bacteria) in your genital area. These germs grow and cause swelling (inflammation) of your urinary tract. What increases the risk? You are more likely to develop this condition if:  You have a small, thin tube (catheter) to drain pee.  You cannot control when you pee or poop (incontinence).  You are female, and: ? You use these methods to prevent pregnancy:  A medicine that kills sperm (spermicide).  A device that blocks sperm (diaphragm). ? You have low levels of a female hormone (estrogen). ? You are pregnant.  You have genes that add to your risk.  You are sexually active.  You take antibiotic medicines.  You have trouble peeing because of: ? A prostate that is bigger than normal, if you are female. ? A blockage in the part of your body that drains pee from the bladder (urethra). ? A kidney stone. ? A nerve condition that affects your bladder (neurogenic bladder). ? Not getting enough to drink. ? Not peeing often enough.  You have other conditions, such as: ? Diabetes. ? A weak disease-fighting system (immune system). ? Sickle cell disease. ? Gout. ? Injury of the spine. What are the signs or symptoms? Symptoms of this condition include:  Needing to pee right away (urgently).  Peeing often.  Peeing small amounts often.  Pain or burning when peeing.  Blood in the pee.  Pee that smells bad or not like normal.  Trouble peeing.  Pee that is cloudy.  Fluid coming from the vagina, if you are female.  Pain in the belly or lower back. Other symptoms include:  Throwing up (vomiting).  No urge to eat.  Feeling mixed up (confused).  Being tired and grouchy (irritable).  A fever.  Watery poop (diarrhea). How is this treated? This condition may be treated with:  Antibiotic medicine.  Other medicines.  Drinking enough water. Follow these instructions at home:  Medicines  Take over-the-counter and prescription  medicines only as told by your doctor.  If you were prescribed an antibiotic medicine, take it as told by your doctor. Do not stop taking it even if you start to feel better. General instructions  Make sure you: ? Pee until your bladder is empty. ? Do not hold pee for a long time. ? Empty your bladder after sex. ? Wipe from front to back after pooping if you are a female. Use each tissue one time when you wipe.  Drink enough fluid to keep your pee pale yellow.  Keep all follow-up visits as told by your doctor. This is important. Contact a doctor if:  You do not get better after 1-2 days.  Your symptoms go away and then come back. Get help right away if:  You have very bad back pain.  You have very bad pain in your lower belly.  You have a fever.  You are sick to your stomach (nauseous).  You are throwing up. Summary  A urinary tract infection (UTI) is an infection of any part of the urinary tract.  This condition is caused by germs in your genital area.  There are many risk factors for a UTI. These include having a small, thin tube to drain pee and not being able to control when you pee or poop.  Treatment includes antibiotic medicines for germs.  Drink enough fluid to keep your pee pale yellow. This information is not intended to replace advice given to you by your health care provider. Make sure you discuss any questions you have with your health care provider. Document Revised: 04/03/2018 Document Reviewed: 10/24/2017 Elsevier Patient Education  [place].  Hip Pain The hip is the joint between the upper legs and the lower pelvis. The bones, cartilage, tendons, and muscles of your hip joint support your body and allow you to move around. Hip pain can range from a minor ache to severe pain in one or both of your hips. The pain may be felt on the inside of the hip joint near the groin, or on the outside near the buttocks and upper thigh. You may also have  swelling or stiffness in your hip area. Follow these instructions at home: Managing pain, stiffness, and swelling      If directed, put ice on the painful area. To do this: ? Put ice in a plastic bag. ? Place a towel between your skin and the bag. ? Leave the ice on for 20 minutes, 2-3 times a day.  If directed, apply heat to the affected area as often as told by your health care provider. Use the heat source that your health care provider recommends, such as a moist heat pack or a heating pad. ? Place a towel between your skin and the heat source. ? Leave the heat on for 20-30 minutes. ? Remove the heat if your skin turns bright red. This is especially important if you are unable to feel pain, heat, or cold. You may have a greater risk of getting burned. Activity  Do exercises as told by your health care provider.  Avoid activities that cause pain. General instructions   Take over-the-counter and prescription medicines only as told by your health care provider.  Keep a journal of your symptoms. Write down: ? How often you have hip pain. ? The location of your pain. ? What the pain feels like. ? What makes the pain worse.  Sleep with a pillow between your legs on your most comfortable side.  Keep all follow-up visits as told by your health care provider. This is important. Contact a health care provider if:  You cannot put weight on your leg.  Your pain or swelling continues or gets worse after one week.  It gets harder to walk.  You have a fever. Get help right away if:  You fall.  You have a sudden increase in pain and swelling in your hip.  Your hip is red or swollen or very tender to touch. Summary  Hip pain can range from a minor ache to severe pain in one or both of your hips.  The pain may be felt on the inside of the hip joint near the groin, or on the outside near the buttocks and upper thigh.  Avoid  activities that cause pain.  Write down how often  you have hip pain, the location of the pain, what makes it worse, and what it feels like. This information is not intended to replace advice given to you by your health care provider. Make sure you discuss any questions you have with your health care provider. Document Revised: 09/01/2018 Document Reviewed: 09/01/2018 Elsevier Patient Education  [place].

## 2020-02-26 NOTE — Progress Notes (Signed)
No fracture on x ray lumbar spine. Moderate to severe disc space narrowing L3-4 and L5-S1.Arthritis noted. She has no numbness or tingling,  Mild joint space narrowing seen on left hip. Mild degenerative change seen on right hip.  She has had previous lumbar surgery, will have to review record.  Recommend follow up with Dr. Rosanna Randy in 2 weeks, sooner if worsens.

## 2020-02-26 NOTE — Progress Notes (Addendum)
Established patient visit   Patient: Sarah Marquez   DOB: 06-21-1955   64 y.o. Female  MRN: [id number] Visit Date: 02/26/2020  Today's healthcare provider: Marcille Buffy, FNP   Chief Complaint  Patient presents with  . Hip Pain   Subjective    Hip Pain  The pain is present in the right hip. The quality of the pain is described as aching. The pain is moderate. The pain has been constant since onset. The symptoms are aggravated by movement.     Patient was seen in the office about 2 weeks ago with similar pain in the left hip. She reports that symptoms were better after taking the prednisone and flexeril at bed time only. Left x ray results below show osteoarthritis.     Marland KitchenShe was treated for UTI with Keflex that cultured as below. No significant urinary  symptoms.   She reports she did have some chills the night before last, no fever when checked it.    She is having right sided pain now in hip that started over the weekend. Denies any pain in left hip as she previously   History of repair of two lumbar rupture disc - She is unsure if she had discectomy or fusion reports Dr. Rocco Pauls fixed over 20 plus years.   Denies any injury or trauma.   Denies any loss of bowel or bladder control.  Denies saddle paresthesias.  Denies radiculopathy/ paresthesias.   She is due for Dexa scan.  She does have vitamin D on her medication list but is not taking everyday.    Patient  denies any fever,chills( none since the weekend x 1 episode) , rash, chest pain, shortness of breath, nausea, vomiting, or diarrhea.   Of note patient asked me to look for paper in chart her dentist sent 09/2019 regarding calcifications in her neck- reviewed as below.    Allergies  Allergen Reactions  . Beclomethasone Swelling    Beconase Nose spray swelled face up  . Sulfa Antibiotics Hives  . Latex Rash    Adhesives, bandaids  . Meloxicam Palpitations     Result Notes   1 Patient  Communication Component 2 wk ago  Urine Culture, Routine Final reportAbnormal   Organism ID, Bacteria Enterococcus faecalisAbnormal        Study Result  Narrative & Impression  CLINICAL DATA:  Left hip pain.  EXAM: DG HIP (WITH OR WITHOUT PELVIS) 2-3V LEFT  COMPARISON:  None.  FINDINGS: No acute fracture or dislocation. Mild bilateral hip joint space narrowing. The pubic symphysis and sacroiliac joints are unremarkable. Lower lumbar spondylosis. Bone mineralization is normal. Soft tissues are unremarkable.  IMPRESSION: 1. Mild bilateral hip osteoarthritis.   Electronically Signed   By: Titus Dubin M.D.   On: 02/12/2020 11:57    Narrative & Impression  CLINICAL DATA:  Left flank and lower abdominal pain for the past 2 days.  EXAM: ABDOMEN - 1 VIEW  COMPARISON:  Abdominal x-ray dated February 04, 2018.  FINDINGS: The bowel gas pattern is normal. No radio-opaque calculi or other significant radiographic abnormality are seen. Small area of amorphous hyperdensity in the left upper quadrant does not overlie the renal shadow and is likely within bowel. Unchanged calcified phleboliths in the right pelvis. Prior cholecystectomy. No acute osseous abnormality.  IMPRESSION: 1. No evident urolithiasis.   Electronically Signed   By: Titus Dubin M.D.   On: 02/12/2020 11:51   Media Information    Document Information  AMB Correspondence  MEMO MAKHLOUF DMD MS PA, M  10/05/2019 15:18  Attached To:  [place], Provider, MD        Medications: Outpatient Medications Prior to Visit  Medication Sig  . albuterol (PROAIR HFA) 108 (90 Base) MCG/ACT inhaler Inhale into the lungs as needed.   . beclomethasone (QVAR) 80 MCG/ACT inhaler Inhale 2 puffs into the lungs 2 (two) times daily.  . Cholecalciferol (VITAMIN D-1000 MAX ST) 1000 UNITS tablet Take by mouth.  . DEXILANT 60 MG capsule Take 1 capsule by mouth  daily. am  . doxepin (SINEQUAN) 10 MG capsule TAKE 1 CAPSULE (10 MG TOTAL) BY MOUTH IN THE MORNING, AT NOON, AND AT BEDTIME.  Marland Kitchen losartan (COZAAR) 50 MG tablet TAKE 1 TABLET BY MOUTH EVERY DAY  . Magnesium 200 MG TABS Take by mouth.  . montelukast (SINGULAIR) 10 MG tablet TAKE 1 TABLET (10 MG TOTAL) BY MOUTH DAILY. (Patient taking differently: Take 10 mg by mouth daily. am)  . naproxen sodium (ALEVE) 220 MG tablet Take 220 mg by mouth daily as needed.  . NON FORMULARY CPAP at night  . rosuvastatin (CRESTOR) 10 MG tablet Take 1 tablet (10 mg total) by mouth daily.  . verapamil (CALAN-SR) 240 MG CR tablet TAKE 1 TABLET BY MOUTH EVERY DAY  . [DISCONTINUED] cyclobenzaprine (FLEXERIL) 10 MG tablet Take 1 tablet (10 mg total) by mouth at bedtime as needed for muscle spasms.  . meclizine (ANTIVERT) 25 MG tablet Take 1 tablet (25 mg total) by mouth 3 (three) times daily as needed for dizziness. (Patient not taking: Reported on 02/17/2019)  . [DISCONTINUED] cephALEXin (KEFLEX) 500 MG capsule Take 1 capsule (500 mg total) by mouth 3 (three) times daily.  . [DISCONTINUED] predniSONE (STERAPRED UNI-PAK 21 TAB) 10 MG (21) TBPK tablet PO: Take 6 tablets on day 1:Take 5 tablets day 2:Take 4 tablets day 3: Take 3 tablets day 4:Take 2 tablets day five: 5 Take 1 tablet day 6   No facility-administered medications prior to visit.    Review of Systems  Constitutional: Negative.   HENT: Negative.   Respiratory: Negative.   Cardiovascular: Negative.   Gastrointestinal: Negative.   Genitourinary: Negative.   Musculoskeletal: Positive for arthralgias and myalgias. Negative for back pain, gait problem, joint swelling, neck pain and neck stiffness.  Skin: Negative.  Negative for rash.  Neurological: Negative.   Psychiatric/Behavioral: Negative.        Objective    BP 128/61   Pulse 78   Temp 98.4 F (36.9 C)   Resp 16   Ht 5\' 5"  (1.651 m)   Wt 278 lb (126.1 kg)   BMI 46.26 kg/m     Physical  Exam Vitals reviewed.  Constitutional:      General: She is not in acute distress.    Appearance: She is not ill-appearing, toxic-appearing or diaphoretic.     Comments: Patient is alert and oriented and responsive to questions Engages in eye contact with provider. Speaks in full sentences without any pauses without any shortness of breath or distress.    HENT:     Head: Normocephalic and atraumatic.  Neck:     Vascular: Normal carotid pulses. Carotid bruit (mild bilateral intermittent) present. No hepatojugular reflux or JVD.     Trachea: Trachea and phonation normal.  Cardiovascular:     Rate and Rhythm: Normal rate and regular rhythm.     Pulses: Normal pulses.  Carotid pulses are 2+ on the right side and 2+ on the left side.    Heart sounds: Normal heart sounds. No murmur heard.  No friction rub. No gallop.   Pulmonary:     Effort: Pulmonary effort is normal. No respiratory distress.     Breath sounds: Normal breath sounds. No stridor. No wheezing, rhonchi or rales.  Chest:     Chest wall: No tenderness.  Abdominal:     General: There is no distension.     Palpations: Abdomen is soft.     Tenderness: There is no abdominal tenderness. There is no right CVA tenderness, left CVA tenderness or guarding.  Musculoskeletal:        General: Normal range of motion.     Cervical back: Normal range of motion and neck supple.     Thoracic back: Normal.     Lumbar back: Spasms and tenderness present. No swelling, edema, deformity, signs of trauma, lacerations or bony tenderness. Normal range of motion. Negative right straight leg raise test and negative left straight leg raise test. No scoliosis.     Right hip: Tenderness present. No deformity, lacerations, bony tenderness or crepitus. Normal range of motion. Normal strength.     Left hip: Normal.     Right upper leg: Normal.     Left upper leg: Normal.     Right lower leg: No edema.     Left lower leg: No edema.   Lymphadenopathy:     Cervical: No cervical adenopathy.  Skin:    General: Skin is warm.     Findings: No lesion or rash.  Neurological:     General: No focal deficit present.     Mental Status: She is alert.     Motor: No weakness.     Gait: Gait normal.  Psychiatric:        Mood and Affect: Mood normal.        Behavior: Behavior normal.        Thought Content: Thought content normal.        Judgment: Judgment normal.      No results found for any visits on 02/26/20.  Assessment & Plan     1. Urinary tract infection without hematuria, site unspecified - POCT Urinalysis Dip Manual Urine culture.  She was unable to give specimen will return with one.   2. Right hip pain - DG Lumbar Spine Complete - DG HIPS BILAT WITH PELVIS 3-4 VIEWS  She had good response to flexeril and steroid pack with left hip, ok to repeat dose pack, avoid nsaids and advise restart naproxen 5 days  afterwards.  May need to follow up with her orthopedic pending x rays and if symptome worsen or persist.   3. Vitamin D deficiency Lab ordered.  - VITAMIN D 25 Hydroxy (Vit-D Deficiency, Fractures)  4. Calcification of right carotid artery- on panoramic x ray 09/2019 Calcifications seen on panoramic, very mild turbulence heard on exam.  They should call within two weeks.  - US Carotid Duplex Bilateral  Keep regular follow up with Jerrol Banana., MD    Will reschedule influenza vaccine for two weeks post antibiotics.    Return in about 2 weeks (around 03/11/2020), or if symptoms worsen or fail to improve, for at any time for any worsening symptoms, Go to Emergency room/ urgent care if worse.      Addressed acute and or chronic medical problems today requiring  30 minutes reviewing patients medical record,labs, counseling  patient regarding patient's conditions, any medications, answering questions regarding health, and coordination of care as needed. After visit summary patient given copy and  reviewed.   Marcille Buffy, [place] [phone] (phone) [phone] (fax)  [place]

## 2020-02-27 LAB — VITAMIN D 25 HYDROXY (VIT D DEFICIENCY, FRACTURES): Vit D, 25-Hydroxy: 16 ng/mL — ABNORMAL LOW (ref 30.0–100.0)

## 2020-02-29 ENCOUNTER — Telehealth: Payer: Self-pay

## 2020-02-29 ENCOUNTER — Other Ambulatory Visit: Payer: Self-pay | Admitting: Adult Health

## 2020-02-29 DIAGNOSIS — E559 Vitamin D deficiency, unspecified: Secondary | ICD-10-CM

## 2020-02-29 LAB — URINE CULTURE

## 2020-02-29 MED ORDER — VITAMIN D (ERGOCALCIFEROL) 1.25 MG (50000 UNIT) PO CAPS
50000.0000 [IU] | ORAL_CAPSULE | ORAL | 0 refills | Status: DC
Start: 2020-02-29 — End: 2020-04-20

## 2020-02-29 NOTE — Progress Notes (Signed)
Urine culture show's mixed urogenital flora, no need to finish antibiotic if no symptoms.  Vitamin D is low, will send in prescription Vitamin D for 10 weeks, advise recheck lab in 3 months.

## 2020-02-29 NOTE — Telephone Encounter (Signed)
-----   Message from Doreen Beam, [place] sent at 02/26/2020  4:52 PM EDT ----- No fracture on x ray lumbar spine. Moderate to severe disc space narrowing L3-4 and L5-S1.Arthritis noted. She has no numbness or tingling,  Mild joint space narrowing seen on left hip. Mild degenerative change seen on right hip.  She has had previous lumbar surgery, will have to review record.  Recommend follow up with Dr. Rosanna Randy in 2 weeks, sooner if worsens.

## 2020-02-29 NOTE — Telephone Encounter (Signed)
Noted thanks °

## 2020-02-29 NOTE — Telephone Encounter (Signed)
Patient was advised and reports that she stopped the antibiotic already. She reports that he right hip is bothering and she is still taking the prednisone and Flexeril. Also she scheduled appointment with Dr. Rosanna Randy already.

## 2020-02-29 NOTE — Progress Notes (Signed)
Meds ordered this encounter  Medications  . Vitamin D, Ergocalciferol, (DRISDOL) 1.25 MG (50000 UNIT) CAPS capsule    Sig: Take 1 capsule (50,000 Units total) by mouth every 7 (seven) days. (taking one tablet per week)    Dispense:  10 capsule    Refill:  0   Orders Placed This Encounter  Procedures  . VITAMIN D 25 Hydroxy (Vit-D Deficiency, Fractures)    Standing Status:   Future    Standing Expiration Date:   06/28/2020

## 2020-02-29 NOTE — Progress Notes (Signed)
Message was addressed in another message.

## 2020-03-03 ENCOUNTER — Ambulatory Visit: Payer: PPO

## 2020-03-04 ENCOUNTER — Ambulatory Visit
Admission: RE | Admit: 2020-03-04 | Discharge: 2020-03-04 | Disposition: A | Discharge status: Home / Self Care | Payer: PPO | Source: Ambulatory Visit | Attending: Adult Health | Admitting: Adult Health

## 2020-03-04 ENCOUNTER — Other Ambulatory Visit: Payer: Self-pay

## 2020-03-04 DIAGNOSIS — I6521 Occlusion and stenosis of right carotid artery: Secondary | ICD-10-CM | POA: Insufficient documentation

## 2020-03-04 DIAGNOSIS — E782 Mixed hyperlipidemia: Secondary | ICD-10-CM | POA: Diagnosis not present

## 2020-03-04 DIAGNOSIS — I10 Essential (primary) hypertension: Secondary | ICD-10-CM | POA: Diagnosis not present

## 2020-03-04 DIAGNOSIS — I6523 Occlusion and stenosis of bilateral carotid arteries: Secondary | ICD-10-CM | POA: Diagnosis not present

## 2020-03-07 ENCOUNTER — Telehealth: Payer: Self-pay

## 2020-03-07 NOTE — Telephone Encounter (Signed)
Bilateral carotid ultrasound does show arteriosclerosis or plaque of carotid arteries that is moderate, I would recommend a referral to Vascular for continued surveillance and follow up, if she is willing to go I will place referral.

## 2020-03-07 NOTE — Telephone Encounter (Signed)
Copied from [place] [phone]. Topic: General - Other >> Mar 07, 2020  1:59 PM Yvette Rack wrote: Reason for CRM: Pt called for ultrasound results. Pt requests call back

## 2020-03-08 NOTE — Telephone Encounter (Signed)
Phone call to pt.  Advised of Sharyn Lull Flinchum's result note and recommendation re: Carotid Ultrasound.  Pt. Stated she rec'd a call today, informing her that "there were no blockages. "  Per pt., she would like to wait and discuss this further with Dr. Rosanna Randy, when she sees him on 03/28/20.

## 2020-03-08 NOTE — Telephone Encounter (Signed)
Attempted to contact patient with no answer. If patient returns call to office okay for [facility] triage to advise patient of ultrasound results as below. KW

## 2020-03-10 ENCOUNTER — Ambulatory Visit (INDEPENDENT_AMBULATORY_CARE_PROVIDER_SITE_OTHER): Payer: PPO

## 2020-03-10 ENCOUNTER — Other Ambulatory Visit: Payer: Self-pay

## 2020-03-10 DIAGNOSIS — Z23 Encounter for immunization: Secondary | ICD-10-CM | POA: Diagnosis not present

## 2020-03-20 ENCOUNTER — Other Ambulatory Visit: Payer: Self-pay | Admitting: Adult Health

## 2020-03-23 DIAGNOSIS — Z1152 Encounter for screening for COVID-19: Secondary | ICD-10-CM | POA: Diagnosis not present

## 2020-03-23 DIAGNOSIS — Z03818 Encounter for observation for suspected exposure to other biological agents ruled out: Secondary | ICD-10-CM | POA: Diagnosis not present

## 2020-03-23 NOTE — Progress Notes (Signed)
I,April Miller,acting as a scribe for Wilhemena Durie, MD.,have documented all relevant documentation on the behalf of Wilhemena Durie, MD,as directed by  Wilhemena Durie, MD while in the presence of Wilhemena Durie, MD.   Established patient visit   Patient: Sarah Marquez   DOB: 1955/12/09   64 y.o. Female  MRN: [id number] Visit Date: 03/28/2020  Today's healthcare provider: Wilhemena Durie, MD   Chief Complaint  Patient presents with  . Back Pain  . Hyperlipidemia  . Hypertension  . Follow-up   Subjective    HPI  Overall patient is doing well since retiring this year.  She does have chronic back pain doubt sciatica.  This seems to be mainly OA and DDD.  She is in need of follow-up lab work.  She is not exercising and has gained weight since she is retired. Hypertension, follow-up  BP Readings from Last 3 Encounters:  03/28/20 (!) 141/68  02/26/20 128/61  02/12/20 (!) 148/74   Wt Readings from Last 3 Encounters:  03/28/20 288 lb (130.6 kg)  02/26/20 278 lb (126.1 kg)  02/12/20 284 lb 6.4 oz (129 kg)     She was last seen for hypertension 7 months ago.  BP at that visit was 121/73. Management since that visit includes; on verapamil and losartan. She reports good compliance with treatment. She is not having side effects. none She is not exercising. She is adherent to low salt diet.   Outside blood pressures are not checking.  She does not smoke.  Use of agents associated with hypertension: none.   Lipid/Cholesterol, follow-up  Last Lipid Panel: Lab Results  Component Value Date   CHOL 211 (H) 08/18/2019   LDLCALC 147 (H) 08/18/2019   HDL 41 08/18/2019   TRIG 128 08/18/2019    She was last seen for this 7 months ago.  Management since that visit includes; labs checked showing-stable. She reports good compliance with treatment. She is not having side effects. none She is following a Regular diet. Current exercise: none  Last metabolic  panel Lab Results  Component Value Date   GLUCOSE 85 02/12/2020   NA 140 02/12/2020   K 4.9 02/12/2020   BUN 18 02/12/2020   CREATININE 0.89 02/12/2020   GFRNONAA 68 02/12/2020   GFRAA 78 02/12/2020   CALCIUM 9.2 02/12/2020   AST 26 02/12/2020   ALT 14 02/12/2020   The ASCVD Risk score Mikey Bussing DC Jr., et al., 2013) failed to calculate for the following reasons:   Unable to determine if patient is Non-Hispanic African American  ASCVD (arteriosclerotic cardiovascular disease) From 11/26/2019-Found on dental x-rays.  Will treat blood pressure, blood sugar and lipids aggressively.  Back pain Patient reports that she is still having back pain. She was seen in the office and treated by Laverna Peace, FNP on 02/12/20 with prednisone taper and Flexeril. Xrays showed arthritis. She reports that pain is worse when standing for long periods of time. Denies numbness and tingling in her legs.       Medications: Outpatient Medications Prior to Visit  Medication Sig  . albuterol (PROAIR HFA) 108 (90 Base) MCG/ACT inhaler Inhale into the lungs as needed.   . beclomethasone (QVAR) 80 MCG/ACT inhaler Inhale 2 puffs into the lungs 2 (two) times daily.  . Cholecalciferol (VITAMIN D-1000 MAX ST) 1000 UNITS tablet Take by mouth.  . cyclobenzaprine (FLEXERIL) 10 MG tablet Take 1 tablet (10 mg total) by mouth 3 (three) times daily.  Marland Kitchen  DEXILANT 60 MG capsule Take 1 capsule by mouth daily. am  . doxepin (SINEQUAN) 10 MG capsule TAKE 1 CAPSULE (10 MG TOTAL) BY MOUTH IN THE MORNING, AT NOON, AND AT BEDTIME.  Marland Kitchen losartan (COZAAR) 50 MG tablet TAKE 1 TABLET BY MOUTH EVERY DAY  . Magnesium 200 MG TABS Take by mouth.  . montelukast (SINGULAIR) 10 MG tablet TAKE 1 TABLET (10 MG TOTAL) BY MOUTH DAILY. (Patient taking differently: Take 10 mg by mouth daily. am)  . naproxen sodium (ALEVE) 220 MG tablet Take 220 mg by mouth daily as needed.  . NON FORMULARY CPAP at night  . rosuvastatin (CRESTOR) 10 MG tablet Take  1 tablet (10 mg total) by mouth daily.  . verapamil (CALAN-SR) 240 MG CR tablet TAKE 1 TABLET BY MOUTH EVERY DAY  . Vitamin D, Ergocalciferol, (DRISDOL) 1.25 MG (50000 UNIT) CAPS capsule Take 1 capsule (50,000 Units total) by mouth every 7 (seven) days. (taking one tablet per week)  . meclizine (ANTIVERT) 25 MG tablet Take 1 tablet (25 mg total) by mouth 3 (three) times daily as needed for dizziness. (Patient not taking: Reported on 02/17/2019)  . predniSONE (STERAPRED UNI-PAK 21 TAB) 10 MG (21) TBPK tablet PO: Take 6 tablets on day 1:Take 5 tablets day 2:Take 4 tablets day 3: Take 3 tablets day 4:Take 2 tablets day five: 5 Take 1 tablet day 6   No facility-administered medications prior to visit.    Review of Systems  Constitutional: Negative for appetite change, chills, fatigue and fever.  Respiratory: Negative for chest tightness and shortness of breath.   Cardiovascular: Negative for chest pain and palpitations.  Gastrointestinal: Negative for abdominal pain, nausea and vomiting.  Neurological: Negative for dizziness and weakness.       Objective    BP (!) 141/68   Pulse 79   Resp 16   Ht 5\' 5"  (1.651 m)   Wt 288 lb (130.6 kg)   BMI 47.93 kg/m  BP Readings from Last 3 Encounters:  03/28/20 (!) 141/68  02/26/20 128/61  02/12/20 (!) 148/74   Wt Readings from Last 3 Encounters:  03/28/20 288 lb (130.6 kg)  02/26/20 278 lb (126.1 kg)  02/12/20 284 lb 6.4 oz (129 kg)      Physical Exam Vitals reviewed.  Constitutional:      Appearance: She is obese.  HENT:     Head: Normocephalic and atraumatic.     Right Ear: External ear normal.     Left Ear: External ear normal.  Eyes:     General: No scleral icterus.    Conjunctiva/sclera: Conjunctivae normal.  Cardiovascular:     Rate and Rhythm: Normal rate and regular rhythm.     Pulses: Normal pulses.     Heart sounds: Normal heart sounds.  Pulmonary:     Effort: Pulmonary effort is normal.     Breath sounds: Normal  breath sounds.  Abdominal:     Palpations: Abdomen is soft.  Musculoskeletal:     Right lower leg: Edema present.     Left lower leg: Edema present.     Comments: Trace edema  Neurological:     General: No focal deficit present.     Mental Status: She is alert and oriented to person, place, and time.  Psychiatric:        Mood and Affect: Mood normal.        Behavior: Behavior normal.        Thought Content: Thought content normal.  Judgment: Judgment normal.       No results found for any visits on 03/28/20.  Assessment & Plan     1. Essential hypertension Controlled on losartan and verapamil - Comprehensive metabolic panel  2. Hyperlipidemia, unspecified hyperlipidemia type On Crestor 10 mg - Lipid panel  3. Iron deficiency anemia, unspecified iron deficiency anemia type  - CBC with Differential/Platelet  4. Vitamin D deficiency  - VITAMIN D 25 Hydroxy (Vit-D Deficiency, Fractures)  5. Osteoarthritis, unspecified osteoarthritis type, unspecified site  - Ambulatory referral to Physical Therapy  6. Back pain, unspecified back location, unspecified back pain laterality, unspecified chronicity Refer to physical therapy and try over-the-counter turmeric daily and topical capsaicin - Ambulatory referral to Physical Therapy  7. DDD (degenerative disc disease), lumbar Try to avoid nonsteroidals chronically due to GI and nephrotoxic effect - Ambulatory referral to Physical Therapy  8. Morbid obesity ([place]) Diet and exercise discussed by impacting lifestyle. 9.  Urticaria Resolved.  Patient can stop doxepin  No follow-ups on file.      I, Wilhemena Durie, MD, have reviewed all documentation for this visit. The documentation on 03/31/20 for the exam, diagnosis, procedures, and orders are all accurate and complete.    Fabrizzio Marcella Cranford Mon, MD  [facility] [phone] (phone) [phone] (fax)  [place]

## 2020-03-28 ENCOUNTER — Encounter: Payer: Self-pay | Admitting: Family Medicine

## 2020-03-28 ENCOUNTER — Other Ambulatory Visit: Payer: Self-pay

## 2020-03-28 ENCOUNTER — Ambulatory Visit (INDEPENDENT_AMBULATORY_CARE_PROVIDER_SITE_OTHER): Payer: PPO | Admitting: Family Medicine

## 2020-03-28 VITALS — BP 141/68 | HR 79 | Resp 16 | Ht 65.0 in | Wt 288.0 lb

## 2020-03-28 DIAGNOSIS — I10 Essential (primary) hypertension: Secondary | ICD-10-CM | POA: Diagnosis not present

## 2020-03-28 DIAGNOSIS — M5136 Other intervertebral disc degeneration, lumbar region: Secondary | ICD-10-CM | POA: Diagnosis not present

## 2020-03-28 DIAGNOSIS — E559 Vitamin D deficiency, unspecified: Secondary | ICD-10-CM

## 2020-03-28 DIAGNOSIS — D509 Iron deficiency anemia, unspecified: Secondary | ICD-10-CM

## 2020-03-28 DIAGNOSIS — E785 Hyperlipidemia, unspecified: Secondary | ICD-10-CM | POA: Diagnosis not present

## 2020-03-28 DIAGNOSIS — M549 Dorsalgia, unspecified: Secondary | ICD-10-CM

## 2020-03-28 DIAGNOSIS — M1990 Unspecified osteoarthritis, unspecified site: Secondary | ICD-10-CM

## 2020-03-28 NOTE — Patient Instructions (Signed)
Try OTC Tumeric and Capsicin to help with pain.  Stop Doxepin.

## 2020-03-29 ENCOUNTER — Other Ambulatory Visit: Payer: Self-pay | Admitting: Family Medicine

## 2020-03-30 DIAGNOSIS — E785 Hyperlipidemia, unspecified: Secondary | ICD-10-CM | POA: Diagnosis not present

## 2020-03-30 DIAGNOSIS — D509 Iron deficiency anemia, unspecified: Secondary | ICD-10-CM | POA: Diagnosis not present

## 2020-03-30 DIAGNOSIS — I10 Essential (primary) hypertension: Secondary | ICD-10-CM | POA: Diagnosis not present

## 2020-03-30 DIAGNOSIS — E559 Vitamin D deficiency, unspecified: Secondary | ICD-10-CM | POA: Diagnosis not present

## 2020-03-31 ENCOUNTER — Other Ambulatory Visit: Payer: Self-pay

## 2020-03-31 ENCOUNTER — Ambulatory Visit
Admission: RE | Admit: 2020-03-31 | Discharge: 2020-03-31 | Disposition: A | Discharge status: Home / Self Care | Payer: PPO | Source: Ambulatory Visit | Attending: Family Medicine | Admitting: Family Medicine

## 2020-03-31 DIAGNOSIS — E2839 Other primary ovarian failure: Secondary | ICD-10-CM | POA: Diagnosis not present

## 2020-03-31 DIAGNOSIS — Z0000 Encounter for general adult medical examination without abnormal findings: Secondary | ICD-10-CM | POA: Insufficient documentation

## 2020-03-31 LAB — CBC WITH DIFFERENTIAL/PLATELET
Basophils Absolute: 0 10*3/uL (ref 0.0–0.2)
Basos: 1 %
EOS (ABSOLUTE): 0.5 10*3/uL — ABNORMAL HIGH (ref 0.0–0.4)
Eos: 8 %
Hematocrit: 40.3 % (ref 34.0–46.6)
Hemoglobin: 13.4 g/dL (ref 11.1–15.9)
Immature Grans (Abs): 0 10*3/uL (ref 0.0–0.1)
Immature Granulocytes: 0 %
Lymphocytes Absolute: 1.6 10*3/uL (ref 0.7–3.1)
Lymphs: 27 %
MCH: 28.8 pg (ref 26.6–33.0)
MCHC: 33.3 g/dL (ref 31.5–35.7)
MCV: 87 fL (ref 79–97)
Monocytes Absolute: 0.4 10*3/uL (ref 0.1–0.9)
Monocytes: 7 %
Neutrophils Absolute: 3.4 10*3/uL (ref 1.4–7.0)
Neutrophils: 57 %
Platelets: 168 10*3/uL (ref 150–450)
RBC: 4.66 x10E6/uL (ref 3.77–5.28)
RDW: 14.5 % (ref 11.7–15.4)
WBC: 6 10*3/uL (ref 3.4–10.8)

## 2020-03-31 LAB — COMPREHENSIVE METABOLIC PANEL
ALT: 13 IU/L (ref 0–32)
AST: 28 IU/L (ref 0–40)
Albumin/Globulin Ratio: 1.3 (ref 1.2–2.2)
Albumin: 3.7 g/dL — ABNORMAL LOW (ref 3.8–4.8)
Alkaline Phosphatase: 114 IU/L (ref 44–121)
BUN/Creatinine Ratio: 20 (ref 12–28)
BUN: 18 mg/dL (ref 8–27)
Bilirubin Total: 0.7 mg/dL (ref 0.0–1.2)
CO2: 24 mmol/L (ref 20–29)
Calcium: 8.8 mg/dL (ref 8.7–10.3)
Chloride: 105 mmol/L (ref 96–106)
Creatinine, Ser: 0.88 mg/dL (ref 0.57–1.00)
GFR calc Af Amer: 79 mL/min/{1.73_m2} (ref >59)
GFR calc non Af Amer: 69 mL/min/{1.73_m2} (ref >59)
Globulin, Total: 2.9 g/dL (ref 1.5–4.5)
Glucose: 88 mg/dL (ref 65–99)
Potassium: 4.3 mmol/L (ref 3.5–5.2)
Sodium: 141 mmol/L (ref 134–144)
Total Protein: 6.6 g/dL (ref 6.0–8.5)

## 2020-03-31 LAB — LIPID PANEL
Chol/HDL Ratio: 3.5 ratio (ref 0.0–4.4)
Cholesterol, Total: 143 mg/dL (ref 100–199)
HDL: 41 mg/dL (ref >39)
LDL Chol Calc (NIH): 81 mg/dL (ref 0–99)
Triglycerides: 116 mg/dL (ref 0–149)
VLDL Cholesterol Cal: 21 mg/dL (ref 5–40)

## 2020-03-31 LAB — VITAMIN D 25 HYDROXY (VIT D DEFICIENCY, FRACTURES): Vit D, 25-Hydroxy: 20.9 ng/mL — ABNORMAL LOW (ref 30.0–100.0)

## 2020-04-13 DIAGNOSIS — H26492 Other secondary cataract, left eye: Secondary | ICD-10-CM | POA: Diagnosis not present

## 2020-04-14 ENCOUNTER — Ambulatory Visit (INDEPENDENT_AMBULATORY_CARE_PROVIDER_SITE_OTHER): Payer: PPO

## 2020-04-14 ENCOUNTER — Other Ambulatory Visit: Payer: Self-pay

## 2020-04-14 DIAGNOSIS — Z23 Encounter for immunization: Secondary | ICD-10-CM

## 2020-04-19 ENCOUNTER — Other Ambulatory Visit: Payer: Self-pay | Admitting: Adult Health

## 2020-04-19 NOTE — Telephone Encounter (Signed)
Requested medication (s) are due for refill today: in 2 weeks  Requested medication (s) are on the active medication list: yes  Last refill: 02/29/20  Future visit scheduled: yes  Notes to clinic:  med not delegated to NT to RF   Requested Prescriptions  Pending Prescriptions Disp Refills   Vitamin D, Ergocalciferol, (DRISDOL) 1.25 MG (50000 UNIT) CAPS capsule [Pharmacy Med Name: VITAMIN D2 1.25MG (50,000 UNIT)] 4 capsule 2    Sig: Take 1 capsule (50,000 Units total) by mouth every 7 (seven) days. (taking one tablet per week)      Endocrinology:  Vitamins - Vitamin D Supplementation Failed - 04/19/2020  4:59 PM      Failed - 50,000 IU strengths are not delegated      Failed - Phosphate in normal range and within 360 days    No results found for: PHOS        Failed - Vitamin D in normal range and within 360 days    Vit D, 25-Hydroxy  Date Value Ref Range Status  03/30/2020 20.9 (L) 30.0 - 100.0 ng/mL Final    Comment:    Vitamin D deficiency has been defined by the Institute of Medicine and an Endocrine Society practice guideline as a level of serum 25-OH vitamin D less than 20 ng/mL (1,2). The Endocrine Society went on to further define vitamin D insufficiency as a level between 21 and 29 ng/mL (2). 1. IOM (Institute of Medicine). 2010. Dietary reference    intakes for calcium and D. [place]: The    [employer]. 2. Holick MF, Binkley [place], Bischoff-Ferrari HA, et al.    Evaluation, treatment, and prevention of vitamin D    deficiency: an Endocrine Society clinical practice    guideline. JCEM. 2011 Jul; 96(7):1911-30.           Passed - Ca in normal range and within 360 days    Calcium  Date Value Ref Range Status  03/30/2020 8.8 8.7 - 10.3 mg/dL Final          Passed - Valid encounter within last 12 months    Recent Outpatient Visits           3 weeks ago Essential hypertension   [facility] Jerrol Banana., MD   1 month  ago Urinary tract infection without hematuria, site unspecified   [facility] Flinchum, Kelby Aline, FNP   2 months ago Spasm of lumbar paraspinous muscle   [place] Flinchum, Kelby Aline, FNP   4 months ago [employer] to [employer] preventive visit   [facility] Jerrol Banana., MD   8 months ago Essential hypertension   [facility] Jerrol Banana., MD       Future Appointments             In 3 months Jerrol Banana., MD [facility] [place]

## 2020-05-01 ENCOUNTER — Other Ambulatory Visit: Payer: Self-pay | Admitting: Family Medicine

## 2020-05-19 DIAGNOSIS — Z03818 Encounter for observation for suspected exposure to other biological agents ruled out: Secondary | ICD-10-CM | POA: Diagnosis not present

## 2020-05-19 DIAGNOSIS — Z20822 Contact with and (suspected) exposure to covid-19: Secondary | ICD-10-CM | POA: Diagnosis not present

## 2020-05-25 DIAGNOSIS — J45991 Cough variant asthma: Secondary | ICD-10-CM | POA: Diagnosis not present

## 2020-05-25 DIAGNOSIS — J209 Acute bronchitis, unspecified: Secondary | ICD-10-CM | POA: Diagnosis not present

## 2020-06-15 DIAGNOSIS — J45991 Cough variant asthma: Secondary | ICD-10-CM | POA: Diagnosis not present

## 2020-06-23 ENCOUNTER — Other Ambulatory Visit: Payer: Self-pay

## 2020-06-23 ENCOUNTER — Ambulatory Visit
Admission: RE | Admit: 2020-06-23 | Discharge: 2020-06-23 | Disposition: A | Discharge status: Home / Self Care | Payer: PPO | Attending: Unknown Physician Specialty | Admitting: Unknown Physician Specialty

## 2020-06-23 ENCOUNTER — Ambulatory Visit
Admission: RE | Admit: 2020-06-23 | Discharge: 2020-06-23 | Disposition: A | Discharge status: Home / Self Care | Payer: PPO | Source: Ambulatory Visit | Attending: Unknown Physician Specialty | Admitting: Unknown Physician Specialty

## 2020-06-23 ENCOUNTER — Other Ambulatory Visit: Payer: Self-pay | Admitting: Unknown Physician Specialty

## 2020-06-23 DIAGNOSIS — R053 Chronic cough: Secondary | ICD-10-CM

## 2020-06-23 DIAGNOSIS — R059 Cough, unspecified: Secondary | ICD-10-CM | POA: Diagnosis not present

## 2020-06-27 ENCOUNTER — Other Ambulatory Visit: Payer: Self-pay | Admitting: Family Medicine

## 2020-06-27 DIAGNOSIS — L509 Urticaria, unspecified: Secondary | ICD-10-CM

## 2020-07-03 DIAGNOSIS — Z20822 Contact with and (suspected) exposure to covid-19: Secondary | ICD-10-CM | POA: Diagnosis not present

## 2020-07-05 DIAGNOSIS — J0140 Acute pansinusitis, unspecified: Secondary | ICD-10-CM | POA: Diagnosis not present

## 2020-07-05 DIAGNOSIS — R059 Cough, unspecified: Secondary | ICD-10-CM | POA: Diagnosis not present

## 2020-07-20 DIAGNOSIS — R053 Chronic cough: Secondary | ICD-10-CM | POA: Diagnosis not present

## 2020-07-26 ENCOUNTER — Encounter: Payer: Self-pay | Admitting: Family Medicine

## 2020-07-26 ENCOUNTER — Other Ambulatory Visit: Payer: Self-pay

## 2020-07-26 ENCOUNTER — Ambulatory Visit (INDEPENDENT_AMBULATORY_CARE_PROVIDER_SITE_OTHER): Payer: PPO | Admitting: Family Medicine

## 2020-07-26 VITALS — BP 139/72 | HR 74 | Temp 98.0°F | Resp 20 | Wt 284.0 lb

## 2020-07-26 DIAGNOSIS — I10 Essential (primary) hypertension: Secondary | ICD-10-CM | POA: Diagnosis not present

## 2020-07-26 DIAGNOSIS — J45991 Cough variant asthma: Secondary | ICD-10-CM

## 2020-07-26 DIAGNOSIS — E668 Other obesity: Secondary | ICD-10-CM

## 2020-07-26 DIAGNOSIS — E785 Hyperlipidemia, unspecified: Secondary | ICD-10-CM | POA: Diagnosis not present

## 2020-07-26 NOTE — Progress Notes (Signed)
Established patient visit   Patient: Sarah Marquez   DOB: 1955/10/03   65 y.o. Female  MRN: [id number] Visit Date: 07/26/2020  Today's healthcare provider: Wilhemena Durie, MD   Chief Complaint  Patient presents with  . Hypertension  . Back Pain   Subjective    HPI  Patient doing well enjoying retirement.  She has no complaints.  She is not exercising. Hypertension, follow-up  BP Readings from Last 3 Encounters:  07/26/20 139/72  03/28/20 (!) 141/68  02/26/20 128/61   Wt Readings from Last 3 Encounters:  07/26/20 284 lb (128.8 kg)  03/28/20 288 lb (130.6 kg)  02/26/20 278 lb (126.1 kg)     She was last seen for hypertension 4 months ago.  BP at that visit was 141/68. Management since that visit includes; Controlled on losartan and verapamil. She reports good compliance with treatment. She is not having side effects.  She is not exercising. She is adherent to low salt diet.   Outside blood pressures are checked occasionally.  She does not smoke.  Use of agents associated with hypertension: none.   Back pain, unspecified back location, unspecified back pain laterality, unspecified chronicity From 03/28/2020-Referred to physical therapy and try over-the-counter turmeric daily and topical capsaicin.  DDD (degenerative disc disease), lumbar From 03/28/2020-Try to avoid nonsteroidals chronically due to GI and nephrotoxic effect.  Urticaria From 03/28/2020-Resolved.  Patient can stop doxepin.       Medications: Outpatient Medications Prior to Visit  Medication Sig  . albuterol (VENTOLIN HFA) 108 (90 Base) MCG/ACT inhaler Inhale into the lungs as needed.   . beclomethasone (QVAR) 80 MCG/ACT inhaler Inhale 2 puffs into the lungs 2 (two) times daily.  . Cholecalciferol 25 MCG (1000 UT) tablet Take by mouth.  . cyclobenzaprine (FLEXERIL) 10 MG tablet Take 1 tablet (10 mg total) by mouth 3 (three) times daily.  Marland Kitchen DEXILANT 60 MG capsule Take 1 capsule by  mouth daily. am  . losartan (COZAAR) 50 MG tablet TAKE 1 TABLET BY MOUTH EVERY DAY  . Magnesium 200 MG TABS Take by mouth.  . montelukast (SINGULAIR) 10 MG tablet TAKE 1 TABLET (10 MG TOTAL) BY MOUTH DAILY. (Patient taking differently: Take 10 mg by mouth daily. am)  . naproxen sodium (ALEVE) 220 MG tablet Take 220 mg by mouth daily as needed.  . NON FORMULARY CPAP at night  . rosuvastatin (CRESTOR) 10 MG tablet Take 1 tablet (10 mg total) by mouth daily.  . verapamil (CALAN-SR) 240 MG CR tablet TAKE 1 TABLET BY MOUTH EVERY DAY  . Vitamin D, Ergocalciferol, (DRISDOL) 1.25 MG (50000 UNIT) CAPS capsule TAKE 1 CAPSULE (50,000 UNITS TOTAL) BY MOUTH EVERY 7 (SEVEN) DAYS. (TAKING ONE TABLET PER WEEK)  . meclizine (ANTIVERT) 25 MG tablet Take 1 tablet (25 mg total) by mouth 3 (three) times daily as needed for dizziness. (Patient not taking: No sig reported)  . predniSONE (STERAPRED UNI-PAK 21 TAB) 10 MG (21) TBPK tablet PO: Take 6 tablets on day 1:Take 5 tablets day 2:Take 4 tablets day 3: Take 3 tablets day 4:Take 2 tablets day five: 5 Take 1 tablet day 6   No facility-administered medications prior to visit.    Review of Systems  Constitutional: Negative for appetite change, chills, fatigue and fever.  Respiratory: Negative for chest tightness and shortness of breath.   Cardiovascular: Negative for chest pain and palpitations.  Gastrointestinal: Negative for abdominal pain, nausea and vomiting.  Neurological: Negative for dizziness  and weakness.        Objective    BP 139/72   Pulse 74   Temp 98 F (36.7 C)   Resp 20   Wt 284 lb (128.8 kg)   BMI 47.26 kg/m  BP Readings from Last 3 Encounters:  07/26/20 139/72  03/28/20 (!) 141/68  02/26/20 128/61   Wt Readings from Last 3 Encounters:  07/26/20 284 lb (128.8 kg)  03/28/20 288 lb (130.6 kg)  02/26/20 278 lb (126.1 kg)       Physical Exam Vitals reviewed.  Constitutional:      Appearance: She is obese.  HENT:     Head:  Normocephalic and atraumatic.     Right Ear: External ear normal.     Left Ear: External ear normal.  Eyes:     General: No scleral icterus.    Conjunctiva/sclera: Conjunctivae normal.  Cardiovascular:     Rate and Rhythm: Normal rate and regular rhythm.     Pulses: Normal pulses.     Heart sounds: Normal heart sounds.  Pulmonary:     Effort: Pulmonary effort is normal.     Breath sounds: Normal breath sounds.  Abdominal:     Palpations: Abdomen is soft.  Musculoskeletal:     Right lower leg: Edema present.     Left lower leg: Edema present.     Comments: Trace edema  Neurological:     General: No focal deficit present.     Mental Status: She is alert and oriented to person, place, and time.  Psychiatric:        Mood and Affect: Mood normal.        Behavior: Behavior normal.        Thought Content: Thought content normal.        Judgment: Judgment normal.       No results found for any visits on 07/26/20.  Assessment & Plan     1. Essential (primary) hypertension Good control on verapamil and losartan  2. Cough variant asthma On Dexilant and butyryl and montelukast  3. Hyperlipidemia, unspecified hyperlipidemia type On rosuvastatin  4. Extreme obesity And exercise stress.   No follow-ups on file.      I, Sarah Durie, MD, have reviewed all documentation for this visit. The documentation on 08/07/20 for the exam, diagnosis, procedures, and orders are all accurate and complete.    Dale Ribeiro Cranford Mon, MD  [facility] [phone] (phone) [phone] (fax)  [place]

## 2020-08-12 DIAGNOSIS — U071 COVID-19: Secondary | ICD-10-CM | POA: Diagnosis not present

## 2020-08-17 ENCOUNTER — Telehealth: Payer: Self-pay | Admitting: Family Medicine

## 2020-08-17 NOTE — Telephone Encounter (Addendum)
Pt is calling to let dr Rosanna Randy know on 08-12-2020 she tested positive for covid. Pt is having cough and sinus issues.  Pt is treat her symptoms with tylenol , flonase and cough pearles. Please advise

## 2020-08-17 NOTE — Telephone Encounter (Signed)
Please review. Refer to covid treatment center?

## 2020-08-18 NOTE — Telephone Encounter (Signed)
Advised patient as below. Patient would like to hold off on the covid clinc.

## 2020-08-18 NOTE — Telephone Encounter (Signed)
Agree with treatment plan but would also refer to COVID treatment.

## 2020-09-14 DIAGNOSIS — Z6841 Body Mass Index (BMI) 40.0 and over, adult: Secondary | ICD-10-CM | POA: Diagnosis not present

## 2020-09-14 DIAGNOSIS — M1711 Unilateral primary osteoarthritis, right knee: Secondary | ICD-10-CM | POA: Diagnosis not present

## 2020-09-14 DIAGNOSIS — M25561 Pain in right knee: Secondary | ICD-10-CM | POA: Diagnosis not present

## 2020-09-27 ENCOUNTER — Telehealth: Payer: Self-pay | Admitting: *Deleted

## 2020-09-27 NOTE — Telephone Encounter (Signed)
Patient wanted to know if she can get her iron check. Patient states she has been feeling fatigue and craving ice lately. Patient is aware Dr. Rosanna Randy is not in the office and okay waiting for a reply. Please advise?

## 2020-09-30 ENCOUNTER — Other Ambulatory Visit: Payer: Self-pay | Admitting: Orthopedic Surgery

## 2020-10-05 DIAGNOSIS — L218 Other seborrheic dermatitis: Secondary | ICD-10-CM | POA: Diagnosis not present

## 2020-10-05 DIAGNOSIS — D225 Melanocytic nevi of trunk: Secondary | ICD-10-CM | POA: Diagnosis not present

## 2020-10-05 DIAGNOSIS — D2262 Melanocytic nevi of left upper limb, including shoulder: Secondary | ICD-10-CM | POA: Diagnosis not present

## 2020-10-05 DIAGNOSIS — X32XXXA Exposure to sunlight, initial encounter: Secondary | ICD-10-CM | POA: Diagnosis not present

## 2020-10-05 DIAGNOSIS — D2261 Melanocytic nevi of right upper limb, including shoulder: Secondary | ICD-10-CM | POA: Diagnosis not present

## 2020-10-05 DIAGNOSIS — L570 Actinic keratosis: Secondary | ICD-10-CM | POA: Diagnosis not present

## 2020-10-05 DIAGNOSIS — D2271 Melanocytic nevi of right lower limb, including hip: Secondary | ICD-10-CM | POA: Diagnosis not present

## 2020-10-05 DIAGNOSIS — Z85828 Personal history of other malignant neoplasm of skin: Secondary | ICD-10-CM | POA: Diagnosis not present

## 2020-10-13 ENCOUNTER — Telehealth: Payer: Self-pay | Admitting: Family Medicine

## 2020-10-13 NOTE — Telephone Encounter (Signed)
This is a courtesy call about joint replacement request from Dr. Youlanda Mighty please have Dr. Rosanna Randy call Daylene Posey

## 2020-10-14 ENCOUNTER — Other Ambulatory Visit: Payer: Self-pay

## 2020-10-14 ENCOUNTER — Encounter
Admission: RE | Admit: 2020-10-14 | Discharge: 2020-10-14 | Disposition: A | Discharge status: Home / Self Care | Payer: PPO | Source: Ambulatory Visit | Attending: Orthopedic Surgery | Admitting: Orthopedic Surgery

## 2020-10-14 DIAGNOSIS — Z01818 Encounter for other preprocedural examination: Secondary | ICD-10-CM | POA: Diagnosis not present

## 2020-10-14 DIAGNOSIS — R799 Abnormal finding of blood chemistry, unspecified: Secondary | ICD-10-CM | POA: Insufficient documentation

## 2020-10-14 DIAGNOSIS — Z01810 Encounter for preprocedural cardiovascular examination: Secondary | ICD-10-CM | POA: Diagnosis not present

## 2020-10-14 HISTORY — DX: Unspecified asthma, uncomplicated: J45.909

## 2020-10-14 LAB — COMPREHENSIVE METABOLIC PANEL
ALT: 13 U/L (ref 0–44)
AST: 26 U/L (ref 15–41)
Albumin: 3.6 g/dL (ref 3.5–5.0)
Alkaline Phosphatase: 83 U/L (ref 38–126)
Anion gap: 4 — ABNORMAL LOW (ref 5–15)
BUN: 15 mg/dL (ref 8–23)
CO2: 29 mmol/L (ref 22–32)
Calcium: 9.2 mg/dL (ref 8.9–10.3)
Chloride: 103 mmol/L (ref 98–111)
Creatinine, Ser: 0.89 mg/dL (ref 0.44–1.00)
GFR, Estimated: >60 mL/min (ref >60)
Glucose, Bld: 87 mg/dL (ref 70–99)
Potassium: 3.8 mmol/L (ref 3.5–5.1)
Sodium: 136 mmol/L (ref 135–145)
Total Bilirubin: 1.3 mg/dL — ABNORMAL HIGH (ref 0.3–1.2)
Total Protein: 7.4 g/dL (ref 6.5–8.1)

## 2020-10-14 LAB — URINALYSIS, ROUTINE W REFLEX MICROSCOPIC
Bacteria, UA: NONE SEEN
Bilirubin Urine: NEGATIVE
Glucose, UA: NEGATIVE mg/dL
Hgb urine dipstick: NEGATIVE
Ketones, ur: 5 mg/dL — AB
Nitrite: NEGATIVE
Protein, ur: NEGATIVE mg/dL
Specific Gravity, Urine: 1.023 (ref 1.005–1.030)
pH: 5 (ref 5.0–8.0)

## 2020-10-14 LAB — CBC WITH DIFFERENTIAL/PLATELET
Abs Immature Granulocytes: 0.01 10*3/uL (ref 0.00–0.07)
Basophils Absolute: 0 10*3/uL (ref 0.0–0.1)
Basophils Relative: 0 %
Eosinophils Absolute: 0.3 10*3/uL (ref 0.0–0.5)
Eosinophils Relative: 6 %
HCT: 40.1 % (ref 36.0–46.0)
Hemoglobin: 13.3 g/dL (ref 12.0–15.0)
Immature Granulocytes: 0 %
Lymphocytes Relative: 24 %
Lymphs Abs: 1.3 10*3/uL (ref 0.7–4.0)
MCH: 27.4 pg (ref 26.0–34.0)
MCHC: 33.2 g/dL (ref 30.0–36.0)
MCV: 82.5 fL (ref 80.0–100.0)
Monocytes Absolute: 0.5 10*3/uL (ref 0.1–1.0)
Monocytes Relative: 9 %
Neutro Abs: 3.3 10*3/uL (ref 1.7–7.7)
Neutrophils Relative %: 61 %
Platelets: 176 10*3/uL (ref 150–400)
RBC: 4.86 MIL/uL (ref 3.87–5.11)
RDW: 14.2 % (ref 11.5–15.5)
WBC: 5.4 10*3/uL (ref 4.0–10.5)
nRBC: 0 % (ref 0.0–0.2)

## 2020-10-14 LAB — TYPE AND SCREEN
ABO/RH(D): B NEG
Antibody Screen: NEGATIVE

## 2020-10-14 LAB — SURGICAL PCR SCREEN
MRSA, PCR: POSITIVE — AB
Staphylococcus aureus: POSITIVE — AB

## 2020-10-14 NOTE — Progress Notes (Signed)
  Perioperative Services  Abnormal Lab Notification  Date: 10/14/20  Name: Sarah Marquez MRN:   [id number]  Re: Abnormal labs noted during PAT appointment  Provider Notified: Hessie Knows, MD Notification mode: Routed and/or faxed via [place] of concern: Lab Results  Component Value Date   STAPHAUREUS POSITIVE (A) 10/14/2020   MRSAPCR POSITIVE (A) 10/14/2020    Notes: Patient is scheduled for a TOTAL KNEE ARTHROPLASTY on 10/25/2020. She is scheduled to receive CEFAZOLIN pre-operatively. Surgical PCR (+) for MRSA; see above. Result forwarded to primary surgery for review and consideration of prophylactic antibiotic change.   This is a Community education officer; no formal response is required.  Honor Loh, MSN, APRN, FNP-C, CEN [facility]  Peri-operative Services Nurse Practitioner Phone: [phone] 10/14/20 3:13 PM

## 2020-10-14 NOTE — Patient Instructions (Signed)
INSTRUCTIONS FOR SURGERY     Your surgery is scheduled for:   Tuesday, June 28TH     To find out your arrival time for the day of surgery,          please call [phone] between 1 pm and 3 pm on : Monday, June 27TH     When you arrive for surgery, report to the [facility]. ONCE THEY HAVE COMPLETED THEIR PROCESS, PROCEED TO THE SECOND FLOOR AND SIGN IN AT THE SURGERY DESK.   REMEMBER: Instructions that are not followed completely may result in serious medical risk,  up to and including death, or upon the discretion of your surgeon and anesthesiologist,            your surgery may need to be rescheduled.  __X__ 1. Do not eat food after midnight the night before your procedure.                    No gum, candy, lozenger, tic tacs, tums or hard candies.                  ABSOLUTELY NOTHING SOLID IN YOUR MOUTH AFTER MIDNIGHT                    You may drink unlimited clear liquids up to 2 hours before you are scheduled to arrive for surgery.                   Do not drink anything within those 2 hours unless you need to take medicine, then take the                   smallest amount you need.  Clear liquids include:  water, apple juice without pulp,                   any flavor Gatorade, Black coffee, black tea.  Sugar may be added but no dairy/ honey /lemon.                        Broth and jello is not considered a clear liquid.  __x__  2. On the morning of surgery, please brush your teeth with toothpaste and water. You may rinse with                  mouthwash if you wish but DO NOT SWALLOW TOOTHPASTE OR MOUTHWASH  __X___3. NO alcohol for 24 hours before or after surgery.  __x___ 4.  Do NOT smoke or use e-cigarettes for 24 HOURS PRIOR TO SURGERY.                      DO NOT  Use any chewable tobacco products for at least 6 hours prior to surgery.  __x___ 5. If you start any new  medication after this appointment and prior to surgery, please                   Bring it with you on the day of surgery.  ___x__ 6. Notify your doctor  if there is any change in your medical condition, such as fever, I                  infection, vomitting, diarrhea or any open sores.  __x___ 7.  USE the CHG SOAP as instructed, the night before surgery and the day of surgery.                   Once you have washed with this soap, do NOT use any of the following: Powders, perfumes                    or lotions. Please do not wear make up, hairpins, clips or nail polish. You may wear deodorant.                                                 Women need to shave 48 hours prior to surgery.                   DO NOT wear ANY jewelry on the day of surgery. If there are rings that are too tight to                    remove easily, please address this prior to the surgery day. Piercings need to be removed.                                                                     NO METAL ON YOUR BODY.                    Do NOT bring any valuables.  If you came to Pre-Admit testing then you will not need license,                     insurance card or credit card.  If you will be staying overnight, please either leave your things in                     the car or have your family be responsible for these items.                      IS NOT RESPONSIBLE FOR BELONGINGS OR VALUABLES.  ___X__ 8. DO NOT wear contact lenses on surgery day.  You may not have dentures,                     Hearing aides, contacts or glasses in the operating room. These items can be                    Placed in the Recovery Room to receive immediately after surgery.  __x___ 9. IF YOU ARE SCHEDULED TO GO HOME ON THE SAME DAY, YOU MUST                   Have someone to drive you home and to stay with you  for the first 24 hours.  Have an arrangement prior to arriving on surgery day.  ___x__ 10. Take the  following medications on the morning of surgery with a sip of water:                              1. PROTONIX                     2. VERAPAMIL                     3. SINGULAIR                     4. QVAR INHALER   ___X__ 11.  Follow any instructions provided to you by your surgeon.                        Such as enema, clear liquid bowel prep                      COMPLETE THE SURGICAL CARB DRINK ON THE MORNING                     OF SURGERY. FINISH IT BEFORE LEAVING YOUR HOME.  __X__  12. STOP ALL ASPIRIN PRODUCTS A WEEK PRIOR TO SURGERY.                       THIS INCLUDES BC POWDERS / GOODIES POWDER  __x___ 13. STOP Anti-inflammatories as of A WEEK PRIOR TO SURGERY.                      This includes IBUPROFEN / MOTRIN / ADVIL / ALEVE/ NAPROXYN                    YOU MAY TAKE TYLENOL ANY TIME PRIOR TO SURGERY.  ___X__ 64.  Stop supplements until after surgery.                     This includes: VITAMIN D                  ___X___17.  Continue to take the following medications but do not take on the morning of surgery:                         LOSARTAN // TRAMINILONE CREAM  ___X___18. If staying overnight, please have appropriate shoes to wear to be able to walk around the unit.                   Wear clean and comfortable clothing to the hospital.   BRING PHONE NUMBERS FOR YOUR CONTACTS HAVE STOOL SOFTENERS AT HOME AND START 2 DAYS PRIOR TO SURGERY. BRING PHONE AND CHARGER.

## 2020-10-21 ENCOUNTER — Other Ambulatory Visit
Admission: RE | Admit: 2020-10-21 | Discharge: 2020-10-21 | Disposition: A | Discharge status: Home / Self Care | Payer: PPO | Source: Ambulatory Visit | Attending: Orthopedic Surgery | Admitting: Orthopedic Surgery

## 2020-10-21 ENCOUNTER — Other Ambulatory Visit: Payer: Self-pay

## 2020-10-21 DIAGNOSIS — Z8616 Personal history of COVID-19: Secondary | ICD-10-CM

## 2020-10-21 DIAGNOSIS — Z01812 Encounter for preprocedural laboratory examination: Secondary | ICD-10-CM | POA: Diagnosis not present

## 2020-10-21 DIAGNOSIS — U071 COVID-19: Secondary | ICD-10-CM | POA: Insufficient documentation

## 2020-10-21 HISTORY — DX: Personal history of COVID-19: Z86.16

## 2020-10-21 LAB — SARS CORONAVIRUS 2 (TAT 6-24 HRS): SARS Coronavirus 2: POSITIVE — AB

## 2020-10-24 NOTE — Progress Notes (Signed)
Pt was covid tested on October 21, 2020. Pt states she was positive in April 2022. This would fall within the 90 days after testing positive that we do not retest. No documentation found in Epic and pt did not have documentation with her. She said she think she has the lab result at home. Pt denied any symptoms October 21, 2020.

## 2020-10-25 ENCOUNTER — Encounter: Payer: Self-pay | Admitting: Orthopedic Surgery

## 2020-10-25 ENCOUNTER — Inpatient Hospital Stay
Admission: RE | Admit: 2020-10-25 | Discharge: 2020-10-27 | DRG: 470 | Disposition: A | Discharge status: Home Health | Payer: PPO | Attending: Orthopedic Surgery | Admitting: Orthopedic Surgery

## 2020-10-25 ENCOUNTER — Inpatient Hospital Stay: Payer: PPO | Admitting: Anesthesiology

## 2020-10-25 ENCOUNTER — Other Ambulatory Visit: Payer: Self-pay

## 2020-10-25 ENCOUNTER — Encounter
Admission: RE | Disposition: A | Discharge status: Home Health | Payer: Self-pay | Source: Home / Self Care | Attending: Orthopedic Surgery

## 2020-10-25 ENCOUNTER — Inpatient Hospital Stay: Payer: PPO

## 2020-10-25 DIAGNOSIS — M1711 Unilateral primary osteoarthritis, right knee: Secondary | ICD-10-CM | POA: Diagnosis not present

## 2020-10-25 DIAGNOSIS — Z96651 Presence of right artificial knee joint: Secondary | ICD-10-CM

## 2020-10-25 DIAGNOSIS — Z8249 Family history of ischemic heart disease and other diseases of the circulatory system: Secondary | ICD-10-CM | POA: Diagnosis not present

## 2020-10-25 DIAGNOSIS — Z8711 Personal history of peptic ulcer disease: Secondary | ICD-10-CM

## 2020-10-25 DIAGNOSIS — I10 Essential (primary) hypertension: Secondary | ICD-10-CM | POA: Diagnosis not present

## 2020-10-25 DIAGNOSIS — G4730 Sleep apnea, unspecified: Secondary | ICD-10-CM | POA: Diagnosis present

## 2020-10-25 DIAGNOSIS — Z90710 Acquired absence of both cervix and uterus: Secondary | ICD-10-CM

## 2020-10-25 DIAGNOSIS — Z8719 Personal history of other diseases of the digestive system: Secondary | ICD-10-CM

## 2020-10-25 DIAGNOSIS — Z6841 Body Mass Index (BMI) 40.0 and over, adult: Secondary | ICD-10-CM

## 2020-10-25 DIAGNOSIS — M7989 Other specified soft tissue disorders: Secondary | ICD-10-CM | POA: Diagnosis not present

## 2020-10-25 DIAGNOSIS — E785 Hyperlipidemia, unspecified: Secondary | ICD-10-CM | POA: Diagnosis not present

## 2020-10-25 DIAGNOSIS — G4733 Obstructive sleep apnea (adult) (pediatric): Secondary | ICD-10-CM | POA: Diagnosis not present

## 2020-10-25 DIAGNOSIS — K219 Gastro-esophageal reflux disease without esophagitis: Secondary | ICD-10-CM | POA: Diagnosis not present

## 2020-10-25 DIAGNOSIS — D649 Anemia, unspecified: Secondary | ICD-10-CM | POA: Diagnosis not present

## 2020-10-25 DIAGNOSIS — R531 Weakness: Secondary | ICD-10-CM | POA: Diagnosis not present

## 2020-10-25 DIAGNOSIS — J45909 Unspecified asthma, uncomplicated: Secondary | ICD-10-CM | POA: Diagnosis not present

## 2020-10-25 DIAGNOSIS — G8918 Other acute postprocedural pain: Secondary | ICD-10-CM

## 2020-10-25 HISTORY — PX: TOTAL KNEE ARTHROPLASTY: SHX125

## 2020-10-25 LAB — CBC
HCT: 36.4 % (ref 36.0–46.0)
Hemoglobin: 11.9 g/dL — ABNORMAL LOW (ref 12.0–15.0)
MCH: 27.3 pg (ref 26.0–34.0)
MCHC: 32.7 g/dL (ref 30.0–36.0)
MCV: 83.5 fL (ref 80.0–100.0)
Platelets: 167 10*3/uL (ref 150–400)
RBC: 4.36 MIL/uL (ref 3.87–5.11)
RDW: 14.4 % (ref 11.5–15.5)
WBC: 12 10*3/uL — ABNORMAL HIGH (ref 4.0–10.5)
nRBC: 0 % (ref 0.0–0.2)

## 2020-10-25 LAB — ABO/RH: ABO/RH(D): B NEG

## 2020-10-25 LAB — CREATININE, SERUM
Creatinine, Ser: 1.22 mg/dL — ABNORMAL HIGH (ref 0.44–1.00)
GFR, Estimated: 49 mL/min — ABNORMAL LOW (ref >60)

## 2020-10-25 SURGERY — ARTHROPLASTY, KNEE, TOTAL
Anesthesia: Spinal | Site: Knee | Laterality: Right

## 2020-10-25 MED ORDER — ACETAMINOPHEN 10 MG/ML IV SOLN
INTRAVENOUS | Status: AC
  Filled 2020-10-25: qty 100

## 2020-10-25 MED ORDER — BUPIVACAINE-EPINEPHRINE (PF) 0.25% -1:200000 IJ SOLN
INTRAMUSCULAR | Status: DC | PRN
  Administered 2020-10-25: 30 mL

## 2020-10-25 MED ORDER — BUDESONIDE 0.5 MG/2ML IN SUSP
0.5000 mg | Freq: Two times a day (BID) | RESPIRATORY_TRACT | Status: DC
  Administered 2020-10-25: 0.5 mg via RESPIRATORY_TRACT
  Filled 2020-10-25 (×3): qty 2

## 2020-10-25 MED ORDER — LACTATED RINGERS IV SOLN: INTRAVENOUS | Status: DC | PRN

## 2020-10-25 MED ORDER — BISACODYL 10 MG RE SUPP: 10.0000 mg | Freq: Every day | RECTAL | Status: DC | PRN

## 2020-10-25 MED ORDER — MENTHOL 3 MG MT LOZG
1.0000 | LOZENGE | OROMUCOSAL | Status: DC | PRN
  Filled 2020-10-25: qty 9

## 2020-10-25 MED ORDER — CEFAZOLIN IN SODIUM CHLORIDE 3-0.9 GM/100ML-% IV SOLN
3.0000 g | Freq: Three times a day (TID) | INTRAVENOUS | Status: AC
  Administered 2020-10-25 (×2): 3 g via INTRAVENOUS
  Filled 2020-10-25 (×4): qty 100

## 2020-10-25 MED ORDER — PHENYLEPHRINE HCL (PRESSORS) 10 MG/ML IV SOLN
INTRAVENOUS | Status: AC
  Filled 2020-10-25: qty 1

## 2020-10-25 MED ORDER — PHENOL 1.4 % MT LIQD
1.0000 | OROMUCOSAL | Status: DC | PRN
Start: 2020-10-25 — End: 2020-10-27
  Filled 2020-10-25: qty 177

## 2020-10-25 MED ORDER — FENTANYL CITRATE (PF) 100 MCG/2ML IJ SOLN
INTRAMUSCULAR | Status: AC
  Filled 2020-10-25: qty 2

## 2020-10-25 MED ORDER — ONDANSETRON HCL 4 MG/2ML IJ SOLN
INTRAMUSCULAR | Status: DC | PRN
  Administered 2020-10-25: 4 mg via INTRAVENOUS

## 2020-10-25 MED ORDER — DOXEPIN HCL 10 MG PO CAPS
10.0000 mg | ORAL_CAPSULE | Freq: Every day | ORAL | Status: DC | PRN
  Filled 2020-10-25: qty 1

## 2020-10-25 MED ORDER — ONDANSETRON HCL 4 MG PO TABS
4.0000 mg | ORAL_TABLET | Freq: Four times a day (QID) | ORAL | Status: DC | PRN
  Administered 2020-10-25: 4 mg via ORAL
  Filled 2020-10-25: qty 1

## 2020-10-25 MED ORDER — DEXAMETHASONE SODIUM PHOSPHATE 10 MG/ML IJ SOLN
INTRAMUSCULAR | Status: DC | PRN
  Administered 2020-10-25: 5 mg via INTRAVENOUS

## 2020-10-25 MED ORDER — PROPOFOL 1000 MG/100ML IV EMUL
INTRAVENOUS | Status: AC
  Filled 2020-10-25: qty 100

## 2020-10-25 MED ORDER — CHLORHEXIDINE GLUCONATE 0.12 % MT SOLN: 15.0000 mL | Freq: Once | OROMUCOSAL | Status: AC

## 2020-10-25 MED ORDER — POLYETHYLENE GLYCOL 3350 17 G PO PACK: 17.0000 g | PACK | Freq: Every day | ORAL | Status: DC | PRN

## 2020-10-25 MED ORDER — MORPHINE SULFATE (PF) 2 MG/ML IV SOLN: 0.5000 mg | INTRAVENOUS | Status: DC | PRN

## 2020-10-25 MED ORDER — ACETAMINOPHEN 10 MG/ML IV SOLN
INTRAVENOUS | Status: DC | PRN
  Administered 2020-10-25: 1000 mg via INTRAVENOUS

## 2020-10-25 MED ORDER — EPHEDRINE SULFATE 50 MG/ML IJ SOLN
INTRAMUSCULAR | Status: DC | PRN
  Administered 2020-10-25 (×6): 10 mg via INTRAVENOUS

## 2020-10-25 MED ORDER — FENTANYL CITRATE (PF) 100 MCG/2ML IJ SOLN: 25.0000 ug | INTRAMUSCULAR | Status: DC | PRN

## 2020-10-25 MED ORDER — VERAPAMIL HCL ER 240 MG PO TBCR
240.0000 mg | EXTENDED_RELEASE_TABLET | Freq: Every day | ORAL | Status: DC
  Administered 2020-10-26 – 2020-10-27 (×2): 240 mg via ORAL
  Filled 2020-10-25 (×2): qty 1

## 2020-10-25 MED ORDER — ONDANSETRON HCL 4 MG/2ML IJ SOLN: 4.0000 mg | Freq: Once | INTRAMUSCULAR | Status: DC | PRN

## 2020-10-25 MED ORDER — LACTATED RINGERS IV SOLN: INTRAVENOUS | Status: DC

## 2020-10-25 MED ORDER — SODIUM CHLORIDE 0.9 % IV SOLN: INTRAVENOUS | Status: DC

## 2020-10-25 MED ORDER — LOSARTAN POTASSIUM 50 MG PO TABS
50.0000 mg | ORAL_TABLET | Freq: Every day | ORAL | Status: DC
  Administered 2020-10-26 – 2020-10-27 (×2): 50 mg via ORAL
  Filled 2020-10-25 (×2): qty 1

## 2020-10-25 MED ORDER — DOCUSATE SODIUM 100 MG PO CAPS
100.0000 mg | ORAL_CAPSULE | Freq: Two times a day (BID) | ORAL | Status: DC
  Administered 2020-10-25 – 2020-10-27 (×5): 100 mg via ORAL
  Filled 2020-10-25 (×5): qty 1

## 2020-10-25 MED ORDER — MORPHINE SULFATE (PF) 10 MG/ML IV SOLN
INTRAVENOUS | Status: DC | PRN
  Administered 2020-10-25: 10 mg

## 2020-10-25 MED ORDER — MONTELUKAST SODIUM 10 MG PO TABS
10.0000 mg | ORAL_TABLET | Freq: Every day | ORAL | Status: DC
  Administered 2020-10-26 – 2020-10-27 (×2): 10 mg via ORAL
  Filled 2020-10-25 (×2): qty 1

## 2020-10-25 MED ORDER — SODIUM CHLORIDE 0.9 % IV SOLN
INTRAVENOUS | Status: DC | PRN
  Administered 2020-10-25: 60 mL

## 2020-10-25 MED ORDER — MIDAZOLAM HCL 5 MG/5ML IJ SOLN
INTRAMUSCULAR | Status: DC | PRN
  Administered 2020-10-25 (×2): 1 mg via INTRAVENOUS

## 2020-10-25 MED ORDER — MORPHINE SULFATE (PF) 10 MG/ML IV SOLN
INTRAVENOUS | Status: AC
  Filled 2020-10-25: qty 1

## 2020-10-25 MED ORDER — METHOCARBAMOL 1000 MG/10ML IJ SOLN
500.0000 mg | Freq: Four times a day (QID) | INTRAMUSCULAR | Status: DC | PRN
  Filled 2020-10-25: qty 5

## 2020-10-25 MED ORDER — EPHEDRINE 5 MG/ML INJ
INTRAVENOUS | Status: AC
  Filled 2020-10-25: qty 10

## 2020-10-25 MED ORDER — HYDROCODONE-ACETAMINOPHEN 5-325 MG PO TABS
1.0000 | ORAL_TABLET | ORAL | Status: DC | PRN
  Administered 2020-10-25: 1 via ORAL
  Administered 2020-10-25 – 2020-10-27 (×4): 2 via ORAL
  Filled 2020-10-25 (×3): qty 2
  Filled 2020-10-25: qty 1
  Filled 2020-10-25: qty 2

## 2020-10-25 MED ORDER — 0.9 % SODIUM CHLORIDE (POUR BTL) OPTIME
TOPICAL | Status: DC | PRN
  Administered 2020-10-25: 1000 mL

## 2020-10-25 MED ORDER — CHLORHEXIDINE GLUCONATE 0.12 % MT SOLN
OROMUCOSAL | Status: AC
  Administered 2020-10-25: 15 mL via OROMUCOSAL
  Filled 2020-10-25: qty 15

## 2020-10-25 MED ORDER — PROPOFOL 500 MG/50ML IV EMUL
INTRAVENOUS | Status: DC | PRN
  Administered 2020-10-25: 75 ug/kg/min via INTRAVENOUS

## 2020-10-25 MED ORDER — METHOCARBAMOL 500 MG PO TABS
500.0000 mg | ORAL_TABLET | Freq: Four times a day (QID) | ORAL | Status: DC | PRN
  Administered 2020-10-25 – 2020-10-27 (×4): 500 mg via ORAL
  Filled 2020-10-25 (×4): qty 1

## 2020-10-25 MED ORDER — PANTOPRAZOLE SODIUM 40 MG PO TBEC
40.0000 mg | DELAYED_RELEASE_TABLET | Freq: Two times a day (BID) | ORAL | Status: DC
  Administered 2020-10-25 – 2020-10-27 (×4): 40 mg via ORAL
  Filled 2020-10-25 (×4): qty 1

## 2020-10-25 MED ORDER — ACETAMINOPHEN 325 MG PO TABS: 325.0000 mg | ORAL_TABLET | Freq: Four times a day (QID) | ORAL | Status: DC | PRN

## 2020-10-25 MED ORDER — DIPHENHYDRAMINE HCL 12.5 MG/5ML PO ELIX: 12.5000 mg | ORAL_SOLUTION | ORAL | Status: DC | PRN

## 2020-10-25 MED ORDER — BUPIVACAINE LIPOSOME 1.3 % IJ SUSP
INTRAMUSCULAR | Status: AC
  Filled 2020-10-25: qty 20

## 2020-10-25 MED ORDER — BUPIVACAINE HCL (PF) 0.5 % IJ SOLN
INTRAMUSCULAR | Status: AC
  Filled 2020-10-25: qty 10

## 2020-10-25 MED ORDER — CEFAZOLIN IN SODIUM CHLORIDE 3-0.9 GM/100ML-% IV SOLN
3.0000 g | INTRAVENOUS | Status: AC
  Administered 2020-10-25: 3 g via INTRAVENOUS
  Filled 2020-10-25: qty 100

## 2020-10-25 MED ORDER — PROPOFOL 10 MG/ML IV BOLUS
INTRAVENOUS | Status: AC
  Filled 2020-10-25: qty 20

## 2020-10-25 MED ORDER — SODIUM CHLORIDE 0.9 % IV SOLN
INTRAVENOUS | Status: DC | PRN
  Administered 2020-10-25: 50 ug/min via INTRAVENOUS

## 2020-10-25 MED ORDER — MECLIZINE HCL 25 MG PO TABS
25.0000 mg | ORAL_TABLET | Freq: Three times a day (TID) | ORAL | Status: DC | PRN
  Filled 2020-10-25: qty 1

## 2020-10-25 MED ORDER — ONDANSETRON HCL 4 MG/2ML IJ SOLN: 4.0000 mg | Freq: Four times a day (QID) | INTRAMUSCULAR | Status: DC | PRN

## 2020-10-25 MED ORDER — BUPIVACAINE HCL (PF) 0.5 % IJ SOLN
INTRAMUSCULAR | Status: DC | PRN
  Administered 2020-10-25: 2.5 mL

## 2020-10-25 MED ORDER — BUPIVACAINE-EPINEPHRINE (PF) 0.25% -1:200000 IJ SOLN
INTRAMUSCULAR | Status: AC
  Filled 2020-10-25: qty 30

## 2020-10-25 MED ORDER — HYDROCODONE-ACETAMINOPHEN 7.5-325 MG PO TABS
1.0000 | ORAL_TABLET | ORAL | Status: DC | PRN
Start: 2020-10-25 — End: 2020-10-27

## 2020-10-25 MED ORDER — TRAMADOL HCL 50 MG PO TABS
50.0000 mg | ORAL_TABLET | Freq: Four times a day (QID) | ORAL | Status: DC
  Administered 2020-10-25 – 2020-10-27 (×9): 50 mg via ORAL
  Filled 2020-10-25 (×9): qty 1

## 2020-10-25 MED ORDER — ENOXAPARIN SODIUM 30 MG/0.3ML IJ SOSY
30.0000 mg | PREFILLED_SYRINGE | Freq: Two times a day (BID) | INTRAMUSCULAR | Status: DC
  Administered 2020-10-26 – 2020-10-27 (×3): 30 mg via SUBCUTANEOUS
  Filled 2020-10-25 (×3): qty 0.3

## 2020-10-25 MED ORDER — ROSUVASTATIN CALCIUM 10 MG PO TABS
10.0000 mg | ORAL_TABLET | Freq: Every day | ORAL | Status: DC
  Administered 2020-10-25 – 2020-10-27 (×3): 10 mg via ORAL
  Filled 2020-10-25 (×3): qty 1

## 2020-10-25 MED ORDER — MIDAZOLAM HCL 2 MG/2ML IJ SOLN
INTRAMUSCULAR | Status: AC
  Filled 2020-10-25: qty 2

## 2020-10-25 MED ORDER — VITAMIN D (ERGOCALCIFEROL) 1.25 MG (50000 UNIT) PO CAPS: 50000.0000 [IU] | ORAL_CAPSULE | ORAL | Status: DC

## 2020-10-25 MED ORDER — SODIUM CHLORIDE FLUSH 0.9 % IV SOLN
INTRAVENOUS | Status: AC
  Filled 2020-10-25: qty 40

## 2020-10-25 MED ORDER — ZOLPIDEM TARTRATE 5 MG PO TABS: 5.0000 mg | ORAL_TABLET | Freq: Every evening | ORAL | Status: DC | PRN

## 2020-10-25 MED ORDER — MAGNESIUM CITRATE PO SOLN
1.0000 | Freq: Once | ORAL | Status: DC | PRN
  Filled 2020-10-25 (×2): qty 296

## 2020-10-25 MED ORDER — ORAL CARE MOUTH RINSE: 15.0000 mL | Freq: Once | OROMUCOSAL | Status: AC

## 2020-10-25 MED ORDER — ALUM & MAG HYDROXIDE-SIMETH 200-200-20 MG/5ML PO SUSP: 30.0000 mL | ORAL | Status: DC | PRN

## 2020-10-25 SURGICAL SUPPLY — 69 items
BLADE SAGITTAL 25.0X1.19X90 (BLADE) ×2 IMPLANT
BLADE SAW 90X13X1.19 OSCILLAT (BLADE) ×2 IMPLANT
BNDG ELASTIC 6X5.8 VLCR STR LF (GAUZE/BANDAGES/DRESSINGS) ×2 IMPLANT
CANISTER SUCT 1200ML W/VALVE (MISCELLANEOUS) ×2 IMPLANT
CANISTER WOUND CARE 500ML ATS (WOUND CARE) ×2 IMPLANT
CEMENT HV SMART SET (Cement) ×4 IMPLANT
CHLORAPREP W/TINT 26 (MISCELLANEOUS) ×4 IMPLANT
COOLER POLAR GLACIER W/PUMP (MISCELLANEOUS) ×2 IMPLANT
COVER WAND RF STERILE (DRAPES) ×2 IMPLANT
CUFF TOURN SGL QUICK 24 (TOURNIQUET CUFF)
CUFF TOURN SGL QUICK 34 (TOURNIQUET CUFF)
CUFF TRNQT CYL 24X4X16.5-23 (TOURNIQUET CUFF) IMPLANT
CUFF TRNQT CYL 34X4.125X (TOURNIQUET CUFF) IMPLANT
DRAPE 3/4 80X56 (DRAPES) ×4 IMPLANT
DRSG MEPILEX SACRM 8.7X9.8 (GAUZE/BANDAGES/DRESSINGS) ×2 IMPLANT
ELECT CAUTERY BLADE 6.4 (BLADE) ×2 IMPLANT
ELECT REM PT RETURN 9FT ADLT (ELECTROSURGICAL) ×2
ELECTRODE REM PT RTRN 9FT ADLT (ELECTROSURGICAL) ×1 IMPLANT
FEMORAL COMP CEMENTED SZ5 (Femur) ×2 IMPLANT
GAUZE 4X4 16PLY ~~LOC~~+RFID DBL (SPONGE) ×4 IMPLANT
GAUZE SPONGE 4X4 12PLY STRL (GAUZE/BANDAGES/DRESSINGS) ×2 IMPLANT
GAUZE XEROFORM 1X8 LF (GAUZE/BANDAGES/DRESSINGS) ×2 IMPLANT
GLOVE SURG ORTHO LTX SZ8 (GLOVE) ×2 IMPLANT
GLOVE SURG SYN 9.0  PF PI (GLOVE) ×2
GLOVE SURG SYN 9.0 PF PI (GLOVE) ×1 IMPLANT
GLOVE SURG UNDER LTX SZ8 (GLOVE) ×2 IMPLANT
GLOVE SURG UNDER POLY LF SZ9 (GLOVE) ×2 IMPLANT
GOWN SRG 2XL LVL 4 RGLN SLV (GOWNS) ×1 IMPLANT
GOWN STRL NON-REIN 2XL LVL4 (GOWNS) ×2
GOWN STRL REUS W/ TWL LRG LVL3 (GOWN DISPOSABLE) ×1 IMPLANT
GOWN STRL REUS W/ TWL XL LVL3 (GOWN DISPOSABLE) ×1 IMPLANT
GOWN STRL REUS W/TWL LRG LVL3 (GOWN DISPOSABLE) ×2
GOWN STRL REUS W/TWL XL LVL3 (GOWN DISPOSABLE) ×2
HOLDER FOLEY CATH W/STRAP (MISCELLANEOUS) ×2 IMPLANT
HOOD PEEL AWAY FLYTE STAYCOOL (MISCELLANEOUS) ×4 IMPLANT
INSERT TIBIAL SZ4 RIGHT 10MM (Insert) ×2 IMPLANT
IRRIGATION SURGIPHOR STRL (IV SOLUTION) IMPLANT
IV NS IRRIG 3000ML ARTHROMATIC (IV SOLUTION) ×2 IMPLANT
KIT PREVENA INCISION MGT20CM45 (CANNISTER) ×2 IMPLANT
KIT TURNOVER KIT A (KITS) ×2 IMPLANT
MANIFOLD NEPTUNE II (INSTRUMENTS) ×2 IMPLANT
NDL SAFETY ECLIPSE 18X1.5 (NEEDLE) ×1 IMPLANT
NEEDLE HYPO 18GX1.5 SHARP (NEEDLE) ×2
NEEDLE SPNL 18GX3.5 QUINCKE PK (NEEDLE) ×2 IMPLANT
NEEDLE SPNL 20GX3.5 QUINCKE YW (NEEDLE) ×2 IMPLANT
NS IRRIG 1000ML POUR BTL (IV SOLUTION) ×2 IMPLANT
PACK TOTAL KNEE (MISCELLANEOUS) ×2 IMPLANT
PAD WRAPON POLAR KNEE (MISCELLANEOUS) ×1 IMPLANT
PATELLA RESURFACING MEDACTA 02 (Bone Implant) ×2 IMPLANT
PENCIL SMOKE EVACUATOR COATED (MISCELLANEOUS) ×2 IMPLANT
PULSAVAC PLUS IRRIG FAN TIP (DISPOSABLE) ×2
SCALPEL PROTECTED #10 DISP (BLADE) ×4 IMPLANT
SPONGE T-LAP 18X18 ~~LOC~~+RFID (SPONGE) ×8 IMPLANT
STAPLER SKIN PROX 35W (STAPLE) ×2 IMPLANT
STEM EXTENSION 11MMX30MM (Stem) ×2 IMPLANT
SUCTION FRAZIER HANDLE 10FR (MISCELLANEOUS) ×2
SUCTION TUBE FRAZIER 10FR DISP (MISCELLANEOUS) ×1 IMPLANT
SUT DVC 2 QUILL PDO  T11 36X36 (SUTURE) ×2
SUT DVC 2 QUILL PDO T11 36X36 (SUTURE) ×1 IMPLANT
SUT ETHIBOND 2 V 37 (SUTURE) IMPLANT
SUT V-LOC 90 ABS DVC 3-0 CL (SUTURE) ×2 IMPLANT
SYR 20ML LL LF (SYRINGE) ×2 IMPLANT
SYR 50ML LL SCALE MARK (SYRINGE) ×4 IMPLANT
TIBIAL TRAY FIXED 4 MEDACTA 02 (Joint) ×2 IMPLANT
TIP FAN IRRIG PULSAVAC PLUS (DISPOSABLE) ×1 IMPLANT
TOWEL OR 17X26 4PK STRL BLUE (TOWEL DISPOSABLE) ×2 IMPLANT
TOWER CARTRIDGE SMART MIX (DISPOSABLE) ×2 IMPLANT
TRAY FOLEY MTR SLVR 16FR STAT (SET/KITS/TRAYS/PACK) ×2 IMPLANT
WRAPON POLAR PAD KNEE (MISCELLANEOUS) ×2

## 2020-10-25 NOTE — Transfer of Care (Signed)
Immediate Anesthesia Transfer of Care Note  Patient: Sarah Marquez  Procedure(s) Performed: TOTAL KNEE ARTHROPLASTY - Rachelle Hora to assist (Right: Knee)  Patient Location: PACU  Anesthesia Type:Spinal  Level of Consciousness: sedated  Airway & Oxygen Therapy: Patient Spontanous Breathing and Patient connected to face mask oxygen  Post-op Assessment: Report given to RN and Post -op Vital signs reviewed and stable  Post vital signs: Reviewed and stable  Last Vitals:  Vitals Value Taken Time  BP 100/39 10/25/20 0932  Temp 36.7 C 10/25/20 0939  Pulse 61 10/25/20 0942  Resp 21 10/25/20 0942  SpO2 100 % 10/25/20 0942  Vitals shown include unvalidated device data.  Last Pain:  Vitals:   10/25/20 0939  TempSrc:   PainSc: 0-No pain         Complications: No notable events documented.

## 2020-10-25 NOTE — Anesthesia Procedure Notes (Addendum)
Spinal  Patient location during procedure: OR Start time: 10/25/2020 7:22 AM End time: 10/25/2020 7:32 AM Reason for block: surgical anesthesia Staffing Performed: resident/CRNA and anesthesiologist  Anesthesiologist: Emmie Niemann, MD Resident/CRNA: Justus Memory, CRNA Preanesthetic Checklist Completed: patient identified, IV checked, site marked, risks and benefits discussed, surgical consent, monitors and equipment checked, pre-op evaluation and timeout performed Spinal Block Patient position: sitting Prep: ChloraPrep Patient monitoring: heart rate, continuous pulse ox and blood pressure Approach: midline Location: L2-3 Injection technique: single-shot Needle Needle type: Introducer and Pencil-Tip  Needle gauge: 25 G Needle length: 12.7 cm Assessment Events: CSF return Additional Notes Negative paresthesia. Negative blood return. Positive free-flowing CSF. Expiration date of kit checked and confirmed. Patient tolerated procedure well, without complications.

## 2020-10-25 NOTE — TOC Progression Note (Addendum)
Transition of Care [facility]) - Progression Note    Patient Details  Name: Sarah Marquez MRN: [id number] Date of Birth: 04/23/1956  Transition of Care [facility]) CM/SW [place], RN Phone Number: 10/25/2020, 3:39 PM  Clinical Narrative:   Patient lives at home with spouse, who can assist if needed.  Patient states she does not have concerns with getting to appointments or getting medications.  She is able to take medications as directed.    Patient does not currently have home health, but is receptive to home health.  Home Health is a challenge due to patient's insurance, but inquiries are out to [place], [place], and [facility] at this time.  TOC contact information given, TOC will follow to discharge.  Addendum 1600:  Patient will be seen by [facility].     Expected Discharge Plan: [place] Barriers to Discharge: Continued Medical Work up  Expected Discharge Plan and Services Expected Discharge Plan: [place]   Discharge Planning Services: CM Consult Post Acute Care Choice: [place] arrangements for the past 2 months: Single Family Home                 DME Arranged: 3-N-1, Walker rolling DME Agency: [employer] Date DME Agency Contacted: 10/25/20   Representative spoke with at DME Agency: Suanne Marker HH Arranged: PT, OT           Social Determinants of Health ([place]) Interventions    Readmission Risk Interventions No flowsheet data found.

## 2020-10-25 NOTE — Anesthesia Preprocedure Evaluation (Signed)
Anesthesia Evaluation  Patient identified by MRN, date of birth, ID band Patient awake    Reviewed: Allergy & Precautions, NPO status , Patient's Chart, lab work & pertinent test results  History of Anesthesia Complications Negative for: history of anesthetic complications  Airway Mallampati: III  TM Distance: >3 FB Neck ROM: Full    Dental  (+) Poor Dentition, Missing   Pulmonary asthma (daily controller) , sleep apnea ,    breath sounds clear to auscultation- rhonchi (-) wheezing      Cardiovascular hypertension, Pt. on medications (-) CAD, (-) Past MI, (-) Cardiac Stents and (-) CABG  Rhythm:Regular Rate:Normal - Systolic murmurs and - Diastolic murmurs    Neuro/Psych neg Seizures negative neurological ROS  negative psych ROS   GI/Hepatic Neg liver ROS, PUD, GERD  ,  Endo/Other  negative endocrine ROSneg diabetes  Renal/GU negative Renal ROS     Musculoskeletal  (+) Arthritis ,   Abdominal (+) + obese,   Peds  Hematology  (+) anemia ,   Anesthesia Other Findings Past Medical History: No date: Arthritis     Comment:  neck, knees No date: Asthma No date: Cancer ([place])     Comment:  skin, basal cell removed from neck, back and foot No date: Carpal tunnel syndrome No date: Colon polyps No date: Difficult intubation     Comment:  pt unaware No date: Gastric ulcer No date: GERD (gastroesophageal reflux disease)     Comment:  on dexilant No date: Gout No date: Hyperlipidemia No date: Hypertension     Comment:  controlled on meds No date: Iron deficiency anemia No date: Sleep apnea     Comment:  does not use cpap No date: Vertigo     Comment:  last episode 12/2018 No date: Wears dentures     Comment:  partial upper   Reproductive/Obstetrics                             Lab Results  Component Value Date   WBC 5.4 10/14/2020   HGB 13.3 10/14/2020   HCT 40.1 10/14/2020   MCV 82.5  10/14/2020   PLT 176 10/14/2020    Anesthesia Physical Anesthesia Plan  ASA: 3  Anesthesia Plan: Spinal   Post-op Pain Management:    Induction:   PONV Risk Score and Plan: 2 and Propofol infusion  Airway Management Planned: Natural Airway  Additional Equipment:   Intra-op Plan:   Post-operative Plan:   Informed Consent: I have reviewed the patients History and Physical, chart, labs and discussed the procedure including the risks, benefits and alternatives for the proposed anesthesia with the patient or authorized representative who has indicated his/her understanding and acceptance.     Dental advisory given  Plan Discussed with: CRNA and Anesthesiologist  Anesthesia Plan Comments:         Anesthesia Quick Evaluation

## 2020-10-25 NOTE — Op Note (Signed)
10/25/2020  9:32 AM  PATIENT:  Sarah Marquez   MRN: [id number]  PRE-OPERATIVE DIAGNOSIS:  Primary localized osteoarthritis of right knee   POST-OPERATIVE DIAGNOSIS:  Same   PROCEDURE:  Procedure(s): Right TOTAL KNEE ARTHROPLASTY   SURGEON: Laurene Footman, MD   ASSISTANTS: Rachelle Hora, PA-C   ANESTHESIA:   spinal   EBL:     BLOOD ADMINISTERED:none   DRAINS:  Incisional wound VAC     LOCAL MEDICATIONS USED:  MARCAINE    and OTHER Exparel and morphine   SPECIMEN:  No Specimen   DISPOSITION OF SPECIMEN:  N/A   COUNTS:  YES   TOURNIQUET: 69 minutes at 300 mm Hg   IMPLANTS: Medacta  GMK sphere system with 5 right femur, 4 right tibia with short stem and 10 mm insert.  Size 2 patella, all components cemented.   DICTATION: Viviann Spare Dictation   patient was brought to the operating room and spinal anesthesia was obtained.  After prepping and draping the right leg in sterile fashion, and after patient identification and timeout procedures were completed, tourniquet was raised  and midline skin incision was made followed by medial parapatellar arthrotomy with severe medial compartment osteoarthritis, severe patellofemoral arthritis and severe erosion was loss of bone lateral compartment arthritis, partial synovectomy was also carried out.   The ACL and PCL and fat pad were excised along with anterior horns of the meniscus. The proximal tibia cutting guide from  the extra medullary system was applied and the proximal tibia cut carried out.  The distal femoral cut was carried out in a similar fashion with intramedullary device     the 5 femoral cutting guide applied after measuring and placing positioning pins with anterior posterior and chamfer cuts made.  The posterior horns of the menisci were removed at this point.   Injection of the above medication was carried out after the femoral and tibial cuts were carried out.  The 4 baseplate trial was placed pinned into position and proximal  tibial preparation carried out with drilling hand reaming and the keel punch followed by placement of the 5 femur and sizing the tibial insert size 10 millimeter gave the best fit with stability and full extension.  The distal femoral drill holes were made in the notch cut for the trochlear groove was then carried out with trials were then removed the patella was cut using the patellar cutting guide and it sized to a size 2 after drill holes have been made  The knee was irrigated with pulsatile lavage and the bony surfaces dried the tibial component was cemented into place first.  Excess cement was removed and the polyethylene insert placed with a torque screw placed with a torque screwdriver tightened.  The distal femoral component was placed and the knee was held in extension as the patellar button was clamped into place.  After the cement was set, excess cement was removed and the knee was again irrigated thoroughly thoroughly irrigated.  The tourniquet was let down and hemostasis checked with electrocautery. The arthrotomy was repaired with a heavy Quill suture,  followed by 3-0 V lock subcuticular closure, skin staples followed by incisional wound VAC and Polar Care.Marland Kitchen   PLAN OF CARE: Admit to inpatient    PATIENT DISPOSITION:  PACU - hemodynamically stable.

## 2020-10-25 NOTE — Evaluation (Signed)
Occupational Therapy Evaluation Patient Details Name: Sarah Marquez MRN: [id number] DOB: 05-24-1955 Today's Date: 10/25/2020    History of Present Illness 65 female s/p Right TKA (6/28). PMH: Anemia, Bilateral carpal tunnel syndrome, GERD (gastroesophageal reflux disease), Hyperlipidemia, Hypertension, Osteoarthritis   Clinical Impression   Pt seen for OT evaluation this date, POD#0 from above surgery. Prior to surgery, pt was independent with ADLs/IADLs and functional mobility. Pt does not use adaptive equipment at baseline although has access to son's equipment (see below). Pt drives and is retired. Pt denies any falls within the past 6 months. Pt is eager to return to PLOF with less pain and improved safety and independence. Pt currently requires MAX A for LB dressing while in seated position, MIN A for bed mobility, MIN A for sit<>stand transfers, and MIN GUARD for functional mobility of short household distances (~27ft) with RW due to pain and limited AROM of R knee. Pt instructed in polar care mgt, falls prevention strategies, home/routines modifications, DME/AE for LB bathing and dressing tasks; handout provided. Pt would benefit from additional skilled OT services including additional instruction in techniques, with or without assistive devices, for dressing and bathing skills to support recall and carryover prior to discharge and ultimately to maximize safety, independence, and minimize falls risk and caregiver burden. Upon discharge, recommend home health OT and supervision/assistance from family with OOB mobility.    Follow Up Recommendations  Home health OT;Supervision - Intermittent    Equipment Recommendations  3 in 1 bedside commode       Precautions / Restrictions Precautions Precautions: Fall Restrictions Weight Bearing Restrictions: Yes RLE Weight Bearing: Weight bearing as tolerated      Mobility Bed Mobility Overal bed mobility: Needs Assistance Bed Mobility: Supine  to Sit;Sit to Supine     Supine to sit: Min assist;HOB elevated Sit to supine: Min assist;HOB elevated   General bed mobility comments: MIN A for RLE management    Transfers Overall transfer level: Needs assistance   Transfers: Sit to/from Stand Sit to Stand: Min assist         General transfer comment: MIN A for initial momentum. x2 bouts    Balance Overall balance assessment: Needs assistance Sitting-balance support: No upper extremity supported;Feet supported Sitting balance-Leahy Scale: Good Sitting balance - Comments: Good balance sitting EOB while reaching outside BOS to attempt to adjust socks   Standing balance support: Bilateral upper extremity supported;During functional activity Standing balance-Leahy Scale: Poor Standing balance comment: requires MIN GUARD and b/l UE support on RW during functional mobility of short household distances (~13ft)                           ADL either performed or assessed with clinical judgement   ADL Overall ADL's : Needs assistance/impaired     Grooming: Wash/dry hands;Min guard;Standing               Lower Body Dressing: Maximal assistance;Sitting/lateral leans Lower Body Dressing Details (indicate cue type and reason): to adjust socks while sitting at EOB             Functional mobility during ADLs: Min guard;Rolling walker       Vision Baseline Vision/History: Wears glasses Wears Glasses: At all times Patient Visual Report: No change from baseline              Pertinent Vitals/Pain Pain Assessment: 0-10 Pain Score: 6  Pain Location: R knee during mobility Pain Descriptors /  Indicators: Aching Pain Intervention(s): Limited activity within patient's tolerance;Monitored during session        Extremity/Trunk Assessment Upper Extremity Assessment Upper Extremity Assessment: Overall WFL for tasks assessed   Lower Extremity Assessment Lower Extremity Assessment: Defer to PT evaluation        Communication Communication Communication: No difficulties   Cognition Arousal/Alertness: Awake/alert Behavior During Therapy: WFL for tasks assessed/performed Overall Cognitive Status: Within Functional Limits for tasks assessed                                 General Comments: Pleasant and agreeable throughout   General Comments  Pt denies any dizziness with change in position. Following supine>sit transfer BP 123/66, HR 66, SpO2 97 (on RA)    Exercises Other Exercises Other Exercises: Pt instructed in polar care mgt, falls prevention strategies, home/routines modifications, DME/AE for LB bathing and dressing tasks; handout provided.   Shoulder Instructions      Home Living Family/patient expects to be discharged to:: Private residence Living Arrangements: Spouse/significant other;Children (lives with adult child (24 yo) who has muscular dystrophy) Available Help at Discharge: Family;Available 24 hours/day Type of Home: House Home Access: Ramped entrance     Home Layout: Laundry or work area in basement;Able to live on main level with bedroom/bathroom     Bathroom Shower/Tub: Walk-in shower         Home Equipment: [place] held shower head;Grab bars - tub/shower;Adaptive equipment Adaptive Equipment: [place]        Prior Functioning/Environment Level of Independence: Independent        Comments: Pt was independent with ADLs/IADLs and functional mobility. Pt does not use adaptive equipment at baseline although has access to son's equipment. Pt drives and is retired        Secretary/administrator Problem List: Impaired balance (sitting and/or standing);Pain;Decreased knowledge of use of DME or AE      OT Treatment/Interventions: Self-care/ADL training;Therapeutic exercise;DME and/or AE instruction;Therapeutic activities;Patient/family education;Balance training    OT Goals(Current goals can be found in the care plan section) Acute Rehab OT Goals Patient  Stated Goal: to regain independence OT Goal Formulation: With patient Time For Goal Achievement: 11/08/20 Potential to Achieve Goals: Good  OT Frequency: Min 1X/week    AM-PAC OT "6 Clicks" Daily Activity     Outcome Measure Help from another person eating meals?: None Help from another person taking care of personal grooming?: A Little Help from another person toileting, which includes using toliet, bedpan, or urinal?: A Little Help from another person bathing (including washing, rinsing, drying)?: A Lot Help from another person to put on and taking off regular upper body clothing?: None Help from another person to put on and taking off regular lower body clothing?: A Lot 6 Click Score: 18   End of Session Equipment Utilized During Treatment: Gait belt;Rolling walker Nurse Communication: Mobility status  Activity Tolerance: Patient tolerated treatment well Patient left: in bed;with call bell/phone within reach;with bed alarm set;with family/visitor present  OT Visit Diagnosis: Unsteadiness on feet (R26.81)                Time: 8119-1478 OT Time Calculation (min): 43 min Charges:  OT General Charges $OT Visit: 1 Visit OT Evaluation $OT Eval Moderate Complexity: 1 Mod OT Treatments $Self Care/Home Management : 23-37 mins  Fredirick Maudlin, OTR/L [place]

## 2020-10-25 NOTE — H&P (Signed)
Chief Complaint  Patient presents with   Pre-op Exam  Right TKA scheduled 10/25/20 with Dr. Rudene Christians    History of the Present Illness: Sarah Marquez is a 65 y.o. female here today for history and physical for right total knee arthroplasty with Dr. Hessie Knows on 10/25/2020. Patient has severe tricompartmental osteoarthritis with near complete loss of joint space in the medial and lateral compartment with severe patellofemoral spurring. She has been treated for years with cortisone injections and viscosupplementation with initially good relief from the last couple years relief has been declining. Pain is interfering with quality of life and activities daily living. She describes generalized aching pain moderate to severe throughout the knee the source of weightbearing activity. Her knee will buckle and give way. She is tried over-the-counter medications with no relief.  The patient is retired. No history of blood clots.  I have reviewed past medical, surgical, social and family history, and allergies as documented in the EMR.  Past Medical History: Past Medical History:  Diagnosis Date   Anemia  IDA   Bilateral carpal tunnel syndrome  Chronic.   Blood in stool   Colon polyp 2014  adenomatous   GERD (gastroesophageal reflux disease)   Hyperlipidemia   Hypertension   IDA (iron deficiency anemia)   Menopause   Metabolic syndrome   Osteoarthritis   Recurrent sinusitis   Stomach ulcer 2014   Past Surgical History: Past Surgical History:  Procedure Laterality Date   ABDOMINAL HYSTERECTOMY   APPENDECTOMY   back surgery   CHOLECYSTECTOMY   COLONOSCOPY 08/16/2004  Adenomatous Polyp   COLONOSCOPY 10/17/2007  PH Adenomatous Polyp   COLONOSCOPY 06/20/2012  Adenomatous Polyp: CBF 05/2017   COLONOSCOPY 10/16/2017  PH Adenomatous Polyp   EGD 08/16/2004   EGD 06/20/2012  No repeat per RTE   EGD 10/23/2013  No repeat per RTE   EGD 10/16/2017  Gastritis: No repeat per RTE    FUNCTIONAL ENDOSCOPIC SINUS SURGERY   KNEE ARTHROSCOPY Right 11/30/2002  Right knee arthroscopy with partial medial and lateral meniscectomies.   ruptured disk surgery   Past Family History: Family History  Problem Relation Age of Onset   Cancer Mother   Myocardial Infarction (Heart attack) Father   Myocardial Infarction (Heart attack) Brother   Hepatitis Brother   Liver disease Brother   Medications: Current Outpatient Medications Ordered in Epic  Medication Sig Dispense Refill   beclomethasone dipropionate (QVAR REDIHALER HFA) 80 mcg/actuation inhaler Inhale into the lungs   cholecalciferol (VITAMIN D3) 1000 unit tablet Take by mouth once daily.   ergocalciferol, vitamin D2, 1,250 mcg (50,000 unit) capsule Take 1 capsule by mouth once a week   fluticasone (FLONASE) 50 mcg/actuation nasal spray as needed.   losartan (COZAAR) 50 MG tablet Take 50 mg by mouth once daily. 12   magnesium 200 mg Take 1 tablet by mouth 2 (two) times daily.   mometasone (ELOCON) 0.1 % ointment APPLY THIN FILM TO AFFECTED AREAS TWICE DAILY UNTIL CLEAR, THEN AS NEEDED   montelukast (SINGULAIR) 10 mg tablet Take 1 tablet by mouth once daily 10   naproxen sodium (ALEVE) 220 MG tablet Take by mouth   pantoprazole (PROTONIX) 40 MG DR tablet Take 1 tablet (40 mg total) by mouth 2 (two) times daily before meals 180 tablet 3   QVAR REDIHALER 80 mcg/actuation inhaler TAKE TWO PUFFS TWICE DAILY   rosuvastatin (CRESTOR) 10 MG tablet Take 10 mg by mouth once daily   triamcinolone 0.1 % ointment APPLY  TO AFFECTED AREA TWICE A DAY AS NEEDED   verapamil (CALAN-SR) 240 MG SR tablet Take 1 tablet by mouth once daily.   No current Epic-ordered facility-administered medications on file.   Allergies: Allergies  Allergen Reactions   Beconase Aq [Beclomethasone Diprop (Aq)] Swelling  Facial   Latex Rash   Meloxicam Palpitations   Sulfa (Sulfonamide Antibiotics) Rash    Body mass index is 46.59 kg/m.  Review of  Systems: A comprehensive 14 point ROS was performed, reviewed, and the pertinent orthopaedic findings are documented in the HPI.  Vitals:  10/12/20 0956  BP: (!) 140/70    General Physical Examination:   General:  Well developed, well nourished, no apparent distress, normal affect, normal gait with no antalgic component.   HEENT: Head normocephalic, atraumatic, PERRL.   Abdomen: Soft, non tender, non distended, Bowel sounds present.  Heart: Examination of the heart reveals regular, rate, and rhythm. There is no murmur noted on ascultation. There is a normal apical pulse.  Lungs: Lungs are clear to auscultation. There is no wheeze, rhonchi, or crackles. There is normal expansion of bilateral chest walls.   Musculoskeletal Examination:  On exam, right knee range of motion of 5-100 degrees. Good distal pulses. Right knee crepitus. Knee stable to valgus and varus stress testing. No laxity. Patient has some patellofemoral crepitus. She is able to straight leg raise. Normal hip internal X rotation with no discomfort.  Radiographs:  AP, lateral, standing, and sunrise x-rays of the right knee were reviewed by me today. These show severe tricompartmental degenerative change with valgus deformity, large osteophytes posteriorly and anteriorly with extensive erosion of tibial and femoral bone. Sunrise view shows severe patellofemoral degenerative change with bone-on-bone osteoarthritis, extensive subchondral cyst formation.  X-ray Impression Severe tricompartmental osteoarthritis of the right knee.  Assessment: ICD-10-CM  1. Primary osteoarthritis of right knee M17.11   Plan: 68. 65 year old female with advanced right knee osteoarthritis with complete loss of joint space in the medial lateral compartment. She has severe patellofemoral degenerative changes and spurring. She is tried conservative treatment for years, initially getting relief but most recently no improvement. Pain is  interfering with quality life and activities daily living. Risks, benefits, complications of a right total knee arthroplasty been discussed with the patient. Patient has agreed and consented procedure of Dr. Hessie Knows on 10/25/2020.   Electronically signed by Feliberto Gottron, [place] at 10/12/2020 10:19 AM EDT   Reviewed  H+P. No changes noted.

## 2020-10-25 NOTE — Evaluation (Signed)
Physical Therapy Evaluation Patient Details Name: Sarah Marquez MRN: [id number] DOB: [birth year] Today's Date: 10/25/2020   History of Present Illness  Pt is a 78 female s/p elective right TKA (6/28). PMH includes: Anemia, bilateral carpal tunnel syndrome, GERD (gastroesophageal reflux disease), Hyperlipidemia, Hypertension, skin CA, and osteoarthritis.   Clinical Impression  Pt was pleasant and motivated to participate during the session and overall performed very well especially considering POD#0 status.  Pt required no physical assistance during the session and was able to take several steps near the EOB and to the chair without LOB or buckling.  Pt's SpO2 and HR were both WNL on room air during the session with no adverse symptoms reported by the pt other than mod R knee pain.  Pt will benefit from HHPT upon discharge to safely address deficits listed in patient problem list for decreased caregiver assistance and eventual return to PLOF.      Follow Up Recommendations Home health PT;Supervision for mobility/OOB    Equipment Recommendations  Rolling walker with 5" wheels;3in1 (PT)    Recommendations for Other Services       Precautions / Restrictions Precautions Precautions: Fall Restrictions Weight Bearing Restrictions: Yes RLE Weight Bearing: Weight bearing as tolerated      Mobility  Bed Mobility Overal bed mobility: Modified Independent Bed Mobility: Supine to Sit;Sit to Supine     Supine to sit: Min assist;HOB elevated Sit to supine: Min assist;HOB elevated   General bed mobility comments: Extra time and effort with sup to sit but no physical assistance required    Transfers Overall transfer level: Needs assistance Equipment used: Rolling walker (2 wheeled) Transfers: Sit to/from Stand Sit to Stand: Min guard         General transfer comment: Min to mod verbal and visual cues for R foot and BUE placement; fair eccentric and concentric  control  Ambulation/Gait Ambulation/Gait assistance: Min guard Gait Distance (Feet): 5 Feet Assistive device: Rolling walker (2 wheeled) Gait Pattern/deviations: Step-to pattern;Decreased stance time - right;Antalgic Gait velocity: decreased   General Gait Details: Antalgic on the RLE but steady without LOB or buckling  Stairs            Wheelchair Mobility    Modified Rankin (Stroke Patients Only)       Balance Overall balance assessment: Needs assistance Sitting-balance support: No upper extremity supported;Feet supported Sitting balance-Leahy Scale: Good Sitting balance - Comments: Good balance sitting EOB while reaching outside BOS to attempt to adjust socks   Standing balance support: Bilateral upper extremity supported;During functional activity Standing balance-Leahy Scale: Fair Standing balance comment: Min lean on the RW for support but steady without LOB                             Pertinent Vitals/Pain Pain Assessment: 0-10 Pain Score: 7  Pain Location: R knee Pain Descriptors / Indicators: Aching;Sore Pain Intervention(s): Premedicated before session;Monitored during session;Repositioned;Ice applied    Home Living Family/patient expects to be discharged to:: Private residence Living Arrangements: Spouse/significant other;Children Available Help at Discharge: Family;Available 24 hours/day Type of Home: House Home Access: Ramped entrance     Home Layout: Laundry or work area in basement;Able to live on main level with bedroom/bathroom Home Equipment: Holly held shower head;Grab bars - tub/shower;Adaptive equipment;Cane - single point;Crutches      Prior Function Level of Independence: Independent         Comments: Ind amb community distances, no  fall history, Ind with ADLs, drives     Hand Dominance        Extremity/Trunk Assessment   Upper Extremity Assessment Upper Extremity Assessment: Overall WFL for tasks  assessed    Lower Extremity Assessment Lower Extremity Assessment: Generalized weakness;RLE deficits/detail RLE Deficits / Details: BLE ankle strength and AROM WNL RLE: Unable to fully assess due to pain       Communication   Communication: No difficulties  Cognition Arousal/Alertness: Awake/alert Behavior During Therapy: WFL for tasks assessed/performed Overall Cognitive Status: Within Functional Limits for tasks assessed                                 General Comments: Pleasant and agreeable throughout      General Comments General comments (skin integrity, edema, etc.): Pt denies any dizziness with change in position. Following supine>sit transfer BP 123/66, HR 66, SpO2 97 (on RA)    Exercises Total Joint Exercises Ankle Circles/Pumps: AROM;Strengthening;Both;10 reps Quad Sets: AROM;Strengthening;Right;5 reps;10 reps Long Arc Quad: AROM;Strengthening;Right;5 reps;10 reps Knee Flexion: AROM;Strengthening;Right;5 reps;10 reps Goniometric ROM: R knee AROM: 0-79 deg Marching in Standing: AROM;Strengthening;Both;5 reps;Standing Other Exercises Other Exercises: Positioning education with pt and spouse to promote R knee ext PROM and prevent heel pressure Other Exercises: HEP education per handout   Assessment/Plan    PT Assessment Patient needs continued PT services  PT Problem List Decreased strength;Decreased range of motion;Decreased activity tolerance;Decreased balance;Decreased mobility;Decreased knowledge of use of DME;Pain       PT Treatment Interventions DME instruction;Gait training;Stair training;Functional mobility training;Therapeutic activities;Therapeutic exercise;Balance training;Patient/family education    PT Goals (Current goals can be found in the Care Plan section)  Acute Rehab PT Goals Patient Stated Goal: To walk without pain PT Goal Formulation: With patient Time For Goal Achievement: 11/07/20 Potential to Achieve Goals: Good     Frequency BID   Barriers to discharge        Co-evaluation               AM-PAC PT "6 Clicks" Mobility  Outcome Measure Help needed turning from your back to your side while in a flat bed without using bedrails?: A Little Help needed moving from lying on your back to sitting on the side of a flat bed without using bedrails?: A Little Help needed moving to and from a bed to a chair (including a wheelchair)?: A Little Help needed standing up from a chair using your arms (e.g., wheelchair or bedside chair)?: A Little Help needed to walk in hospital room?: A Little Help needed climbing 3-5 steps with a railing? : A Lot 6 Click Score: 17    End of Session Equipment Utilized During Treatment: Gait belt Activity Tolerance: Patient tolerated treatment well Patient left: in chair;with call bell/phone within reach;with chair alarm set;with SCD's reapplied;with family/visitor present;Other (comment) (Polar care to R knee) Nurse Communication: Mobility status PT Visit Diagnosis: Other abnormalities of gait and mobility (R26.89);Muscle weakness (generalized) (M62.81);Pain Pain - Right/Left: Right Pain - part of body: Knee    Time: 4403-4742 PT Time Calculation (min) (ACUTE ONLY): 42 min   Charges:   PT Evaluation $PT Eval Moderate Complexity: 1 Mod PT Treatments $Therapeutic Exercise: 8-22 mins $Therapeutic Activity: 8-22 mins        D. Scott Wylee Dorantes PT, DPT 10/25/20, 5:20 PM

## 2020-10-26 ENCOUNTER — Encounter: Payer: Self-pay | Admitting: Orthopedic Surgery

## 2020-10-26 LAB — CBC
HCT: 34.8 % — ABNORMAL LOW (ref 36.0–46.0)
Hemoglobin: 11.6 g/dL — ABNORMAL LOW (ref 12.0–15.0)
MCH: 27.6 pg (ref 26.0–34.0)
MCHC: 33.3 g/dL (ref 30.0–36.0)
MCV: 82.9 fL (ref 80.0–100.0)
Platelets: 173 10*3/uL (ref 150–400)
RBC: 4.2 MIL/uL (ref 3.87–5.11)
RDW: 14.2 % (ref 11.5–15.5)
WBC: 9.5 10*3/uL (ref 4.0–10.5)
nRBC: 0 % (ref 0.0–0.2)

## 2020-10-26 LAB — BASIC METABOLIC PANEL
Anion gap: 4 — ABNORMAL LOW (ref 5–15)
BUN: 18 mg/dL (ref 8–23)
CO2: 27 mmol/L (ref 22–32)
Calcium: 8.7 mg/dL — ABNORMAL LOW (ref 8.9–10.3)
Chloride: 107 mmol/L (ref 98–111)
Creatinine, Ser: 0.9 mg/dL (ref 0.44–1.00)
GFR, Estimated: >60 mL/min (ref >60)
Glucose, Bld: 144 mg/dL — ABNORMAL HIGH (ref 70–99)
Potassium: 4.6 mmol/L (ref 3.5–5.1)
Sodium: 138 mmol/L (ref 135–145)

## 2020-10-26 MED ORDER — METHOCARBAMOL 500 MG PO TABS: 500.0000 mg | ORAL_TABLET | Freq: Four times a day (QID) | ORAL | 0 refills | Status: DC | PRN

## 2020-10-26 MED ORDER — ENOXAPARIN SODIUM 40 MG/0.4ML IJ SOSY: 40.0000 mg | PREFILLED_SYRINGE | INTRAMUSCULAR | 0 refills | Status: DC

## 2020-10-26 MED ORDER — DOCUSATE SODIUM 100 MG PO CAPS: 100.0000 mg | ORAL_CAPSULE | Freq: Two times a day (BID) | ORAL | 0 refills | Status: DC

## 2020-10-26 MED ORDER — TRAMADOL HCL 50 MG PO TABS: 50.0000 mg | ORAL_TABLET | Freq: Four times a day (QID) | ORAL | 0 refills | Status: DC | PRN

## 2020-10-26 MED ORDER — HYDROCODONE-ACETAMINOPHEN 7.5-325 MG PO TABS: 1.0000 | ORAL_TABLET | ORAL | 0 refills | Status: DC | PRN

## 2020-10-26 NOTE — Progress Notes (Signed)
MD approved pt's husband to go home with prescriptions to get them filled. Sent pt home with 4 scripts on 10/26/20 for him to fill.

## 2020-10-26 NOTE — Discharge Summary (Signed)
Physician Discharge Summary  Patient ID: Sarah Marquez MRN: [id number] DOB/AGE: 67/03/1956 67 y.o.  Admit date: 10/25/2020 Discharge date: 10/27/2020  Admission Diagnoses:  S/P TKR (total knee replacement) using cement, right [Z96.651]   Discharge Diagnoses: Patient Active Problem List   Diagnosis Date Noted   S/P TKR (total knee replacement) using cement, right 10/25/2020   Right hip pain 02/26/2020   Calcification of right carotid artery- on panoramic x ray 09/2019 02/26/2020   Urinary tract infection without hematuria 02/18/2020   Cough variant asthma 12/27/2015   Dyspnea 11/14/2015   Recurrent URI (upper respiratory infection) 11/10/2015   Cough 11/10/2015   Benign neoplasm of colon 01/19/2015   Allergic rhinitis 01/19/2015   Absolute anemia 01/19/2015   Colon polyp 01/19/2015   Ankle bruise 63/84/6659   Dysmetabolic syndrome X 93/57/0177   Disorder of esophagus 01/19/2015   Edema 01/19/2015   Esophagitis, reflux 01/19/2015   Frontal sinusitis 01/19/2015   BP (high blood pressure) 01/19/2015   Anemia, iron deficiency 01/19/2015   Iron deficiency anemia 01/19/2015   Abnormal kidney function 01/19/2015   Cervical pain 01/19/2015   Dyspnea, paroxysmal nocturnal 01/19/2015   Gastric ulcer 01/19/2015   Hive 01/19/2015   B12 deficiency 01/19/2015   Vitamin D deficiency 01/19/2015   Degenerative arthritis of finger 06/07/2014   Benign paroxysmal positional nystagmus 03/09/2009   HLD (hyperlipidemia) 11/03/2008   Fam hx-ischem heart disease 06/26/2008   History of tobacco use 06/26/2008   Essential (primary) hypertension 06/03/2008   Extreme obesity 06/03/2008   Apnea, sleep 06/03/2008   Morbid obesity ([place]) 06/03/2008   H/O total hysterectomy 04/30/1998    Past Medical History:  Diagnosis Date   Arthritis    neck, knees   Asthma    Cancer ([place])    skin, basal cell removed from neck, back and foot   Carpal tunnel syndrome    Colon polyps    Difficult  intubation    pt unaware   Gastric ulcer    GERD (gastroesophageal reflux disease)    on dexilant   Gout    Hyperlipidemia    Hypertension    controlled on meds   Iron deficiency anemia    Sleep apnea    does not use cpap   Vertigo    last episode 12/2018   Wears dentures    partial upper     Transfusion: none   Consultants (if any):   Discharged Condition: Improved  Hospital Course: SHAWNAE LEIVA is an 64 y.o. female who was admitted 10/25/2020 with a diagnosis of right knee osteoarthirits and went to the operating room on 10/25/2020 and underwent the above named procedures.    Surgeries: Procedure(s): TOTAL KNEE ARTHROPLASTY - Rachelle Hora to assist on 10/25/2020 Patient tolerated the surgery well. Taken to PACU where she was stabilized and then transferred to the orthopedic floor.  Started on Lovenox 30 mg q 12 hrs. Foot pumps applied bilaterally at 80 mm. Heels elevated on bed with rolled towels. No evidence of DVT. Negative Homan. Physical therapy started on day #1 for gait training and transfer. OT started day #1 for ADL and assisted devices.  The patient ambulated 160 feet with physical therapy  Patient's foley was d/c on day #1. Patient's IV  was d/c on day #2.  On post op day #2 patient was stable and ready for discharge to home with HHPT.    She was given perioperative antibiotics:  Anti-infectives (From admission, onward)    Start  Dose/Rate Route Frequency Ordered Stop   10/25/20 1400  ceFAZolin (ANCEF) IVPB 3g/100 mL premix        3 g 200 mL/hr over 30 Minutes Intravenous Every 8 hours 10/25/20 1032 10/25/20 2220   10/25/20 0600  ceFAZolin (ANCEF) IVPB 3g/100 mL premix        3 g 200 mL/hr over 30 Minutes Intravenous On call to O.R. 10/25/20 0225 10/25/20 0748     .  She was given sequential compression devices, early ambulation, and lovenox TEDs for DVT prophylaxis.  She benefited maximally from the hospital stay and there were no complications.     Recent vital signs:  Vitals:   10/26/20 1712 10/26/20 2103  BP: 120/72 (!) 113/52  Pulse: (!) 58 71  Resp: 16 18  Temp: 98 F (36.7 C) 98.5 F (36.9 C)  SpO2: 96% 95%    Recent laboratory studies:  Lab Results  Component Value Date   HGB 11.6 (L) 10/26/2020   HGB 11.9 (L) 10/25/2020   HGB 13.3 10/14/2020   Lab Results  Component Value Date   WBC 9.5 10/26/2020   PLT 173 10/26/2020   No results found for: INR Lab Results  Component Value Date   NA 138 10/26/2020   K 4.6 10/26/2020   CL 107 10/26/2020   CO2 27 10/26/2020   BUN 18 10/26/2020   CREATININE 0.90 10/26/2020   GLUCOSE 144 (H) 10/26/2020    Discharge Medications:   Allergies as of 10/27/2020       Reactions   Albuterol    Nebulizer form, causes wheezing   Beclomethasone Swelling   Beconase Nose spray swelled face up Currently uses Qvar on occasion and it does not cause problems   Sulfa Antibiotics Hives   Latex Rash   Adhesives, bandaids Paper tape is okay   Meloxicam Palpitations        Medication List     STOP taking these medications    cyclobenzaprine 10 MG tablet Commonly known as: FLEXERIL   naproxen sodium 220 MG tablet Commonly known as: ALEVE   predniSONE 10 MG (21) Tbpk tablet Commonly known as: STERAPRED UNI-PAK 21 TAB       TAKE these medications    beclomethasone 80 MCG/ACT inhaler Commonly known as: QVAR Inhale 2 puffs into the lungs 2 (two) times daily as needed (shortness of breath).   docusate sodium 100 MG capsule Commonly known as: COLACE Take 1 capsule (100 mg total) by mouth 2 (two) times daily.   doxepin 10 MG capsule Commonly known as: SINEQUAN Take 10 mg by mouth daily as needed (hives).   enoxaparin 40 MG/0.4ML injection Commonly known as: LOVENOX Inject 0.4 mLs (40 mg total) into the skin daily for 14 days.   HYDROcodone-acetaminophen 7.5-325 MG tablet Commonly known as: NORCO Take 1-2 tablets by mouth every 4 (four) hours as needed for  severe pain (pain score 7-10).   meclizine 25 MG tablet Commonly known as: ANTIVERT Take 1 tablet (25 mg total) by mouth 3 (three) times daily as needed for dizziness.   methocarbamol 500 MG tablet Commonly known as: ROBAXIN Take 1 tablet (500 mg total) by mouth every 6 (six) hours as needed for muscle spasms.   montelukast 10 MG tablet Commonly known as: SINGULAIR TAKE 1 TABLET (10 MG TOTAL) BY MOUTH DAILY.   NON FORMULARY CPAP at night   pantoprazole 40 MG tablet Commonly known as: PROTONIX Take 40 mg by mouth 2 (two) times daily.   rosuvastatin  10 MG tablet Commonly known as: Crestor Take 1 tablet (10 mg total) by mouth daily.   traMADol 50 MG tablet Commonly known as: ULTRAM Take 1 tablet (50 mg total) by mouth every 6 (six) hours as needed.   Vitamin D (Ergocalciferol) 1.25 MG (50000 UNIT) Caps capsule Commonly known as: DRISDOL TAKE 1 CAPSULE (50,000 UNITS TOTAL) BY MOUTH EVERY 7 (SEVEN) DAYS. (TAKING ONE TABLET PER WEEK)       ASK your doctor about these medications    losartan 50 MG tablet Commonly known as: COZAAR TAKE 1 TABLET BY MOUTH EVERY DAY   verapamil 240 MG CR tablet Commonly known as: CALAN-SR TAKE 1 TABLET BY MOUTH EVERY DAY               Durable Medical Equipment  (From admission, onward)           Start     Ordered   10/25/20 1032  DME Walker rolling  Once       Question Answer Comment  Walker: With 5 Inch Wheels   Patient needs a walker to treat with the following condition S/P TKR (total knee replacement) using cement, right      10/25/20 1031   10/25/20 1032  DME 3 n 1  Once        10/25/20 1031   10/25/20 1032  DME Bedside commode  Once       Question:  Patient needs a bedside commode to treat with the following condition  Answer:  S/P TKR (total knee replacement) using cement, right   10/25/20 1031            Diagnostic Studies: DG Knee 1-2 Views Right  Result Date: 10/25/2020 CLINICAL DATA:  Postop knee  replacement. EXAM: RIGHT KNEE - 1-2 VIEW COMPARISON:  None. FINDINGS: Sequelae of right total knee arthroplasty are identified. The prosthetic components appear normally located. No acute fracture is identified. Postoperative gas is noted in the knee joint and surrounding soft tissues, and skin staples are in place. There is generalized soft tissue swelling about the knee. IMPRESSION: Right total knee arthroplasty without evidence of acute complication. Electronically Signed   By: Logan Bores M.D.   On: 10/25/2020 10:08    Disposition: Discharge disposition: 01-Home or Self Care         Follow-up Information     Duanne Guess, PA-C Follow up in 2 week(s).   Specialties: Orthopedic Surgery, Emergency Medicine Contact information: [place] [address] [phone]                  Signed: Reche Dixon 10/27/2020, 6:28 AM

## 2020-10-26 NOTE — Anesthesia Postprocedure Evaluation (Signed)
Anesthesia Post Note  Patient: JANILA ARRAZOLA  Procedure(s) Performed: TOTAL KNEE ARTHROPLASTY - Rachelle Hora to assist (Right: Knee)  Patient location during evaluation: Nursing Unit Anesthesia Type: Spinal Level of consciousness: awake, oriented and awake and alert Pain management: pain level controlled Vital Signs Assessment: post-procedure vital signs reviewed and stable Respiratory status: spontaneous breathing, respiratory function stable and nonlabored ventilation Cardiovascular status: blood pressure returned to baseline and stable Postop Assessment: no headache and no backache Anesthetic complications: no   No notable events documented.   Last Vitals:  Vitals:   10/26/20 0544 10/26/20 0736  BP: (!) 158/68 (!) 157/67  Pulse: 85 73  Resp: 16 18  Temp: 36.8 C 36.6 C  SpO2: 97% 96%    Last Pain:  Vitals:   10/26/20 0736  TempSrc: Oral  PainSc:                  Johnna Acosta

## 2020-10-26 NOTE — Progress Notes (Signed)
Occupational Therapy Treatment Patient Details Name: Sarah Marquez MRN: [id number] DOB: [birth year] Today's Date: 10/26/2020    History of present illness Pt is a 16 female s/p elective right TKA (6/28). PMH includes: Anemia, bilateral carpal tunnel syndrome, GERD (gastroesophageal reflux disease), Hyperlipidemia, Hypertension, skin CA, and osteoarthritis.   OT comments  Pt seen for OT treatment on this date. Upon arrival to room, pt finishing session with PT and agreeable to OT tx. Pt required MIN GUARD for BSC transfer, MIN GUARD for seated peri-care, MIN GUARD for standing hand hygiene, and SUPERVISION for sit>supine transfer with use of bedrails. Pt was able to verbalize understanding of polar care management education provided and is making good progress toward goals. Pt continues to benefit from skilled OT services to maximize return to PLOF and minimize risk of future falls, injury, caregiver burden, and readmission. Will continue to follow POC. Discharge recommendation remains appropriate.     Follow Up Recommendations  Home health OT;Supervision - Intermittent    Equipment Recommendations  3 in 1 bedside commode       Precautions / Restrictions Precautions Precautions: Fall Restrictions Weight Bearing Restrictions: Yes RLE Weight Bearing: Weight bearing as tolerated       Mobility Bed Mobility Overal bed mobility: Modified Independent Bed Mobility: Sit to Supine       Sit to supine: Supervision   General bed mobility comments: with use of bedrails, pt able to perform sit>supine transfer with supervision only    Transfers Overall transfer level: Needs assistance Equipment used: Rolling walker (2 wheeled) Transfers: Sit to/from Stand Sit to Stand: Min guard         General transfer comment: CGA for safety    Balance Overall balance assessment: Needs assistance Sitting-balance support: No upper extremity supported;Feet supported Sitting balance-Leahy Scale:  Good Sitting balance - Comments: Good dyanamic sitting balance while performing peri-care   Standing balance support: Bilateral upper extremity supported;During functional activity Standing balance-Leahy Scale: Fair Standing balance comment: no balance deficits with BUE support on RW during static and dynamic activity                           ADL either performed or assessed with clinical judgement   ADL Overall ADL's : Needs assistance/impaired     Grooming: Wash/dry hands;Min Dispensing optician: Min guard;Ambulation;BSC;RW   Toileting- Clothing Manipulation and Hygiene: Min guard;Sitting/lateral lean       Functional mobility during ADLs: Min guard;Rolling walker        Cognition Arousal/Alertness: Awake/alert Behavior During Therapy: WFL for tasks assessed/performed Overall Cognitive Status: Within Functional Limits for tasks assessed                                 General Comments: Pleasant and agreeable throughout. Able to recall polar care management education provided yesterday        Exercises Exercises: Total Joint (Discussed importance of positioning with pt/pt's spouse. continued use of polar care for swelling reduction, and importance of performing ther ex handout to promote strengthening. demonstarted ~ 80 degrees flex but lacking 3 degrees extension.)           Pertinent Vitals/ Pain       Pain Assessment: 0-10 Pain Score: 2  Pain Location: R knee Pain Descriptors /  Indicators: Aching;Sore Pain Intervention(s): Monitored during session;Limited activity within patient's tolerance;Premedicated before session;Repositioned         Frequency  Min 1X/week        Progress Toward Goals  OT Goals(current goals can now be found in the care plan section)  Progress towards OT goals: Progressing toward goals  Acute Rehab OT Goals Patient Stated Goal: go home OT Goal Formulation: With  patient Time For Goal Achievement: 11/08/20 Potential to Achieve Goals: Good  Plan Discharge plan remains appropriate;Frequency remains appropriate       AM-PAC OT "6 Clicks" Daily Activity     Outcome Measure   Help from another person eating meals?: None Help from another person taking care of personal grooming?: A Little Help from another person toileting, which includes using toliet, bedpan, or urinal?: A Little Help from another person bathing (including washing, rinsing, drying)?: A Lot Help from another person to put on and taking off regular upper body clothing?: None Help from another person to put on and taking off regular lower body clothing?: A Lot 6 Click Score: 18    End of Session Equipment Utilized During Treatment: Gait belt;Rolling walker  OT Visit Diagnosis: Unsteadiness on feet (R26.81)   Activity Tolerance Patient tolerated treatment well   Patient Left in bed;with call bell/phone within reach;with bed alarm set;with family/visitor present   Nurse Communication Mobility status        Time: 6606-3016 OT Time Calculation (min): 23 min  Charges: OT General Charges $OT Visit: 1 Visit OT Treatments $Self Care/Home Management : 23-37 mins  Fredirick Maudlin, OTR/L [place]

## 2020-10-26 NOTE — Progress Notes (Signed)
   Subjective: 1 Day Post-Op Procedure(s) (LRB): TOTAL KNEE ARTHROPLASTY - Rachelle Hora to assist (Right) Patient reports pain as mild.   Patient is well, and has had no acute complaints or problems Denies any CP, SOB, ABD pain. We will continue therapy today.  Plan is to go Home after hospital stay.  Objective: Vital signs in last 24 hours: Temp:  [96.9 F (36.1 C)-98.3 F (36.8 C)] 97.9 F (36.6 C) (06/29 0736) Pulse Rate:  [57-97] 73 (06/29 0736) Resp:  [16-24] 18 (06/29 0736) BP: (100-158)/(30-93) 157/67 (06/29 0736) SpO2:  [93 %-100 %] 96 % (06/29 0736)  Intake/Output from previous day: 06/28 0701 - 06/29 0700 In: 1897.2 [P.O.:120; I.V.:1477.2; IV Piggyback:300] Out: 975 [Urine:875; Blood:100] Intake/Output this shift: No intake/output data recorded.  Recent Labs    10/25/20 1133 10/26/20 0409  HGB 11.9* 11.6*   Recent Labs    10/25/20 1133 10/26/20 0409  WBC 12.0* 9.5  RBC 4.36 4.20  HCT 36.4 34.8*  PLT 167 173   Recent Labs    10/25/20 1133 10/26/20 0409  NA  --  138  K  --  4.6  CL  --  107  CO2  --  27  BUN  --  18  CREATININE 1.22* 0.90  GLUCOSE  --  144*  CALCIUM  --  8.7*   No results for input(s): LABPT, INR in the last 72 hours.  EXAM General - Patient is Alert, Appropriate, and Oriented Extremity - Neurovascular intact Sensation intact distally Intact pulses distally Dorsiflexion/Plantar flexion intact No cellulitis present Compartment soft Dressing - dressing C/D/I, prevena intact with 250cc drainage Motor Function - intact, moving foot and toes well on exam.   Past Medical History:  Diagnosis Date   Arthritis    neck, knees   Asthma    Cancer ([place])    skin, basal cell removed from neck, back and foot   Carpal tunnel syndrome    Colon polyps    Difficult intubation    pt unaware   Gastric ulcer    GERD (gastroesophageal reflux disease)    on dexilant   Gout    Hyperlipidemia    Hypertension    controlled on meds    Iron deficiency anemia    Sleep apnea    does not use cpap   Vertigo    last episode 12/2018   Wears dentures    partial upper    Assessment/Plan:   1 Day Post-Op Procedure(s) (LRB): TOTAL KNEE ARTHROPLASTY - Rachelle Hora to assist (Right) Active Problems:   S/P TKR (total knee replacement) using cement, right  Estimated body mass index is 46.43 kg/m as calculated from the following:   Height as of this encounter: 5\' 5"  (1.651 m).   Weight as of 10/14/20: 126.6 kg. Advance diet Up with therapy Work on [employer] and vss Pain well controlled CM to assist with discharge to home with HHPT  DVT Prophylaxis - Lovenox, Foot Pumps, and TED hose Weight-Bearing as tolerated to right leg   T. Rachelle Hora, PA-C [place] 10/26/2020, 7:53 AM

## 2020-10-26 NOTE — Discharge Instructions (Signed)

## 2020-10-26 NOTE — Progress Notes (Signed)
Physical Therapy Treatment Patient Details Name: Sarah Marquez MRN: [id number] DOB: 1955/06/12 Today's Date: 10/26/2020    History of Present Illness Pt is a 58 female s/p elective right TKA (6/28). PMH includes: Anemia, bilateral carpal tunnel syndrome, GERD (gastroesophageal reflux disease), Hyperlipidemia, Hypertension, skin CA, and osteoarthritis.    PT Comments    Pt was still sitting up in recliner upon arriving form AM session. Was pre-medicated and agreeable to session. Easily able to stand and ambulate with RW without physical assistance. Supportive spouse present throughout session and will be available to assist pt at Dc'd. She demonstrated ~ 80 degrees knee flexion while only lacking 3 degrees ext. Reviewed polar care, wound vac and exercises to promote strengthening  and ROM improvements. OT in room at conclusion of session. Acute PT will continue to follow and progress as able per POC.    Follow Up Recommendations  Home health PT;Supervision for mobility/OOB     Equipment Recommendations  Rolling walker with 5" wheels;3in1 (PT)       Precautions / Restrictions Precautions Precautions: Fall Restrictions Weight Bearing Restrictions: Yes RLE Weight Bearing: Weight bearing as tolerated    Mobility  Bed Mobility     General bed mobility comments: Pt was in recliner pre session and in BR with OT post session    Transfers Overall transfer level: Needs assistance Equipment used: Rolling walker (2 wheeled) Transfers: Sit to/from Stand Sit to Stand: Min guard         General transfer comment: CGA for safety  Ambulation/Gait Ambulation/Gait assistance: Supervision Gait Distance (Feet): 160 Feet Assistive device: Rolling walker (2 wheeled) Gait Pattern/deviations: Step-through pattern;Trunk flexed Gait velocity: decreased   General Gait Details: Pt was able to ambulate 1 lap in hallway with RW without rest. Pt tolerated well.      Balance Overall balance  assessment: Modified Independent          Cognition Arousal/Alertness: Awake/alert Behavior During Therapy: WFL for tasks assessed/performed Overall Cognitive Status: Within Functional Limits for tasks assessed         General Comments: Pleasant and agreeable throughout             Pertinent Vitals/Pain Pain Assessment: 0-10 Pain Score: 7  Pain Location: R knee Pain Descriptors / Indicators: Aching;Sore Pain Intervention(s): Monitored during session;Limited activity within patient's tolerance;Premedicated before session;Repositioned     PT Goals (current goals can now be found in the care plan section) Acute Rehab PT Goals Patient Stated Goal: go home Progress towards PT goals: Progressing toward goals    Frequency    BID      PT Plan Current plan remains appropriate       AM-PAC PT "6 Clicks" Mobility   Outcome Measure  Help needed turning from your back to your side while in a flat bed without using bedrails?: A Little Help needed moving from lying on your back to sitting on the side of a flat bed without using bedrails?: A Little Help needed moving to and from a bed to a chair (including a wheelchair)?: A Little Help needed standing up from a chair using your arms (e.g., wheelchair or bedside chair)?: A Little Help needed to walk in hospital room?: A Little Help needed climbing 3-5 steps with a railing? : A Little 6 Click Score: 18    End of Session Equipment Utilized During Treatment: Gait belt Activity Tolerance: Patient tolerated treatment well Patient left: Other (comment) (in BR with OT at conclusion of session) Nurse Communication:  Mobility status PT Visit Diagnosis: Other abnormalities of gait and mobility (R26.89);Muscle weakness (generalized) (M62.81);Pain Pain - Right/Left: Right Pain - part of body: Knee     Time: 1445-1515 PT Time Calculation (min) (ACUTE ONLY): 30 min  Charges:  $Gait Training: 8-22 mins $Therapeutic Activity: 8-22  mins                    Julaine Fusi PTA 10/26/20, 3:35 PM

## 2020-10-26 NOTE — Progress Notes (Signed)
Physical Therapy Treatment Patient Details Name: Sarah Marquez MRN: [id number] DOB: [birth year] Today's Date: 10/26/2020    History of Present Illness Pt is a 59 female s/p elective right TKA (6/28). PMH includes: Anemia, bilateral carpal tunnel syndrome, GERD (gastroesophageal reflux disease), Hyperlipidemia, Hypertension, skin CA, and osteoarthritis.    PT Comments    Pt was seated EOB upon arriving. Was able to exit bed with RN tech without assistance. Pt is extremely pleasant and motivated. Was easily able to stand and ambulate one lap in hallway prior to returning to room. Lengthy discussion about DC expectations and HHPT. Acute PT will continue to follow and progress as able per POC. She did demonstrate safe enough abilities to DC home with [facility] from a acute PT standpoint. Will return later this afternoon to progress strength and ROM.   Follow Up Recommendations  Home health PT;Supervision for mobility/OOB     Equipment Recommendations  Rolling walker with 5" wheels;3in1 (PT)       Precautions / Restrictions Precautions Precautions: Fall Restrictions Weight Bearing Restrictions: Yes RLE Weight Bearing: Weight bearing as tolerated    Mobility  Bed Mobility Overal bed mobility: Modified Independent Bed Mobility: Supine to Sit     Supine to sit: HOB elevated;Supervision     General bed mobility comments: Pt was able to exit R side of bed without physical assistance.    Transfers Overall transfer level: Needs assistance Equipment used: Rolling walker (2 wheeled) Transfers: Sit to/from Stand Sit to Stand: Min guard         General transfer comment: Pt was easily able to stand fro only slightly elevated bed height. Reports home bed is very elevated.  Ambulation/Gait Ambulation/Gait assistance: Min guard;Supervision Gait Distance (Feet): 160 Feet Assistive device: Rolling walker (2 wheeled) Gait Pattern/deviations: Step-to pattern;Decreased stance time -  right;Antalgic Gait velocity: decreased   General Gait Details: no LOB or safety concerns with ambulation 1 lap in hallway(~160 ft)   Stairs Stairs:  (pt has ramp entry)      Balance Overall balance assessment: Needs assistance Sitting-balance support: No upper extremity supported;Feet supported Sitting balance-Leahy Scale: Good Sitting balance - Comments: Good balance sitting EOB while reaching outside BOS   Standing balance support: Bilateral upper extremity supported;During functional activity Standing balance-Leahy Scale: Good Standing balance comment: no balance deficits with BUE support on RW during static and dynamic activity        Cognition Arousal/Alertness: Awake/alert Behavior During Therapy: WFL for tasks assessed/performed Overall Cognitive Status: Within Functional Limits for tasks assessed        General Comments: Pleasant and agreeable throughout             Pertinent Vitals/Pain Pain Assessment: 0-10 Pain Score: 4  Pain Location: R knee Pain Descriptors / Indicators: Aching;Sore Pain Intervention(s): Limited activity within patient's tolerance;Monitored during session;Premedicated before session;Repositioned;Ice applied     PT Goals (current goals can now be found in the care plan section) Acute Rehab PT Goals Patient Stated Goal: go home Progress towards PT goals: Progressing toward goals    Frequency    BID      PT Plan Current plan remains appropriate       AM-PAC PT "6 Clicks" Mobility   Outcome Measure  Help needed turning from your back to your side while in a flat bed without using bedrails?: A Little Help needed moving from lying on your back to sitting on the side of a flat bed without using bedrails?: A Little Help needed  moving to and from a bed to a chair (including a wheelchair)?: A Little Help needed standing up from a chair using your arms (e.g., wheelchair or bedside chair)?: A Little Help needed to walk in hospital  room?: A Little Help needed climbing 3-5 steps with a railing? : A Little 6 Click Score: 18    End of Session Equipment Utilized During Treatment: Gait belt Activity Tolerance: Patient tolerated treatment well Patient left: in chair;with call bell/phone within reach;with chair alarm set;with SCD's reapplied;with family/visitor present;Other (comment) Nurse Communication: Mobility status PT Visit Diagnosis: Other abnormalities of gait and mobility (R26.89);Muscle weakness (generalized) (M62.81);Pain Pain - Right/Left: Right Pain - part of body: Knee     Time: 1040-1109 PT Time Calculation (min) (ACUTE ONLY): 29 min  Charges:  $Gait Training: 8-22 mins $Therapeutic Activity: 8-22 mins                    Julaine Fusi PTA 10/26/20, 11:25 AM

## 2020-10-26 NOTE — Plan of Care (Signed)
  Problem: Education: Goal: Knowledge of the prescribed therapeutic regimen will improve Outcome: Progressing Goal: Individualized Educational Video(s) Outcome: Progressing   Problem: Activity: Goal: Ability to avoid complications of mobility impairment will improve Outcome: Progressing Goal: Range of joint motion will improve Outcome: Progressing   Problem: Clinical Measurements: Goal: Postoperative complications will be avoided or minimized Outcome: Progressing   Problem: Pain Management: Goal: Pain level will decrease with appropriate interventions Outcome: Progressing   Problem: Skin Integrity: Goal: Will show signs of wound healing Outcome: Progressing   Problem: Health Behavior/Discharge Planning: Goal: Ability to manage health-related needs will improve Outcome: Progressing   Problem: Clinical Measurements: Goal: Ability to maintain clinical measurements within normal limits will improve Outcome: Progressing Goal: Will remain free from infection Outcome: Progressing Goal: Diagnostic test results will improve Outcome: Progressing Goal: Respiratory complications will improve Outcome: Progressing   Problem: Activity: Goal: Risk for activity intolerance will decrease Outcome: Progressing   Problem: Nutrition: Goal: Adequate nutrition will be maintained Outcome: Progressing   Problem: Coping: Goal: Level of anxiety will decrease Outcome: Progressing   Problem: Elimination: Goal: Will not experience complications related to bowel motility Outcome: Progressing Goal: Will not experience complications related to urinary retention Outcome: Progressing   Problem: Pain Managment: Goal: General experience of comfort will improve Outcome: Progressing   Problem: Safety: Goal: Ability to remain free from injury will improve Outcome: Progressing   Problem: Skin Integrity: Goal: Risk for impaired skin integrity will decrease Outcome: Progressing

## 2020-10-27 NOTE — Progress Notes (Signed)
Blood pressure (!) 108/45, pulse (!) 58, temperature 98.1 F (36.7 C), resp. rate 17, height 5\' 5"  (1.651 m), weight 126.6 kg, SpO2 91 %. IV cath removed site was C/D/I. Discussed D/C packet with pt and husband at bedside both verbalized understanding of the information provided. Switched wound vac to [place], pt transported with all belongings via W/C to private car.

## 2020-10-27 NOTE — Progress Notes (Signed)
Physical Therapy Treatment Patient Details Name: Sarah Marquez MRN: [id number] DOB: 09-15-1955 Today's Date: 10/27/2020    History of Present Illness Pt is a 5 female s/p elective right TKA (6/28). PMH includes: Anemia, bilateral carpal tunnel syndrome, GERD (gastroesophageal reflux disease), Hyperlipidemia, Hypertension, skin CA, and osteoarthritis.    PT Comments    Pt was sitting in recliner upon arriving. Continues to be motivated and pleasant. Easily able to stand and ambulate with RW however does have some SOB/wheezing during gait training. MD/RN made aware. Overall tolerated session well and continues to demonstrate safe abilities to DC home with HHPT to follow. Pt has yet to have BM since Sx. Will continue to follow and progress as able per POC.    Follow Up Recommendations  Home health PT;Supervision for mobility/OOB     Equipment Recommendations  Rolling walker with 5" wheels;3in1 (PT)       Precautions / Restrictions Precautions Precautions: Fall Restrictions Weight Bearing Restrictions: Yes RLE Weight Bearing: Weight bearing as tolerated    Mobility  Bed Mobility      General bed mobility comments: in recliner pre/post session    Transfers Overall transfer level: Modified independent Equipment used: Rolling walker (2 wheeled) Transfers: Sit to/from Stand Sit to Stand: Supervision         General transfer comment: pt was able to safely stand/sit without physical assistance  Ambulation/Gait Ambulation/Gait assistance: Supervision Gait Distance (Feet): 160 Feet Assistive device: Rolling walker (2 wheeled) Gait Pattern/deviations: Step-through pattern Gait velocity: decreased   General Gait Details: Pt was able to safely ambulate 160 ft however becomes SOB/wheezy during. SAo2 was stable. RN and MD made aware.       Balance Overall balance assessment: Needs assistance Sitting-balance support: No upper extremity supported;Feet supported Sitting  balance-Leahy Scale: Good Sitting balance - Comments: Good dyanamic sitting balance while performing peri-care   Standing balance support: Bilateral upper extremity supported;During functional activity Standing balance-Leahy Scale: Good Standing balance comment: no LOB throughout session with static standing without UE support and with dynamic activity with BUE        Cognition Arousal/Alertness: Awake/alert Behavior During Therapy: WFL for tasks assessed/performed Overall Cognitive Status: Within Functional Limits for tasks assessed      General Comments: Pt is A and O x 4             Pertinent Vitals/Pain Pain Assessment: 0-10 Pain Score: 4  Pain Location: R knee Pain Descriptors / Indicators: Aching;Sore Pain Intervention(s): Limited activity within patient's tolerance;Monitored during session;Premedicated before session;Repositioned;Ice applied     PT Goals (current goals can now be found in the care plan section) Acute Rehab PT Goals Patient Stated Goal: go home Progress towards PT goals: Progressing toward goals    Frequency    BID      PT Plan Current plan remains appropriate       AM-PAC PT "6 Clicks" Mobility   Outcome Measure  Help needed turning from your back to your side while in a flat bed without using bedrails?: A Little Help needed moving from lying on your back to sitting on the side of a flat bed without using bedrails?: A Little Help needed moving to and from a bed to a chair (including a wheelchair)?: A Little Help needed standing up from a chair using your arms (e.g., wheelchair or bedside chair)?: A Little Help needed to walk in hospital room?: A Little Help needed climbing 3-5 steps with a railing? : A Little 6 Click  Score: 18    End of Session Equipment Utilized During Treatment: Gait belt Activity Tolerance: Patient tolerated treatment well Patient left: in chair;with call bell/phone within reach;with chair alarm set Nurse  Communication: Mobility status PT Visit Diagnosis: Other abnormalities of gait and mobility (R26.89);Muscle weakness (generalized) (M62.81);Pain Pain - Right/Left: Right Pain - part of body: Knee     Time: 9038-3338 PT Time Calculation (min) (ACUTE ONLY): 23 min  Charges:  $Gait Training: 8-22 mins $Therapeutic Activity: 8-22 mins                     Julaine Fusi PTA 10/27/20, 10:43 AM

## 2020-10-27 NOTE — TOC Transition Note (Signed)
Transition of Care [facility]) - CM/SW Discharge Note   Patient Details  Name: Sarah Marquez MRN: [id number] Date of Birth: Apr 14, 1956  [facility] of [facility]) CM/SW Contact:  Pete Pelt, RN Phone Number: 10/27/2020, 9:38 AM   Clinical Narrative:   Patient discharging, [facility] will provide home health for patient, DME has been delivered to room.  TOC signing off.      Barriers to Discharge: Continued Medical Work up   Patient Goals and CMS Choice Patient states their goals for this hospitalization and ongoing recovery are:: To go home with home health and heal      Discharge Placement                       Discharge Plan and Services   Discharge Planning Services: CM Consult Post Acute Care Choice: Home Health          DME Arranged: 3-N-1, Walker rolling DME Agency: [employer] Date DME Agency Contacted: 10/25/20   Representative spoke with at DME Agency: Suanne Marker HH Arranged: PT, OT          Social Determinants of Health (SDOH) Interventions     Readmission Risk Interventions No flowsheet data found.

## 2020-10-27 NOTE — Progress Notes (Signed)
   Subjective: 2 Days Post-Op Procedure(s) (LRB): TOTAL KNEE ARTHROPLASTY - Rachelle Hora to assist (Right) Patient reports pain as mild.   Patient is well, and has had no acute complaints or problems Denies any CP, SOB, ABD pain. We will continue therapy today.  Plan is to go Home after hospital stay.  Objective: Vital signs in last 24 hours: Temp:  [97.9 F (36.6 C)-98.5 F (36.9 C)] 98.5 F (36.9 C) (06/29 2103) Pulse Rate:  [58-77] 71 (06/29 2103) Resp:  [16-18] 18 (06/29 2103) BP: (113-157)/(52-73) 113/52 (06/29 2103) SpO2:  [94 %-96 %] 95 % (06/29 2103) Weight:  [126.6 kg] 126.6 kg (06/29 1349)  Intake/Output from previous day: 06/29 0701 - 06/30 0700 In: 240 [P.O.:240] Out: -  Intake/Output this shift: No intake/output data recorded.  Recent Labs    10/25/20 1133 10/26/20 0409  HGB 11.9* 11.6*   Recent Labs    10/25/20 1133 10/26/20 0409  WBC 12.0* 9.5  RBC 4.36 4.20  HCT 36.4 34.8*  PLT 167 173   Recent Labs    10/25/20 1133 10/26/20 0409  NA  --  138  K  --  4.6  CL  --  107  CO2  --  27  BUN  --  18  CREATININE 1.22* 0.90  GLUCOSE  --  144*  CALCIUM  --  8.7*   No results for input(s): LABPT, INR in the last 72 hours.  EXAM General - Patient is Alert, Appropriate, and Oriented Extremity - Neurovascular intact Sensation intact distally Intact pulses distally Dorsiflexion/Plantar flexion intact No cellulitis present Compartment soft Dressing - dressing C/D/I, prevena intact with 100 cc drainage Motor Function - intact, moving foot and toes well on exam.  The patient ambulated 160 feet with physical therapy  Past Medical History:  Diagnosis Date   Arthritis    neck, knees   Asthma    Cancer ([place])    skin, basal cell removed from neck, back and foot   Carpal tunnel syndrome    Colon polyps    Difficult intubation    pt unaware   Gastric ulcer    GERD (gastroesophageal reflux disease)    on dexilant   Gout    Hyperlipidemia     Hypertension    controlled on meds   Iron deficiency anemia    Sleep apnea    does not use cpap   Vertigo    last episode 12/2018   Wears dentures    partial upper    Assessment/Plan:   2 Days Post-Op Procedure(s) (LRB): TOTAL KNEE ARTHROPLASTY - Rachelle Hora to assist (Right) Active Problems:   S/P TKR (total knee replacement) using cement, right  Estimated body mass index is 46.43 kg/m as calculated from the following:   Height as of this encounter: 5\' 5"  (1.651 m).   Weight as of this encounter: 126.6 kg. Advance diet Up with therapy Work on BM  Pain well controlled CM to assist with discharge to home with HHPT today  DVT Prophylaxis - Lovenox, Foot Pumps, and TED hose Weight-Bearing as tolerated to right leg   Reche Dixon, PA-C [place] 10/27/2020, 6:27 AM

## 2020-10-28 DIAGNOSIS — E559 Vitamin D deficiency, unspecified: Secondary | ICD-10-CM | POA: Diagnosis not present

## 2020-10-28 DIAGNOSIS — Z720 Tobacco use: Secondary | ICD-10-CM | POA: Diagnosis not present

## 2020-10-28 DIAGNOSIS — J309 Allergic rhinitis, unspecified: Secondary | ICD-10-CM | POA: Diagnosis not present

## 2020-10-28 DIAGNOSIS — K219 Gastro-esophageal reflux disease without esophagitis: Secondary | ICD-10-CM | POA: Diagnosis not present

## 2020-10-28 DIAGNOSIS — M150 Primary generalized (osteo)arthritis: Secondary | ICD-10-CM | POA: Diagnosis not present

## 2020-10-28 DIAGNOSIS — E785 Hyperlipidemia, unspecified: Secondary | ICD-10-CM | POA: Diagnosis not present

## 2020-10-28 DIAGNOSIS — E538 Deficiency of other specified B group vitamins: Secondary | ICD-10-CM | POA: Diagnosis not present

## 2020-10-28 DIAGNOSIS — I10 Essential (primary) hypertension: Secondary | ICD-10-CM | POA: Diagnosis not present

## 2020-10-28 DIAGNOSIS — Z86010 Personal history of colonic polyps: Secondary | ICD-10-CM | POA: Diagnosis not present

## 2020-10-28 DIAGNOSIS — Z7901 Long term (current) use of anticoagulants: Secondary | ICD-10-CM | POA: Diagnosis not present

## 2020-10-28 DIAGNOSIS — D509 Iron deficiency anemia, unspecified: Secondary | ICD-10-CM | POA: Diagnosis not present

## 2020-10-28 DIAGNOSIS — Z6841 Body Mass Index (BMI) 40.0 and over, adult: Secondary | ICD-10-CM | POA: Diagnosis not present

## 2020-10-28 DIAGNOSIS — H8110 Benign paroxysmal vertigo, unspecified ear: Secondary | ICD-10-CM | POA: Diagnosis not present

## 2020-10-28 DIAGNOSIS — Z96651 Presence of right artificial knee joint: Secondary | ICD-10-CM | POA: Diagnosis not present

## 2020-10-28 DIAGNOSIS — J45991 Cough variant asthma: Secondary | ICD-10-CM | POA: Diagnosis not present

## 2020-10-28 DIAGNOSIS — M109 Gout, unspecified: Secondary | ICD-10-CM | POA: Diagnosis not present

## 2020-10-28 DIAGNOSIS — M47812 Spondylosis without myelopathy or radiculopathy, cervical region: Secondary | ICD-10-CM | POA: Diagnosis not present

## 2020-10-28 DIAGNOSIS — E8881 Metabolic syndrome: Secondary | ICD-10-CM | POA: Diagnosis not present

## 2020-10-28 DIAGNOSIS — Z87440 Personal history of urinary (tract) infections: Secondary | ICD-10-CM | POA: Diagnosis not present

## 2020-10-28 DIAGNOSIS — G5603 Carpal tunnel syndrome, bilateral upper limbs: Secondary | ICD-10-CM | POA: Diagnosis not present

## 2020-10-28 DIAGNOSIS — Z471 Aftercare following joint replacement surgery: Secondary | ICD-10-CM | POA: Diagnosis not present

## 2020-10-28 DIAGNOSIS — G4730 Sleep apnea, unspecified: Secondary | ICD-10-CM | POA: Diagnosis not present

## 2020-10-28 DIAGNOSIS — Z85828 Personal history of other malignant neoplasm of skin: Secondary | ICD-10-CM | POA: Diagnosis not present

## 2020-10-30 DIAGNOSIS — K219 Gastro-esophageal reflux disease without esophagitis: Secondary | ICD-10-CM | POA: Diagnosis not present

## 2020-10-30 DIAGNOSIS — G5603 Carpal tunnel syndrome, bilateral upper limbs: Secondary | ICD-10-CM | POA: Diagnosis not present

## 2020-10-30 DIAGNOSIS — H8110 Benign paroxysmal vertigo, unspecified ear: Secondary | ICD-10-CM | POA: Diagnosis not present

## 2020-10-30 DIAGNOSIS — E559 Vitamin D deficiency, unspecified: Secondary | ICD-10-CM | POA: Diagnosis not present

## 2020-10-30 DIAGNOSIS — Z96651 Presence of right artificial knee joint: Secondary | ICD-10-CM | POA: Diagnosis not present

## 2020-10-30 DIAGNOSIS — M109 Gout, unspecified: Secondary | ICD-10-CM | POA: Diagnosis not present

## 2020-10-30 DIAGNOSIS — Z471 Aftercare following joint replacement surgery: Secondary | ICD-10-CM | POA: Diagnosis not present

## 2020-10-30 DIAGNOSIS — J309 Allergic rhinitis, unspecified: Secondary | ICD-10-CM | POA: Diagnosis not present

## 2020-10-30 DIAGNOSIS — I10 Essential (primary) hypertension: Secondary | ICD-10-CM | POA: Diagnosis not present

## 2020-10-30 DIAGNOSIS — E538 Deficiency of other specified B group vitamins: Secondary | ICD-10-CM | POA: Diagnosis not present

## 2020-10-30 DIAGNOSIS — Z6841 Body Mass Index (BMI) 40.0 and over, adult: Secondary | ICD-10-CM | POA: Diagnosis not present

## 2020-10-30 DIAGNOSIS — D509 Iron deficiency anemia, unspecified: Secondary | ICD-10-CM | POA: Diagnosis not present

## 2020-10-30 DIAGNOSIS — E785 Hyperlipidemia, unspecified: Secondary | ICD-10-CM | POA: Diagnosis not present

## 2020-10-30 DIAGNOSIS — E8881 Metabolic syndrome: Secondary | ICD-10-CM | POA: Diagnosis not present

## 2020-10-30 DIAGNOSIS — Z720 Tobacco use: Secondary | ICD-10-CM | POA: Diagnosis not present

## 2020-10-30 DIAGNOSIS — Z7901 Long term (current) use of anticoagulants: Secondary | ICD-10-CM | POA: Diagnosis not present

## 2020-10-30 DIAGNOSIS — G4730 Sleep apnea, unspecified: Secondary | ICD-10-CM | POA: Diagnosis not present

## 2020-10-30 DIAGNOSIS — J45991 Cough variant asthma: Secondary | ICD-10-CM | POA: Diagnosis not present

## 2020-10-30 DIAGNOSIS — M150 Primary generalized (osteo)arthritis: Secondary | ICD-10-CM | POA: Diagnosis not present

## 2020-10-30 DIAGNOSIS — M47812 Spondylosis without myelopathy or radiculopathy, cervical region: Secondary | ICD-10-CM | POA: Diagnosis not present

## 2020-10-30 DIAGNOSIS — Z87440 Personal history of urinary (tract) infections: Secondary | ICD-10-CM | POA: Diagnosis not present

## 2020-10-30 DIAGNOSIS — Z86010 Personal history of colonic polyps: Secondary | ICD-10-CM | POA: Diagnosis not present

## 2020-10-30 DIAGNOSIS — Z85828 Personal history of other malignant neoplasm of skin: Secondary | ICD-10-CM | POA: Diagnosis not present

## 2020-11-05 ENCOUNTER — Other Ambulatory Visit: Payer: Self-pay | Admitting: Family Medicine

## 2020-11-05 NOTE — Telephone Encounter (Signed)
Requested medications are due for refill today yes  Requested medications are on the active medication list yes  Last refill 4/12  Last visit 03/2020  Future visit scheduled 01/26/21  Notes to clinic Not Delegated

## 2020-11-08 DIAGNOSIS — M25661 Stiffness of right knee, not elsewhere classified: Secondary | ICD-10-CM | POA: Diagnosis not present

## 2020-11-08 DIAGNOSIS — G8929 Other chronic pain: Secondary | ICD-10-CM | POA: Diagnosis not present

## 2020-11-08 DIAGNOSIS — Z96651 Presence of right artificial knee joint: Secondary | ICD-10-CM | POA: Diagnosis not present

## 2020-11-08 DIAGNOSIS — M6281 Muscle weakness (generalized): Secondary | ICD-10-CM | POA: Diagnosis not present

## 2020-11-08 DIAGNOSIS — M25561 Pain in right knee: Secondary | ICD-10-CM | POA: Diagnosis not present

## 2020-11-11 DIAGNOSIS — M6281 Muscle weakness (generalized): Secondary | ICD-10-CM | POA: Diagnosis not present

## 2020-11-11 DIAGNOSIS — M25661 Stiffness of right knee, not elsewhere classified: Secondary | ICD-10-CM | POA: Diagnosis not present

## 2020-11-11 DIAGNOSIS — M25561 Pain in right knee: Secondary | ICD-10-CM | POA: Diagnosis not present

## 2020-11-11 DIAGNOSIS — G8929 Other chronic pain: Secondary | ICD-10-CM | POA: Diagnosis not present

## 2020-11-11 DIAGNOSIS — Z96651 Presence of right artificial knee joint: Secondary | ICD-10-CM | POA: Diagnosis not present

## 2020-11-15 DIAGNOSIS — Z96651 Presence of right artificial knee joint: Secondary | ICD-10-CM | POA: Diagnosis not present

## 2020-11-16 DIAGNOSIS — R053 Chronic cough: Secondary | ICD-10-CM | POA: Diagnosis not present

## 2020-11-22 DIAGNOSIS — M25661 Stiffness of right knee, not elsewhere classified: Secondary | ICD-10-CM | POA: Diagnosis not present

## 2020-11-22 DIAGNOSIS — M25561 Pain in right knee: Secondary | ICD-10-CM | POA: Diagnosis not present

## 2020-11-22 DIAGNOSIS — G8929 Other chronic pain: Secondary | ICD-10-CM | POA: Diagnosis not present

## 2020-11-22 DIAGNOSIS — M6281 Muscle weakness (generalized): Secondary | ICD-10-CM | POA: Diagnosis not present

## 2020-11-22 DIAGNOSIS — Z96651 Presence of right artificial knee joint: Secondary | ICD-10-CM | POA: Diagnosis not present

## 2020-11-24 DIAGNOSIS — G8929 Other chronic pain: Secondary | ICD-10-CM | POA: Diagnosis not present

## 2020-11-24 DIAGNOSIS — M25561 Pain in right knee: Secondary | ICD-10-CM | POA: Diagnosis not present

## 2020-11-24 DIAGNOSIS — M25661 Stiffness of right knee, not elsewhere classified: Secondary | ICD-10-CM | POA: Diagnosis not present

## 2020-11-24 DIAGNOSIS — Z96651 Presence of right artificial knee joint: Secondary | ICD-10-CM | POA: Diagnosis not present

## 2020-11-24 DIAGNOSIS — M6281 Muscle weakness (generalized): Secondary | ICD-10-CM | POA: Diagnosis not present

## 2020-11-28 DIAGNOSIS — G8929 Other chronic pain: Secondary | ICD-10-CM | POA: Diagnosis not present

## 2020-11-28 DIAGNOSIS — M25561 Pain in right knee: Secondary | ICD-10-CM | POA: Diagnosis not present

## 2020-11-28 DIAGNOSIS — M25661 Stiffness of right knee, not elsewhere classified: Secondary | ICD-10-CM | POA: Diagnosis not present

## 2020-11-28 DIAGNOSIS — M6281 Muscle weakness (generalized): Secondary | ICD-10-CM | POA: Diagnosis not present

## 2020-11-28 DIAGNOSIS — Z96651 Presence of right artificial knee joint: Secondary | ICD-10-CM | POA: Diagnosis not present

## 2020-11-30 DIAGNOSIS — M25561 Pain in right knee: Secondary | ICD-10-CM | POA: Diagnosis not present

## 2020-11-30 DIAGNOSIS — M25661 Stiffness of right knee, not elsewhere classified: Secondary | ICD-10-CM | POA: Diagnosis not present

## 2020-11-30 DIAGNOSIS — Z96651 Presence of right artificial knee joint: Secondary | ICD-10-CM | POA: Diagnosis not present

## 2020-11-30 DIAGNOSIS — M6281 Muscle weakness (generalized): Secondary | ICD-10-CM | POA: Diagnosis not present

## 2020-11-30 DIAGNOSIS — G8929 Other chronic pain: Secondary | ICD-10-CM | POA: Diagnosis not present

## 2020-12-06 DIAGNOSIS — Z96651 Presence of right artificial knee joint: Secondary | ICD-10-CM | POA: Diagnosis not present

## 2020-12-07 DIAGNOSIS — L89312 Pressure ulcer of right buttock, stage 2: Secondary | ICD-10-CM | POA: Diagnosis not present

## 2020-12-07 DIAGNOSIS — M1711 Unilateral primary osteoarthritis, right knee: Secondary | ICD-10-CM | POA: Diagnosis not present

## 2020-12-07 DIAGNOSIS — Z96651 Presence of right artificial knee joint: Secondary | ICD-10-CM | POA: Diagnosis not present

## 2021-01-10 ENCOUNTER — Other Ambulatory Visit: Payer: Self-pay | Admitting: Family Medicine

## 2021-01-10 DIAGNOSIS — Z1231 Encounter for screening mammogram for malignant neoplasm of breast: Secondary | ICD-10-CM

## 2021-01-18 DIAGNOSIS — L309 Dermatitis, unspecified: Secondary | ICD-10-CM | POA: Diagnosis not present

## 2021-01-18 DIAGNOSIS — L650 Telogen effluvium: Secondary | ICD-10-CM | POA: Diagnosis not present

## 2021-01-25 ENCOUNTER — Ambulatory Visit
Admission: RE | Admit: 2021-01-25 | Discharge: 2021-01-25 | Disposition: A | Discharge status: Home / Self Care | Payer: PPO | Source: Ambulatory Visit | Attending: Family Medicine | Admitting: Family Medicine

## 2021-01-25 ENCOUNTER — Other Ambulatory Visit: Payer: Self-pay

## 2021-01-25 DIAGNOSIS — Z1231 Encounter for screening mammogram for malignant neoplasm of breast: Secondary | ICD-10-CM | POA: Diagnosis not present

## 2021-01-26 ENCOUNTER — Encounter: Payer: Self-pay | Admitting: Family Medicine

## 2021-01-26 ENCOUNTER — Ambulatory Visit (INDEPENDENT_AMBULATORY_CARE_PROVIDER_SITE_OTHER): Payer: PPO | Admitting: Family Medicine

## 2021-01-26 VITALS — BP 119/72 | HR 83 | Temp 98.4°F | Wt 261.0 lb

## 2021-01-26 DIAGNOSIS — E785 Hyperlipidemia, unspecified: Secondary | ICD-10-CM | POA: Diagnosis not present

## 2021-01-26 DIAGNOSIS — D508 Other iron deficiency anemias: Secondary | ICD-10-CM

## 2021-01-26 DIAGNOSIS — E538 Deficiency of other specified B group vitamins: Secondary | ICD-10-CM | POA: Diagnosis not present

## 2021-01-26 DIAGNOSIS — Z23 Encounter for immunization: Secondary | ICD-10-CM

## 2021-01-26 DIAGNOSIS — I10 Essential (primary) hypertension: Secondary | ICD-10-CM | POA: Diagnosis not present

## 2021-01-26 DIAGNOSIS — Z96651 Presence of right artificial knee joint: Secondary | ICD-10-CM | POA: Diagnosis not present

## 2021-01-26 DIAGNOSIS — I872 Venous insufficiency (chronic) (peripheral): Secondary | ICD-10-CM

## 2021-01-26 MED ORDER — VERAPAMIL HCL ER 180 MG PO TBCR: 180.0000 mg | EXTENDED_RELEASE_TABLET | Freq: Every day | ORAL | 1 refills | Status: DC

## 2021-01-26 NOTE — Progress Notes (Signed)
Established patient visit   Patient: Sarah Marquez   DOB: Mar 19, 1956   64 y.o. Female  MRN: [id number] Visit Date: 01/26/2021  Today's healthcare provider: Wilhemena Durie, MD   Chief Complaint  Patient presents with   Hypertension   Hyperlipidemia    Subjective    HPI  Overall patient feels well.  She has had some recent diarrhea which is resolving after right total knee replacement. Hypertension, follow-up  BP Readings from Last 3 Encounters:  01/26/21 119/72  10/27/20 (!) 108/45  10/14/20 134/62   Wt Readings from Last 3 Encounters:  01/26/21 261 lb (118.4 kg)  10/26/20 279 lb (126.6 kg)  10/14/20 279 lb (126.6 kg)     She was last seen for hypertension 6 months ago.  BP at that visit was 139/72. Management since that visit includes; Good control on verapamil and losartan. She reports excellent compliance with treatment. She is not having side effects.  She is exercising. She is adherent to low salt diet.   Outside blood pressures are running low since her knee replacement in June.  She does smoke.  Use of agents associated with hypertension: .   --------------------------------------------------------------------------------------------------- Lipid/Cholesterol, follow-up  Last Lipid Panel: Lab Results  Component Value Date   CHOL 143 03/30/2020   LDLCALC 81 03/30/2020   HDL 41 03/30/2020   TRIG 116 03/30/2020    She was last seen for this 9 months ago.  Management since that visit includes; on rosuvastatin.  She reports excellent compliance with treatment. She is not having side effects.    Last metabolic panel Lab Results  Component Value Date   GLUCOSE 144 (H) 10/26/2020   NA 138 10/26/2020   K 4.6 10/26/2020   BUN 18 10/26/2020   CREATININE 0.90 10/26/2020   GFRNONAA >60 10/26/2020   GFRAA 79 03/30/2020   CALCIUM 8.7 (L) 10/26/2020   AST 26 10/14/2020   ALT 13 10/14/2020   The ASCVD Risk score (Arnett DK, et al., 2019)  failed to calculate for the following reasons:   Unable to determine if patient is Non-Hispanic African American  ---------------------------------------------------------------------------------------------------     Medications: Outpatient Medications Prior to Visit  Medication Sig   beclomethasone (QVAR) 80 MCG/ACT inhaler Inhale 2 puffs into the lungs 2 (two) times daily as needed (shortness of breath).   docusate sodium (COLACE) 100 MG capsule Take 1 capsule (100 mg total) by mouth 2 (two) times daily.   doxepin (SINEQUAN) 10 MG capsule Take 10 mg by mouth daily as needed (hives).   losartan (COZAAR) 50 MG tablet TAKE 1 TABLET BY MOUTH EVERY DAY (Patient taking differently: Take 50 mg by mouth daily.)   montelukast (SINGULAIR) 10 MG tablet TAKE 1 TABLET (10 MG TOTAL) BY MOUTH DAILY.   NON FORMULARY CPAP at night   pantoprazole (PROTONIX) 40 MG tablet Take 40 mg by mouth 2 (two) times daily.   rosuvastatin (CRESTOR) 10 MG tablet Take 1 tablet (10 mg total) by mouth daily.   traMADol (ULTRAM) 50 MG tablet Take 1 tablet (50 mg total) by mouth every 6 (six) hours as needed.   verapamil (CALAN-SR) 240 MG CR tablet TAKE 1 TABLET BY MOUTH EVERY DAY (Patient taking differently: Take 240 mg by mouth daily.)   Vitamin D, Ergocalciferol, (DRISDOL) 1.25 MG (50000 UNIT) CAPS capsule TAKE 1 CAPSULE (50,000 UNITS TOTAL) BY MOUTH EVERY 7 (SEVEN) DAYS. (TAKING ONE TABLET PER WEEK)   enoxaparin (LOVENOX) 40 MG/0.4ML injection Inject 0.4 mLs (  40 mg total) into the skin daily for 14 days.   HYDROcodone-acetaminophen (NORCO) 7.5-325 MG tablet Take 1-2 tablets by mouth every 4 (four) hours as needed for severe pain (pain score 7-10).   meclizine (ANTIVERT) 25 MG tablet Take 1 tablet (25 mg total) by mouth 3 (three) times daily as needed for dizziness.   methocarbamol (ROBAXIN) 500 MG tablet Take 1 tablet (500 mg total) by mouth every 6 (six) hours as needed for muscle spasms.   No facility-administered  medications prior to visit.    Review of Systems  Constitutional: Negative.  Negative for appetite change, chills, fatigue and fever.  HENT:  Positive for ear pain (Bilateral). Negative for ear discharge.   Respiratory: Negative.  Negative for chest tightness and shortness of breath.   Cardiovascular:  Negative for chest pain and palpitations.  Gastrointestinal:  Positive for diarrhea (Comes and goes.). Negative for abdominal distention, abdominal pain, anal bleeding, blood in stool, constipation, nausea, rectal pain and vomiting.  Allergic/Immunologic: Positive for environmental allergies.  Neurological:  Negative for dizziness, weakness, light-headedness and headaches.      Objective    BP 119/72 (BP Location: Left Arm, Patient Position: Sitting, Cuff Size: Large)   Pulse 83   Temp 98.4 F (36.9 C) (Oral)   Wt 261 lb (118.4 kg)   BMI 43.43 kg/m    Physical Exam Vitals reviewed.  Constitutional:      Appearance: She is obese.  HENT:     Head: Normocephalic and atraumatic.     Right Ear: External ear normal.     Left Ear: External ear normal.  Eyes:     General: No scleral icterus.    Conjunctiva/sclera: Conjunctivae normal.  Cardiovascular:     Rate and Rhythm: Normal rate and regular rhythm.     Pulses: Normal pulses.     Heart sounds: Normal heart sounds.  Pulmonary:     Effort: Pulmonary effort is normal.     Breath sounds: Normal breath sounds.  Abdominal:     Palpations: Abdomen is soft.  Musculoskeletal:     Right lower leg: Edema present.     Left lower leg: Edema present.     Comments: Trace edema  Neurological:     General: No focal deficit present.     Mental Status: She is alert and oriented to person, place, and time.  Psychiatric:        Mood and Affect: Mood normal.        Behavior: Behavior normal.        Thought Content: Thought content normal.        Judgment: Judgment normal.      No results found for any visits on 01/26/21.  Assessment &  Plan     1. Essential (primary) hypertension  Decrease verapamil from 240 to 180 mg daily.  Continue losartan 50  2. Hyperlipidemia, unspecified hyperlipidemia type Follow-up lipids.  Continue to treat current due to vascular disease found on x-rays and carotid  3. Need for influenza vaccination  - Flu Vaccine QUAD High Dose(Fluad)  4. Morbid obesity ([place]) Diet and exercise discussed.  She has lost some weight since her last visit.  5. Other iron deficiency anemia   6. B12 deficiency   7. S/P TKR (total knee replacement) using cement, right Doing well status post recent TKR  8. Venous insufficiency of both lower extremities Consider referral to vascular.  Possibly decreasing verapamil will help.  Possibly stop in the future.   No  follow-ups on file.      I, Wilhemena Durie, MD, have reviewed all documentation for this visit. The documentation on 01/27/21 for the exam, diagnosis, procedures, and orders are all accurate and complete.    Amahia Madonia Cranford Mon, MD  [facility] [phone] (phone) [phone] (fax)  [place]

## 2021-02-03 DIAGNOSIS — M7631 Iliotibial band syndrome, right leg: Secondary | ICD-10-CM | POA: Diagnosis not present

## 2021-02-03 DIAGNOSIS — Z96651 Presence of right artificial knee joint: Secondary | ICD-10-CM | POA: Diagnosis not present

## 2021-02-03 DIAGNOSIS — M25561 Pain in right knee: Secondary | ICD-10-CM | POA: Diagnosis not present

## 2021-02-03 DIAGNOSIS — M1711 Unilateral primary osteoarthritis, right knee: Secondary | ICD-10-CM | POA: Diagnosis not present

## 2021-02-09 DIAGNOSIS — J0140 Acute pansinusitis, unspecified: Secondary | ICD-10-CM | POA: Diagnosis not present

## 2021-04-11 DIAGNOSIS — L718 Other rosacea: Secondary | ICD-10-CM | POA: Diagnosis not present

## 2021-04-24 ENCOUNTER — Other Ambulatory Visit: Payer: Self-pay | Admitting: Family Medicine

## 2021-04-25 NOTE — Telephone Encounter (Signed)
Requested Prescriptions  Pending Prescriptions Disp Refills   losartan (COZAAR) 50 MG tablet [Pharmacy Med Name: LOSARTAN POTASSIUM 50 MG TAB] 90 tablet 3    Sig: TAKE 1 TABLET BY MOUTH EVERY DAY     Cardiovascular:  Angiotensin Receptor Blockers Passed - 04/24/2021  3:16 PM      Passed - Cr in normal range and within 180 days    Creatinine, Ser  Date Value Ref Range Status  10/26/2020 0.90 0.44 - 1.00 mg/dL Final         Passed - K in normal range and within 180 days    Potassium  Date Value Ref Range Status  10/26/2020 4.6 3.5 - 5.1 mmol/L Final         Passed - Patient is not pregnant      Passed - Last BP in normal range    BP Readings from Last 1 Encounters:  01/26/21 119/72         Passed - Valid encounter within last 6 months    Recent Outpatient Visits          2 months ago Essential (primary) hypertension   [facility] Jerrol Banana., MD   9 months ago Essential (primary) hypertension   [facility] Jerrol Banana., MD   1 year ago Essential hypertension   [facility] Jerrol Banana., MD   1 year ago Urinary tract infection without hematuria, site unspecified   [facility] Flinchum, Kelby Aline, FNP   1 year ago Spasm of lumbar paraspinous muscle   [facility] Flinchum, Kelby Aline, FNP

## 2021-04-27 DIAGNOSIS — H353131 Nonexudative age-related macular degeneration, bilateral, early dry stage: Secondary | ICD-10-CM | POA: Diagnosis not present

## 2021-06-12 DIAGNOSIS — M1711 Unilateral primary osteoarthritis, right knee: Secondary | ICD-10-CM | POA: Diagnosis not present

## 2021-06-12 DIAGNOSIS — M1712 Unilateral primary osteoarthritis, left knee: Secondary | ICD-10-CM | POA: Diagnosis not present

## 2021-06-12 DIAGNOSIS — Z96651 Presence of right artificial knee joint: Secondary | ICD-10-CM | POA: Diagnosis not present

## 2021-06-15 ENCOUNTER — Telehealth: Payer: Self-pay | Admitting: Family Medicine

## 2021-06-15 ENCOUNTER — Other Ambulatory Visit: Payer: Self-pay | Admitting: Orthopedic Surgery

## 2021-06-15 NOTE — Telephone Encounter (Signed)
Pt is calling to report that she would like to reschedule her AWV to 6-8 weeks out. Pt will be having surgery. Please advise CB- [phone]

## 2021-06-22 ENCOUNTER — Encounter
Admission: RE | Admit: 2021-06-22 | Discharge: 2021-06-22 | Disposition: A | Discharge status: Home / Self Care | Payer: PPO | Source: Ambulatory Visit | Attending: Orthopedic Surgery | Admitting: Orthopedic Surgery

## 2021-06-22 ENCOUNTER — Other Ambulatory Visit: Payer: Self-pay

## 2021-06-22 ENCOUNTER — Encounter: Payer: Self-pay | Admitting: Orthopedic Surgery

## 2021-06-22 VITALS — BP 136/73 | Resp 18 | Ht 65.0 in | Wt 259.7 lb

## 2021-06-22 DIAGNOSIS — Z01818 Encounter for other preprocedural examination: Secondary | ICD-10-CM | POA: Diagnosis not present

## 2021-06-22 DIAGNOSIS — Z01812 Encounter for preprocedural laboratory examination: Secondary | ICD-10-CM

## 2021-06-22 DIAGNOSIS — R8290 Unspecified abnormal findings in urine: Secondary | ICD-10-CM | POA: Insufficient documentation

## 2021-06-22 LAB — URINALYSIS, ROUTINE W REFLEX MICROSCOPIC
Bacteria, UA: NONE SEEN
Bilirubin Urine: NEGATIVE
Glucose, UA: NEGATIVE mg/dL
Hgb urine dipstick: NEGATIVE
Ketones, ur: NEGATIVE mg/dL
Nitrite: NEGATIVE
Protein, ur: NEGATIVE mg/dL
Specific Gravity, Urine: 1.016 (ref 1.005–1.030)
pH: 6 (ref 5.0–8.0)

## 2021-06-22 LAB — COMPREHENSIVE METABOLIC PANEL
ALT: 13 U/L (ref 0–44)
AST: 28 U/L (ref 15–41)
Albumin: 3.6 g/dL (ref 3.5–5.0)
Alkaline Phosphatase: 87 U/L (ref 38–126)
Anion gap: 8 (ref 5–15)
BUN: 18 mg/dL (ref 8–23)
CO2: 26 mmol/L (ref 22–32)
Calcium: 9.2 mg/dL (ref 8.9–10.3)
Chloride: 103 mmol/L (ref 98–111)
Creatinine, Ser: 0.89 mg/dL (ref 0.44–1.00)
GFR, Estimated: >60 mL/min (ref >60)
Glucose, Bld: 97 mg/dL (ref 70–99)
Potassium: 4.1 mmol/L (ref 3.5–5.1)
Sodium: 137 mmol/L (ref 135–145)
Total Bilirubin: 1.1 mg/dL (ref 0.3–1.2)
Total Protein: 6.8 g/dL (ref 6.5–8.1)

## 2021-06-22 LAB — SURGICAL PCR SCREEN
MRSA, PCR: NEGATIVE
Staphylococcus aureus: NEGATIVE

## 2021-06-22 LAB — CBC WITH DIFFERENTIAL/PLATELET
Abs Immature Granulocytes: 0.01 10*3/uL (ref 0.00–0.07)
Basophils Absolute: 0 10*3/uL (ref 0.0–0.1)
Basophils Relative: 1 %
Eosinophils Absolute: 0.4 10*3/uL (ref 0.0–0.5)
Eosinophils Relative: 7 %
HCT: 37.4 % (ref 36.0–46.0)
Hemoglobin: 12.3 g/dL (ref 12.0–15.0)
Immature Granulocytes: 0 %
Lymphocytes Relative: 24 %
Lymphs Abs: 1.5 10*3/uL (ref 0.7–4.0)
MCH: 26.4 pg (ref 26.0–34.0)
MCHC: 32.9 g/dL (ref 30.0–36.0)
MCV: 80.3 fL (ref 80.0–100.0)
Monocytes Absolute: 0.5 10*3/uL (ref 0.1–1.0)
Monocytes Relative: 7 %
Neutro Abs: 3.9 10*3/uL (ref 1.7–7.7)
Neutrophils Relative %: 61 %
Platelets: 178 10*3/uL (ref 150–400)
RBC: 4.66 MIL/uL (ref 3.87–5.11)
RDW: 15.2 % (ref 11.5–15.5)
WBC: 6.3 10*3/uL (ref 4.0–10.5)
nRBC: 0 % (ref 0.0–0.2)

## 2021-06-22 LAB — TYPE AND SCREEN
ABO/RH(D): B NEG
Antibody Screen: NEGATIVE

## 2021-06-22 NOTE — Patient Instructions (Addendum)
Your procedure is scheduled on: 07/06/21 - Thursday Report to the Registration Desk on the 1st floor of the [facility]. To find out your arrival time, please call [phone] between 1PM - 3PM on: 07/05/21 - Wednesday Report to [facility] for Covid test on 07/04/21 between 8 am and 12 noon.  REMEMBER: Instructions that are not followed completely may result in serious medical risk, up to and including death; or upon the discretion of your surgeon and anesthesiologist your surgery may need to be rescheduled.  Do not eat food after midnight the night before surgery.  No gum chewing, lozengers or hard candies.  You may however, drink CLEAR liquids up to 2 hours before you are scheduled to arrive for your surgery. Do not drink anything within 2 hours of your scheduled arrival time.  Clear liquids include: - water  - apple juice without pulp - gatorade (not RED colors) - black coffee or tea (Do NOT add milk or creamers to the coffee or tea) Do NOT drink anything that is not on this list.  In addition, your doctor has ordered for you to drink the provided  Ensure Pre-Surgery Clear Carbohydrate Drink  Drinking this carbohydrate drink up to two hours before surgery helps to reduce insulin resistance and improve patient outcomes. Please complete drinking 2 hours prior to scheduled arrival time.  TAKE THESE MEDICATIONS THE MORNING OF SURGERY WITH A SIP OF WATER:-   - pantoprazole (PROTONIX) 40 MG tablet, (take one the night before and one on the morning of surgery - helps to prevent nausea after surgery.) - montelukast (SINGULAIR) 10 MG tablet  Use beclomethasone (QVAR) 80 MCG/ACT inhaler on the day of surgery and bring to the hospital.  One week prior to surgery: Stop Anti-inflammatories (NSAIDS) such as Advil, Aleve, Ibuprofen, Motrin, Naproxen, Naprosyn and Aspirin based products such as Excedrin, Goodys Powder, BC Powder.  Stop ANY OVER THE COUNTER supplements until after  surgery beginning 06/28/21.   You may take Tylenol if needed for pain up until the day of surgery.  No Alcohol for 24 hours before or after surgery.  No Smoking including e-cigarettes for 24 hours prior to surgery.  No chewable tobacco products for at least 6 hours prior to surgery.  No nicotine patches on the day of surgery.  Do not use any "recreational" drugs for at least a week prior to your surgery.  Please be advised that the combination of cocaine and anesthesia may have negative outcomes, up to and including death. If you test positive for cocaine, your surgery will be cancelled.  On the morning of surgery brush your teeth with toothpaste and water, you may rinse your mouth with mouthwash if you wish. Do not swallow any toothpaste or mouthwash.  Use CHG Soap or wipes as directed on instruction sheet.  Do not wear jewelry, make-up, hairpins, clips or nail polish.  Do not wear lotions, powders, or perfumes.   Do not shave body from the neck down 48 hours prior to surgery just in case you cut yourself which could leave a site for infection.  Also, freshly shaved skin may become irritated if using the CHG soap.  Contact lenses, hearing aids and dentures may not be worn into surgery.  Do not bring valuables to the hospital. [facility] is not responsible for any missing/lost belongings or valuables.   Notify your doctor if there is any change in your medical condition (cold, fever, infection).  Wear comfortable clothing (specific to your  surgery type) to the hospital.  After surgery, you can help prevent lung complications by doing breathing exercises.  Take deep breaths and cough every 1-2 hours. Your doctor may order a device called an Incentive Spirometer to help you take deep breaths. When coughing or sneezing, hold a pillow firmly against your incision with both hands. This is called splinting. Doing this helps protect your incision. It also decreases belly  discomfort.  If you are being admitted to the hospital overnight, leave your suitcase in the car. After surgery it may be brought to your room.  If you are being discharged the day of surgery, you will not be allowed to drive home. You will need a responsible adult (18 years or older) to drive you home and stay with you that night.   If you are taking public transportation, you will need to have a responsible adult (18 years or older) with you. Please confirm with your physician that it is acceptable to use public transportation.   Please call the [facility] Dept. at [phone] if you have any questions about these instructions.  Surgery Visitation Policy:  Patients undergoing a surgery or procedure may have one family member or support person with them as long as that person is not COVID-19 positive or experiencing its symptoms.  That person may remain in the waiting area during the procedure and may rotate out with other people.  Inpatient Visitation:    Visiting hours are 7 a.m. to 8 p.m. Up to two visitors ages 16+ are allowed at one time in a patient room. The visitors may rotate out with other people during the day. Visitors must check out when they leave, or other visitors will not be allowed. One designated support person may remain overnight. The visitor must pass COVID-19 screenings, use hand sanitizer when entering and exiting the patients room and wear a mask at all times, including in the patients room. Patients must also wear a mask when staff or their visitor are in the room. Masking is required regardless of vaccination status.

## 2021-06-24 LAB — URINE CULTURE: Culture: 10000 — AB

## 2021-07-04 ENCOUNTER — Other Ambulatory Visit
Admission: RE | Admit: 2021-07-04 | Discharge: 2021-07-04 | Disposition: A | Discharge status: Home / Self Care | Payer: PPO | Source: Ambulatory Visit | Attending: Orthopedic Surgery | Admitting: Orthopedic Surgery

## 2021-07-04 ENCOUNTER — Other Ambulatory Visit: Payer: Self-pay

## 2021-07-04 DIAGNOSIS — Z20822 Contact with and (suspected) exposure to covid-19: Secondary | ICD-10-CM | POA: Diagnosis not present

## 2021-07-04 DIAGNOSIS — Z01812 Encounter for preprocedural laboratory examination: Secondary | ICD-10-CM | POA: Diagnosis not present

## 2021-07-04 LAB — SARS CORONAVIRUS 2 (TAT 6-24 HRS): SARS Coronavirus 2: NEGATIVE

## 2021-07-06 ENCOUNTER — Observation Stay
Admission: RE | Admit: 2021-07-06 | Discharge: 2021-07-07 | Disposition: A | Discharge status: Home Health | Payer: PPO | Attending: Orthopedic Surgery | Admitting: Orthopedic Surgery

## 2021-07-06 ENCOUNTER — Other Ambulatory Visit: Payer: Self-pay

## 2021-07-06 ENCOUNTER — Ambulatory Visit: Payer: PPO | Admitting: Urgent Care

## 2021-07-06 ENCOUNTER — Encounter: Payer: Self-pay | Admitting: Orthopedic Surgery

## 2021-07-06 ENCOUNTER — Observation Stay: Payer: PPO

## 2021-07-06 ENCOUNTER — Encounter
Admission: RE | Disposition: A | Discharge status: Home Health | Payer: Self-pay | Source: Home / Self Care | Attending: Orthopedic Surgery

## 2021-07-06 DIAGNOSIS — M1712 Unilateral primary osteoarthritis, left knee: Principal | ICD-10-CM | POA: Insufficient documentation

## 2021-07-06 DIAGNOSIS — Z87891 Personal history of nicotine dependence: Secondary | ICD-10-CM | POA: Insufficient documentation

## 2021-07-06 DIAGNOSIS — Z471 Aftercare following joint replacement surgery: Secondary | ICD-10-CM | POA: Diagnosis not present

## 2021-07-06 DIAGNOSIS — Z96651 Presence of right artificial knee joint: Secondary | ICD-10-CM | POA: Insufficient documentation

## 2021-07-06 DIAGNOSIS — Z91040 Latex allergy status: Secondary | ICD-10-CM | POA: Diagnosis not present

## 2021-07-06 DIAGNOSIS — Z96652 Presence of left artificial knee joint: Secondary | ICD-10-CM

## 2021-07-06 DIAGNOSIS — J45909 Unspecified asthma, uncomplicated: Secondary | ICD-10-CM | POA: Insufficient documentation

## 2021-07-06 DIAGNOSIS — I2510 Atherosclerotic heart disease of native coronary artery without angina pectoris: Secondary | ICD-10-CM | POA: Diagnosis not present

## 2021-07-06 DIAGNOSIS — Z79899 Other long term (current) drug therapy: Secondary | ICD-10-CM | POA: Diagnosis not present

## 2021-07-06 DIAGNOSIS — Z85828 Personal history of other malignant neoplasm of skin: Secondary | ICD-10-CM | POA: Insufficient documentation

## 2021-07-06 DIAGNOSIS — I10 Essential (primary) hypertension: Secondary | ICD-10-CM | POA: Diagnosis not present

## 2021-07-06 DIAGNOSIS — Z85038 Personal history of other malignant neoplasm of large intestine: Secondary | ICD-10-CM | POA: Diagnosis not present

## 2021-07-06 DIAGNOSIS — G8918 Other acute postprocedural pain: Secondary | ICD-10-CM

## 2021-07-06 HISTORY — DX: Basal cell carcinoma of skin, unspecified: C44.91

## 2021-07-06 HISTORY — DX: Obstructive sleep apnea (adult) (pediatric): G47.33

## 2021-07-06 HISTORY — PX: TOTAL KNEE ARTHROPLASTY: SHX125

## 2021-07-06 LAB — CBC
HCT: 33.2 % — ABNORMAL LOW (ref 36.0–46.0)
Hemoglobin: 10.7 g/dL — ABNORMAL LOW (ref 12.0–15.0)
MCH: 26.2 pg (ref 26.0–34.0)
MCHC: 32.2 g/dL (ref 30.0–36.0)
MCV: 81.4 fL (ref 80.0–100.0)
Platelets: 140 10*3/uL — ABNORMAL LOW (ref 150–400)
RBC: 4.08 MIL/uL (ref 3.87–5.11)
RDW: 15.7 % — ABNORMAL HIGH (ref 11.5–15.5)
WBC: 10.8 10*3/uL — ABNORMAL HIGH (ref 4.0–10.5)
nRBC: 0 % (ref 0.0–0.2)

## 2021-07-06 LAB — CREATININE, SERUM
Creatinine, Ser: 0.83 mg/dL (ref 0.44–1.00)
GFR, Estimated: >60 mL/min (ref >60)

## 2021-07-06 SURGERY — ARTHROPLASTY, KNEE, TOTAL
Anesthesia: Spinal | Site: Knee | Laterality: Left

## 2021-07-06 MED ORDER — HYDROCODONE-ACETAMINOPHEN 5-325 MG PO TABS
ORAL_TABLET | ORAL | Status: AC
  Filled 2021-07-06: qty 2

## 2021-07-06 MED ORDER — PANTOPRAZOLE SODIUM 40 MG PO TBEC: 40.0000 mg | DELAYED_RELEASE_TABLET | Freq: Two times a day (BID) | ORAL | Status: DC

## 2021-07-06 MED ORDER — METHOCARBAMOL 500 MG PO TABS
ORAL_TABLET | ORAL | Status: AC
  Administered 2021-07-06: 17:00:00 500 mg via ORAL
  Filled 2021-07-06: qty 1

## 2021-07-06 MED ORDER — HYDROCODONE-ACETAMINOPHEN 5-325 MG PO TABS
1.0000 | ORAL_TABLET | ORAL | Status: DC | PRN
  Administered 2021-07-06: 12:00:00 2 via ORAL

## 2021-07-06 MED ORDER — EPHEDRINE 5 MG/ML INJ
INTRAVENOUS | Status: AC
  Filled 2021-07-06: qty 5

## 2021-07-06 MED ORDER — GLYCOPYRROLATE 0.2 MG/ML IJ SOLN
INTRAMUSCULAR | Status: AC
  Filled 2021-07-06: qty 1

## 2021-07-06 MED ORDER — METHOCARBAMOL 500 MG PO TABS
ORAL_TABLET | ORAL | Status: AC
  Filled 2021-07-06: qty 1

## 2021-07-06 MED ORDER — PANTOPRAZOLE SODIUM 40 MG PO TBEC
DELAYED_RELEASE_TABLET | ORAL | Status: AC
  Administered 2021-07-06: 21:00:00 40 mg via ORAL
  Filled 2021-07-06: qty 1

## 2021-07-06 MED ORDER — DIPHENHYDRAMINE HCL 12.5 MG/5ML PO ELIX: 12.5000 mg | ORAL_SOLUTION | ORAL | Status: DC | PRN

## 2021-07-06 MED ORDER — CHLORHEXIDINE GLUCONATE 0.12 % MT SOLN: 15.0000 mL | Freq: Once | OROMUCOSAL | Status: AC

## 2021-07-06 MED ORDER — BUPIVACAINE HCL (PF) 0.5 % IJ SOLN
INTRAMUSCULAR | Status: AC
  Filled 2021-07-06: qty 10

## 2021-07-06 MED ORDER — MORPHINE SULFATE (PF) 2 MG/ML IV SOLN
0.5000 mg | INTRAVENOUS | Status: DC | PRN
  Administered 2021-07-06: 1 mg via INTRAVENOUS

## 2021-07-06 MED ORDER — SODIUM CHLORIDE FLUSH 0.9 % IV SOLN
INTRAVENOUS | Status: AC
Start: 2021-07-06 — End: ?
  Filled 2021-07-06: qty 40

## 2021-07-06 MED ORDER — CEFAZOLIN SODIUM-DEXTROSE 2-4 GM/100ML-% IV SOLN
2.0000 g | INTRAVENOUS | Status: AC
  Administered 2021-07-06: 08:00:00 3 g via INTRAVENOUS

## 2021-07-06 MED ORDER — DOCUSATE SODIUM 100 MG PO CAPS
ORAL_CAPSULE | ORAL | Status: AC
  Administered 2021-07-06: 21:00:00 100 mg via ORAL
  Filled 2021-07-06: qty 1

## 2021-07-06 MED ORDER — HYDROCODONE-ACETAMINOPHEN 7.5-325 MG PO TABS: 1.0000 | ORAL_TABLET | ORAL | Status: DC | PRN

## 2021-07-06 MED ORDER — HYDROCODONE-ACETAMINOPHEN 7.5-325 MG PO TABS
ORAL_TABLET | ORAL | Status: AC
  Administered 2021-07-06: 21:00:00 2 via ORAL
  Filled 2021-07-06: qty 2

## 2021-07-06 MED ORDER — SODIUM CHLORIDE (PF) 0.9 % IJ SOLN
INTRAMUSCULAR | Status: DC | PRN
  Administered 2021-07-06: 91 mL via INTRAMUSCULAR

## 2021-07-06 MED ORDER — SODIUM CHLORIDE 0.9 % IR SOLN: Status: DC | PRN

## 2021-07-06 MED ORDER — ONDANSETRON HCL 4 MG/2ML IJ SOLN
INTRAMUSCULAR | Status: AC
  Filled 2021-07-06: qty 2

## 2021-07-06 MED ORDER — LOSARTAN POTASSIUM 50 MG PO TABS
50.0000 mg | ORAL_TABLET | Freq: Every day | ORAL | Status: DC
  Administered 2021-07-06 – 2021-07-07 (×2): 50 mg via ORAL
  Filled 2021-07-06 (×2): qty 1

## 2021-07-06 MED ORDER — PHENYLEPHRINE HCL (PRESSORS) 10 MG/ML IV SOLN
INTRAVENOUS | Status: DC | PRN
  Administered 2021-07-06: 200 ug via INTRAVENOUS
  Administered 2021-07-06: 100 ug via INTRAVENOUS
  Administered 2021-07-06 (×3): 80 ug via INTRAVENOUS
  Administered 2021-07-06: 100 ug via INTRAVENOUS
  Administered 2021-07-06 (×2): 200 ug via INTRAVENOUS
  Administered 2021-07-06 (×2): 80 ug via INTRAVENOUS

## 2021-07-06 MED ORDER — ZOLPIDEM TARTRATE 5 MG PO TABS: 5.0000 mg | ORAL_TABLET | Freq: Every evening | ORAL | Status: DC | PRN

## 2021-07-06 MED ORDER — PROPOFOL 10 MG/ML IV BOLUS
INTRAVENOUS | Status: DC | PRN
  Administered 2021-07-06: 20 mg via INTRAVENOUS

## 2021-07-06 MED ORDER — ONDANSETRON HCL 4 MG/2ML IJ SOLN
4.0000 mg | Freq: Four times a day (QID) | INTRAMUSCULAR | Status: DC | PRN
  Administered 2021-07-06: 17:00:00 4 mg via INTRAVENOUS

## 2021-07-06 MED ORDER — TRAMADOL HCL 50 MG PO TABS
ORAL_TABLET | ORAL | Status: AC
  Administered 2021-07-06: 12:00:00 50 mg via ORAL
  Filled 2021-07-06: qty 1

## 2021-07-06 MED ORDER — DOCUSATE SODIUM 100 MG PO CAPS: 100.0000 mg | ORAL_CAPSULE | Freq: Two times a day (BID) | ORAL | Status: DC

## 2021-07-06 MED ORDER — MENTHOL 3 MG MT LOZG
1.0000 | LOZENGE | OROMUCOSAL | Status: DC | PRN
  Filled 2021-07-06: qty 9

## 2021-07-06 MED ORDER — SURGIPHOR WOUND IRRIGATION SYSTEM - OPTIME: TOPICAL | Status: DC | PRN

## 2021-07-06 MED ORDER — FENTANYL CITRATE (PF) 100 MCG/2ML IJ SOLN
INTRAMUSCULAR | Status: AC
  Filled 2021-07-06: qty 2

## 2021-07-06 MED ORDER — LIDOCAINE HCL (PF) 2 % IJ SOLN
INTRAMUSCULAR | Status: DC | PRN
Start: 2021-07-06 — End: 2021-07-06
  Administered 2021-07-06: 25 mg

## 2021-07-06 MED ORDER — BUPIVACAINE-EPINEPHRINE (PF) 0.25% -1:200000 IJ SOLN
INTRAMUSCULAR | Status: AC
  Filled 2021-07-06: qty 30

## 2021-07-06 MED ORDER — OXYCODONE HCL 5 MG/5ML PO SOLN: 5.0000 mg | Freq: Once | ORAL | Status: DC | PRN

## 2021-07-06 MED ORDER — BUDESONIDE 0.5 MG/2ML IN SUSP
0.5000 mg | Freq: Two times a day (BID) | RESPIRATORY_TRACT | Status: DC
  Administered 2021-07-06 – 2021-07-07 (×3): 0.5 mg via RESPIRATORY_TRACT
  Filled 2021-07-06 (×4): qty 2

## 2021-07-06 MED ORDER — VERAPAMIL HCL ER 180 MG PO TBCR
180.0000 mg | EXTENDED_RELEASE_TABLET | Freq: Every day | ORAL | Status: DC
  Administered 2021-07-06 – 2021-07-07 (×2): 180 mg via ORAL
  Filled 2021-07-06 (×2): qty 1

## 2021-07-06 MED ORDER — HYDROCODONE-ACETAMINOPHEN 7.5-325 MG PO TABS
ORAL_TABLET | ORAL | Status: AC
  Administered 2021-07-06: 17:00:00 1 via ORAL
  Filled 2021-07-06: qty 1

## 2021-07-06 MED ORDER — METHOCARBAMOL 500 MG PO TABS
500.0000 mg | ORAL_TABLET | Freq: Four times a day (QID) | ORAL | Status: DC | PRN
  Administered 2021-07-06: 12:00:00 500 mg via ORAL

## 2021-07-06 MED ORDER — ALUM & MAG HYDROXIDE-SIMETH 200-200-20 MG/5ML PO SUSP: 30.0000 mL | ORAL | Status: DC | PRN

## 2021-07-06 MED ORDER — FENTANYL CITRATE (PF) 100 MCG/2ML IJ SOLN
INTRAMUSCULAR | Status: DC | PRN
  Administered 2021-07-06 (×2): 50 ug via INTRAVENOUS

## 2021-07-06 MED ORDER — OCUVITE-LUTEIN PO CAPS
2.0000 | ORAL_CAPSULE | Freq: Every day | ORAL | Status: DC
  Administered 2021-07-06: 21:00:00 2 via ORAL
  Filled 2021-07-06 (×2): qty 2

## 2021-07-06 MED ORDER — CHLORHEXIDINE GLUCONATE 0.12 % MT SOLN
OROMUCOSAL | Status: AC
  Administered 2021-07-06: 07:00:00 15 mL via OROMUCOSAL
  Filled 2021-07-06: qty 15

## 2021-07-06 MED ORDER — MONTELUKAST SODIUM 10 MG PO TABS
10.0000 mg | ORAL_TABLET | Freq: Every day | ORAL | Status: DC
  Administered 2021-07-06 – 2021-07-07 (×2): 10 mg via ORAL
  Filled 2021-07-06 (×2): qty 1

## 2021-07-06 MED ORDER — BISACODYL 5 MG PO TBEC
5.0000 mg | DELAYED_RELEASE_TABLET | Freq: Every day | ORAL | Status: DC | PRN
  Filled 2021-07-06: qty 1

## 2021-07-06 MED ORDER — MIDAZOLAM HCL 2 MG/2ML IJ SOLN
INTRAMUSCULAR | Status: AC
  Filled 2021-07-06: qty 2

## 2021-07-06 MED ORDER — METHOCARBAMOL 1000 MG/10ML IJ SOLN
500.0000 mg | Freq: Four times a day (QID) | INTRAVENOUS | Status: DC | PRN
  Administered 2021-07-07: 500 mg via INTRAVENOUS
  Filled 2021-07-06 (×3): qty 5

## 2021-07-06 MED ORDER — MAGNESIUM HYDROXIDE 400 MG/5ML PO SUSP: 30.0000 mL | Freq: Every day | ORAL | Status: DC

## 2021-07-06 MED ORDER — ENOXAPARIN SODIUM 30 MG/0.3ML IJ SOSY: 30.0000 mg | PREFILLED_SYRINGE | Freq: Two times a day (BID) | INTRAMUSCULAR | Status: DC

## 2021-07-06 MED ORDER — CEFAZOLIN SODIUM-DEXTROSE 2-4 GM/100ML-% IV SOLN
INTRAVENOUS | Status: AC
  Administered 2021-07-06: 17:00:00 2 g via INTRAVENOUS
  Filled 2021-07-06: qty 100

## 2021-07-06 MED ORDER — VITAMIN D (ERGOCALCIFEROL) 1.25 MG (50000 UNIT) PO CAPS: 50000.0000 [IU] | ORAL_CAPSULE | ORAL | Status: DC

## 2021-07-06 MED ORDER — PHENOL 1.4 % MT LIQD
1.0000 | OROMUCOSAL | Status: DC | PRN
  Filled 2021-07-06: qty 177

## 2021-07-06 MED ORDER — METOCLOPRAMIDE HCL 5 MG PO TABS
5.0000 mg | ORAL_TABLET | Freq: Three times a day (TID) | ORAL | Status: DC | PRN
  Filled 2021-07-06: qty 2

## 2021-07-06 MED ORDER — MIDAZOLAM HCL 5 MG/5ML IJ SOLN
INTRAMUSCULAR | Status: DC | PRN
  Administered 2021-07-06 (×2): 1 mg via INTRAVENOUS
  Administered 2021-07-06: 2 mg via INTRAVENOUS

## 2021-07-06 MED ORDER — MAGNESIUM HYDROXIDE 400 MG/5ML PO SUSP
ORAL | Status: AC
  Filled 2021-07-06: qty 30

## 2021-07-06 MED ORDER — METOCLOPRAMIDE HCL 5 MG/ML IJ SOLN: 5.0000 mg | Freq: Three times a day (TID) | INTRAMUSCULAR | Status: DC | PRN

## 2021-07-06 MED ORDER — NYSTATIN 100000 UNIT/GM EX CREA
1.0000 "application " | TOPICAL_CREAM | Freq: Two times a day (BID) | CUTANEOUS | Status: DC
  Filled 2021-07-06: qty 30

## 2021-07-06 MED ORDER — NYSTATIN 100000 UNIT/ML MT SUSP
5.0000 mL | Freq: Two times a day (BID) | OROMUCOSAL | Status: DC
  Administered 2021-07-06 – 2021-07-07 (×2): 500000 [IU] via OROMUCOSAL
  Filled 2021-07-06 (×4): qty 5

## 2021-07-06 MED ORDER — TRAMADOL HCL 50 MG PO TABS
50.0000 mg | ORAL_TABLET | Freq: Four times a day (QID) | ORAL | Status: DC
  Administered 2021-07-06: 18:00:00 50 mg via ORAL

## 2021-07-06 MED ORDER — SODIUM CHLORIDE 0.9 % IV SOLN: INTRAVENOUS | Status: DC

## 2021-07-06 MED ORDER — FENTANYL CITRATE (PF) 100 MCG/2ML IJ SOLN: 25.0000 ug | INTRAMUSCULAR | Status: DC | PRN

## 2021-07-06 MED ORDER — DOCUSATE SODIUM 100 MG PO CAPS
ORAL_CAPSULE | ORAL | Status: AC
  Administered 2021-07-06: 12:00:00 100 mg via ORAL
  Filled 2021-07-06: qty 1

## 2021-07-06 MED ORDER — MORPHINE SULFATE (PF) 4 MG/ML IV SOLN
0.5000 mg | INTRAVENOUS | Status: DC | PRN
  Administered 2021-07-06 (×2): 1 mg via INTRAVENOUS

## 2021-07-06 MED ORDER — ONDANSETRON HCL 4 MG PO TABS
4.0000 mg | ORAL_TABLET | Freq: Four times a day (QID) | ORAL | Status: DC | PRN
  Filled 2021-07-06: qty 1

## 2021-07-06 MED ORDER — MORPHINE SULFATE (PF) 10 MG/ML IV SOLN
INTRAVENOUS | Status: AC
  Filled 2021-07-06: qty 1

## 2021-07-06 MED ORDER — CEFAZOLIN SODIUM-DEXTROSE 2-4 GM/100ML-% IV SOLN: 2.0000 g | Freq: Four times a day (QID) | INTRAVENOUS | Status: AC

## 2021-07-06 MED ORDER — OXYCODONE HCL 5 MG PO TABS: 5.0000 mg | ORAL_TABLET | Freq: Once | ORAL | Status: DC | PRN

## 2021-07-06 MED ORDER — LACTATED RINGERS IV SOLN: INTRAVENOUS | Status: DC

## 2021-07-06 MED ORDER — POLYETHYLENE GLYCOL 3350 17 G PO PACK: 17.0000 g | PACK | Freq: Every day | ORAL | Status: DC | PRN

## 2021-07-06 MED ORDER — EPHEDRINE SULFATE (PRESSORS) 50 MG/ML IJ SOLN
INTRAMUSCULAR | Status: DC | PRN
  Administered 2021-07-06: 10 mg via INTRAVENOUS
  Administered 2021-07-06: 15 mg via INTRAVENOUS

## 2021-07-06 MED ORDER — CEFAZOLIN SODIUM-DEXTROSE 2-4 GM/100ML-% IV SOLN
INTRAVENOUS | Status: AC
  Administered 2021-07-06: 2 g via INTRAVENOUS
  Filled 2021-07-06: qty 100

## 2021-07-06 MED ORDER — CEFAZOLIN SODIUM-DEXTROSE 2-4 GM/100ML-% IV SOLN
INTRAVENOUS | Status: AC
Start: 2021-07-06 — End: 2021-07-06
  Filled 2021-07-06: qty 100

## 2021-07-06 MED ORDER — GLYCOPYRROLATE 0.2 MG/ML IJ SOLN
INTRAMUSCULAR | Status: DC | PRN
  Administered 2021-07-06: .2 mg via INTRAVENOUS

## 2021-07-06 MED ORDER — ACETAMINOPHEN 325 MG PO TABS: 325.0000 mg | ORAL_TABLET | Freq: Four times a day (QID) | ORAL | Status: DC | PRN

## 2021-07-06 MED ORDER — NEOMYCIN-POLYMYXIN B GU 40-200000 IR SOLN
Status: AC
  Filled 2021-07-06: qty 20

## 2021-07-06 MED ORDER — ONDANSETRON HCL 4 MG/2ML IJ SOLN
INTRAMUSCULAR | Status: DC | PRN
  Administered 2021-07-06: 4 mg via INTRAVENOUS

## 2021-07-06 MED ORDER — CEFAZOLIN SODIUM 1 G IJ SOLR
INTRAMUSCULAR | Status: AC
  Filled 2021-07-06: qty 10

## 2021-07-06 MED ORDER — PROPOFOL 500 MG/50ML IV EMUL
INTRAVENOUS | Status: DC | PRN
  Administered 2021-07-06: 50 ug/kg/min via INTRAVENOUS

## 2021-07-06 MED ORDER — PHENYLEPHRINE HCL (PRESSORS) 10 MG/ML IV SOLN
INTRAVENOUS | Status: AC
  Filled 2021-07-06: qty 1

## 2021-07-06 MED ORDER — BUPIVACAINE HCL (PF) 0.5 % IJ SOLN
INTRAMUSCULAR | Status: DC | PRN
  Administered 2021-07-06: 2.8 mL via INTRATHECAL

## 2021-07-06 MED ORDER — FLEET ENEMA 7-19 GM/118ML RE ENEM: 1.0000 | ENEMA | Freq: Once | RECTAL | Status: DC | PRN

## 2021-07-06 MED ORDER — LIDOCAINE HCL (PF) 2 % IJ SOLN
INTRAMUSCULAR | Status: AC
  Filled 2021-07-06: qty 5

## 2021-07-06 MED ORDER — PANTOPRAZOLE SODIUM 40 MG PO TBEC
DELAYED_RELEASE_TABLET | ORAL | Status: AC
  Administered 2021-07-06: 12:00:00 40 mg via ORAL
  Filled 2021-07-06: qty 1

## 2021-07-06 MED ORDER — BUPIVACAINE LIPOSOME 1.3 % IJ SUSP
INTRAMUSCULAR | Status: AC
  Filled 2021-07-06: qty 20

## 2021-07-06 MED ORDER — ORAL CARE MOUTH RINSE: 15.0000 mL | Freq: Once | OROMUCOSAL | Status: AC

## 2021-07-06 MED ORDER — MORPHINE SULFATE (PF) 4 MG/ML IV SOLN
INTRAVENOUS | Status: AC
  Filled 2021-07-06: qty 1

## 2021-07-06 MED ORDER — MORPHINE SULFATE (PF) 2 MG/ML IV SOLN
INTRAVENOUS | Status: AC
  Filled 2021-07-06: qty 1

## 2021-07-06 MED ORDER — PROPOFOL 1000 MG/100ML IV EMUL
INTRAVENOUS | Status: AC
  Filled 2021-07-06: qty 100

## 2021-07-06 MED ORDER — TRAMADOL HCL 50 MG PO TABS
ORAL_TABLET | ORAL | Status: AC
  Filled 2021-07-06: qty 1

## 2021-07-06 SURGICAL SUPPLY — 72 items
BLADE SAGITTAL 25.0X1.19X90 (BLADE) ×2 IMPLANT
BLADE SAW 90X13X1.19 OSCILLAT (BLADE) ×2 IMPLANT
BNDG ELASTIC 6X5.8 VLCR STR LF (GAUZE/BANDAGES/DRESSINGS) ×2 IMPLANT
BOWL CEMENT MIXING ADV NOZZLE (MISCELLANEOUS) ×1 IMPLANT
CANISTER WOUND CARE 500ML ATS (WOUND CARE) ×2 IMPLANT
CEMENT HV SMART SET (Cement) ×4 IMPLANT
CHLORAPREP W/TINT 26 (MISCELLANEOUS) ×4 IMPLANT
COOLER POLAR GLACIER W/PUMP (MISCELLANEOUS) ×2 IMPLANT
CUFF TOURN SGL QUICK 24 (TOURNIQUET CUFF)
CUFF TOURN SGL QUICK 34 (TOURNIQUET CUFF)
CUFF TRNQT CYL 24X4X16.5-23 (TOURNIQUET CUFF) IMPLANT
CUFF TRNQT CYL 34X4.125X (TOURNIQUET CUFF) IMPLANT
DRAPE 3/4 80X56 (DRAPES) ×4 IMPLANT
DRSG MEPILEX SACRM 8.7X9.8 (GAUZE/BANDAGES/DRESSINGS) ×2 IMPLANT
ELECT CAUTERY BLADE 6.4 (BLADE) ×2 IMPLANT
ELECT REM PT RETURN 9FT ADLT (ELECTROSURGICAL) ×2
ELECTRODE REM PT RTRN 9FT ADLT (ELECTROSURGICAL) ×1 IMPLANT
FEMORAL COMPONENT PS L S4N (Femur) ×1 IMPLANT
GAUZE SPONGE 4X4 12PLY STRL (GAUZE/BANDAGES/DRESSINGS) ×1 IMPLANT
GAUZE XEROFORM 1X8 LF (GAUZE/BANDAGES/DRESSINGS) ×1 IMPLANT
GLOVE SURG ORTHO LTX SZ8 (GLOVE) ×2 IMPLANT
GLOVE SURG SYN 9.0  PF PI (GLOVE) ×2
GLOVE SURG SYN 9.0 PF PI (GLOVE) ×1 IMPLANT
GLOVE SURG UNDER LTX SZ8 (GLOVE) ×2 IMPLANT
GLOVE SURG UNDER POLY LF SZ9 (GLOVE) ×2 IMPLANT
GOWN SRG 2XL LVL 4 RGLN SLV (GOWNS) ×1 IMPLANT
GOWN STRL NON-REIN 2XL LVL4 (GOWNS) ×2
GOWN STRL REUS W/ TWL LRG LVL3 (GOWN DISPOSABLE) ×1 IMPLANT
GOWN STRL REUS W/ TWL XL LVL3 (GOWN DISPOSABLE) ×1 IMPLANT
GOWN STRL REUS W/TWL LRG LVL3 (GOWN DISPOSABLE) ×2
GOWN STRL REUS W/TWL XL LVL3 (GOWN DISPOSABLE) ×2
HOLDER FOLEY CATH W/STRAP (MISCELLANEOUS) ×2 IMPLANT
IV NS IRRIG 3000ML ARTHROMATIC (IV SOLUTION) ×2 IMPLANT
KIT PREVENA INCISION MGT20CM45 (CANNISTER) ×2 IMPLANT
KIT TURNOVER KIT A (KITS) ×2 IMPLANT
KNEE TIBIAL INSERT FXD 10MM S4 (Insert) ×1 IMPLANT
MANIFOLD NEPTUNE II (INSTRUMENTS) ×3 IMPLANT
NDL SAFETY ECLIPSE 18X1.5 (NEEDLE) ×1 IMPLANT
NDL SPNL 18GX3.5 QUINCKE PK (NEEDLE) ×1 IMPLANT
NDL SPNL 20GX3.5 QUINCKE YW (NEEDLE) ×1 IMPLANT
NEEDLE HYPO 18GX1.5 SHARP (NEEDLE) ×2
NEEDLE SPNL 18GX3.5 QUINCKE PK (NEEDLE) IMPLANT
NEEDLE SPNL 20GX3.5 QUINCKE YW (NEEDLE) ×2 IMPLANT
NS IRRIG 1000ML POUR BTL (IV SOLUTION) ×2 IMPLANT
PACK TOTAL KNEE (MISCELLANEOUS) ×2 IMPLANT
PAD WRAPON POLAR KNEE (MISCELLANEOUS) ×1 IMPLANT
PAD WRAPON POLOR MULTI XL (MISCELLANEOUS) IMPLANT
PATELLA RESURFACING MEDACTA 02 (Bone Implant) ×1 IMPLANT
PENCIL SMOKE EVACUATOR COATED (MISCELLANEOUS) ×2 IMPLANT
PULSAVAC PLUS IRRIG FAN TIP (DISPOSABLE) ×2
SCALPEL PROTECTED #10 DISP (BLADE) ×4 IMPLANT
SOLUTION IRRIG SURGIPHOR (IV SOLUTION) ×1 IMPLANT
SOLUTION PRONTOSAN WOUND 350ML (IRRIGATION / IRRIGATOR) ×1 IMPLANT
STAPLER SKIN PROX 35W (STAPLE) ×2 IMPLANT
STEM EXTENSION 11MMX30MM (Stem) ×1 IMPLANT
SUCTION FRAZIER HANDLE 10FR (MISCELLANEOUS) ×2
SUCTION TUBE FRAZIER 10FR DISP (MISCELLANEOUS) ×1 IMPLANT
SUT DVC 2 QUILL PDO  T11 36X36 (SUTURE) ×2
SUT DVC 2 QUILL PDO T11 36X36 (SUTURE) ×1 IMPLANT
SUT ETHIBOND 2 V 37 (SUTURE) IMPLANT
SUT V-LOC 90 ABS DVC 3-0 CL (SUTURE) ×2 IMPLANT
SYR 20ML LL LF (SYRINGE) ×2 IMPLANT
SYR 50ML LL SCALE MARK (SYRINGE) ×4 IMPLANT
TIBIAL TRAY FIXED MEDACTA 0207 (Bone Implant) ×1 IMPLANT
TIP FAN IRRIG PULSAVAC PLUS (DISPOSABLE) ×1 IMPLANT
TOWEL OR 17X26 4PK STRL BLUE (TOWEL DISPOSABLE) ×2 IMPLANT
TOWER CARTRIDGE SMART MIX (DISPOSABLE) ×1 IMPLANT
TRAY FOLEY MTR SLVR 16FR STAT (SET/KITS/TRAYS/PACK) ×2 IMPLANT
WATER STERILE IRR 1000ML POUR (IV SOLUTION) ×3 IMPLANT
WRAP-ON POLOR PAD MULTI XL (MISCELLANEOUS) ×1
WRAPON POLAR PAD KNEE (MISCELLANEOUS)
WRAPON POLOR PAD MULTI XL (MISCELLANEOUS) ×2

## 2021-07-06 NOTE — TOC Progression Note (Signed)
Transition of Care (TOC) - Progression Note  ? ? ?Patient Details  ?Name: Sarah Marquez ?MRN: [id number] ?Date of Birth: August 22, 1955 ? ?Transition of Care (TOC) CM/SW Contact  ?Conception Oms, RN ?Phone Number: ?07/06/2021, 11:15 AM ? ?Clinical Narrative:   [facility] is set up Pre surgery By the Surgeons office for [facility] PT, PT to eval, TOC to follow and assist with DC planning ? ? ? ?  ?  ? ?Expected Discharge Plan and Services ?  ?  ?  ?  ?  ?                ?  ?  ?  ?  ?  ?  ?  ?  ?  ?  ? ? ?Social Determinants of Health (SDOH) Interventions ?  ? ?Readmission Risk Interventions ?No flowsheet data found. ? ?

## 2021-07-06 NOTE — H&P (Signed)
Chief Complaint  ?Patient presents with  ? Right Knee - Post Operative Visit  ? ? ?History of the Present Illness: ?Sarah Marquez is a 66 y.o. female here today. The patient presents for follow-up evaluation status post right total knee arthroplasty performed on 10/25/2020. She is coming back today for evaluation of her left knee. The last dedicated left knee x-rays were in 2019. She had standing x-rays when we were evaluating the right knee. She had very large osteophytes posteriorly, extensive patellofemoral osteophytes. Sunrise view showed severe patellofemoral arthritis. Her most recent standing x-ray of both knees from 12/17/2020 shows complete loss of lateral joint, and significant erosion of the tibia with valgus deformity. ? ?The patient states the other day when she got out of the bed, it felt like she was moving backwards as she started around the bed, and she allegedly felt like falling to the floor. She grabbed the bed, and then fell down. She mentions her left knee is not swollen today, but it is still swollen. ? ?The patient mentions she had 2 ruptured discs, first at age 12 and second at age 43. She knows when she overexerts herself, but other than that, she is leery of getting a spinal tap. ? ?The patient lives in [place], [place]. ? ?I have reviewed past medical, surgical, social and family history, and allergies as documented in the EMR. ? ?Past Medical History: ?Past Medical History:  ?Diagnosis Date  ? Anemia  ?IDA  ? Bilateral carpal tunnel syndrome  ?Chronic.  ? Blood in stool  ? Colon polyp 2014  ?adenomatous  ? GERD (gastroesophageal reflux disease)  ? Hyperlipidemia  ? Hypertension  ? IDA (iron deficiency anemia)  ? Menopause  ? Metabolic syndrome  ? Osteoarthritis  ? Recurrent sinusitis  ? Stomach ulcer 2014  ? ?Past Surgical History: ?Past Surgical History:  ?Procedure Laterality Date  ? KNEE ARTHROSCOPY Right 11/30/2002  ?Right knee arthroscopy with partial medial and lateral meniscectomies.  ?  COLONOSCOPY 08/16/2004  ?Adenomatous Polyp  ? EGD 08/16/2004  ? COLONOSCOPY 10/17/2007  ?PH Adenomatous Polyp  ? COLONOSCOPY 06/20/2012  ?Adenomatous Polyp: CBF 05/2017  ? EGD 06/20/2012  ?No repeat per RTE  ? EGD 10/23/2013  ?No repeat per RTE  ? COLONOSCOPY 10/16/2017  ?PH Adenomatous Polyp  ? EGD 10/16/2017  ?Gastritis: No repeat per RTE  ? ARTHROPLASTY TOTAL KNEE Right 10/25/2020  ?Terius Jacuinde  ? REPLACEMENT TOTAL KNEE Right 10/25/2020  ?Daily Doe  ? ABDOMINAL HYSTERECTOMY  ? APPENDECTOMY  ? back surgery  ? CHOLECYSTECTOMY  ? FUNCTIONAL ENDOSCOPIC SINUS SURGERY  ? ruptured disk surgery  ? ?Past Family History: ?Family History  ?Problem Relation Age of Onset  ? Cancer Mother  ? Myocardial Infarction (Heart attack) Father  ? Myocardial Infarction (Heart attack) Brother  ? Hepatitis Brother  ? Liver disease Brother  ? ?Medications: ?Current Outpatient Medications Ordered in Epic  ?Medication Sig Dispense Refill  ? beclomethasone dipropionate (QVAR REDIHALER HFA) 80 mcg/actuation inhaler Inhale into the lungs  ? cholecalciferol (VITAMIN D3) 1000 unit tablet Take by mouth once daily.  ? fluticasone (FLONASE) 50 mcg/actuation nasal spray as needed.  ? losartan (COZAAR) 50 MG tablet Take 50 mg by mouth once daily. 12  ? magnesium 200 mg Take 1 tablet by mouth 2 (two) times daily.  ? mometasone (ELOCON) 0.1 % ointment APPLY THIN FILM TO AFFECTED AREAS TWICE DAILY UNTIL CLEAR, THEN AS NEEDED  ? montelukast (SINGULAIR) 10 mg tablet Take 1 tablet by mouth once daily 10  ?  naproxen sodium (ALEVE) 220 MG tablet Take by mouth  ? pantoprazole (PROTONIX) 40 MG DR tablet Take 1 tablet (40 mg total) by mouth 2 (two) times daily before meals 180 tablet 3  ? QVAR REDIHALER 80 mcg/actuation inhaler TAKE TWO PUFFS TWICE DAILY  ? rosuvastatin (CRESTOR) 10 MG tablet Take 10 mg by mouth once daily  ? triamcinolone 0.1 % ointment APPLY TO AFFECTED AREA TWICE A DAY AS NEEDED  ? verapamil (CALAN-SR) 240 MG SR tablet Take 1 tablet by mouth once daily.   ? ?No current Epic-ordered facility-administered medications on file.  ? ?Allergies: ?Allergies  ?Allergen Reactions  ? Albuterol Other (See Comments)  ?Nebulizer form, causes wheezing  ? Beconase Aq [Beclomethasone Diprop (Aq)] Swelling  ?Facial  ? Latex Rash  ? Meloxicam Palpitations  ? Sulfa (Sulfonamide Antibiotics) Rash  ? ? ?Body mass index is 43.33 kg/m?. ? ?Review of Systems: ?A comprehensive 14 point ROS was performed, reviewed, and the pertinent orthopaedic findings are documented in the HPI. ? ?Vitals:  ?06/12/21 1319  ?BP: 118/72  ? ? ?General Physical Examination:  ? ?General/Constitutional: No apparent distress: well-nourished and well developed. ?Eyes: Pupils equal, round with synchronous movement. ?Lungs: Clear to auscultation ?HEENT: Normal ?Vascular: No edema, swelling or tenderness, except as noted in detailed exam. ?Cardiac: Heart rate and rhythm is regular. ?Integumentary: No impressive skin lesions present, except as noted in detailed exam. ?Neuro/Psych: Normal mood and affect, oriented to person, place and time. ? ?Musculoskeletal Examination: ?On exam, left knee range of motion is 0 to 110 degrees. Right knee valgus deformity. Bilateral knee edema, left more than right. ? ?Radiographs: ? ?No new imaging studies were obtained today. ? ?Assessment: ?ICD-10-CM  ?1. Status post total right knee replacement Z96.651  ?2. Primary osteoarthritis of right knee M17.11  ?3. Primary localized osteoarthritis of left knee M17.12  ?4. Morbid obesity (CMS-HCC) E66.01  ? ?Plan: ? ?The patient has clinical findings of severe left patellofemoral arthritis. ? ?We discussed the patient's prior x-ray findings. I explained she has severe left patellofemoral arthritis. I recommended left total knee arthroplasty. I explained the surgery and postoperative course in detail. ? ?We will schedule the patient for left total knee arthroplasty in the near future. ? ?Surgical Risks: ? ?The nature of the condition and the  proposed procedure has been reviewed in detail with the patient. Surgical versus non-surgical options and prognosis for recovery have been reviewed and the inherent risks and benefits of each have been discussed including the risks of infection, bleeding, injury to nerves/blood vessels/tendons, incomplete relief of symptoms, persisting pain and/or stiffness, loss of function, complex regional pain syndrome, failure of the procedure, as appropriate. ? ?Document Attestation: ?I, Joesphine Bare, am documenting for [facility], MD, utilizing N[employer] DAX.  ? ?Electronically signed by Lauris Poag, MD at 06/13/2021 7:08 AM EST ? ? ?Reviewed  H+P. ?No changes noted. ? ? ?

## 2021-07-06 NOTE — Transfer of Care (Signed)
Immediate Anesthesia Transfer of Care Note ? ?Patient: Sarah Marquez ? ?Procedure(s) Performed: TOTAL KNEE ARTHROPLASTY (Left: Knee) ? ?Patient Location: PACU ? ?Anesthesia Type:Spinal ? ?Level of Consciousness: awake, alert  and oriented ? ?Airway & Oxygen Therapy: Patient Spontanous Breathing ? ?Post-op Assessment: Report given to RN and Post -op Vital signs reviewed and stable ? ?Post vital signs: Reviewed ? ?Last Vitals:  ?Vitals Value Taken Time  ?BP    ?Temp    ?Pulse    ?Resp    ?SpO2    ? ? ?Last Pain:  ?Vitals:  ? 07/06/21 0622  ?TempSrc: Oral  ?PainSc: 5   ?   ? ?  ? ?Complications: No notable events documented. ?

## 2021-07-06 NOTE — Anesthesia Procedure Notes (Signed)
Spinal ? ?Patient location during procedure: OR ?Reason for block: surgical anesthesia ?Staffing ?Performed: resident/CRNA  ?Anesthesiologist: Piscitello, Precious Haws, MD ?Resident/CRNA: Rolla Plate, CRNA ?Preanesthetic Checklist ?Completed: patient identified, IV checked, site marked, risks and benefits discussed, surgical consent, monitors and equipment checked, pre-op evaluation and timeout performed ?Spinal Block ?Patient position: sitting ?Prep: ChloraPrep and site prepped and draped ?Patient monitoring: heart rate, continuous pulse ox, blood pressure and cardiac monitor ?Approach: midline ?Location: L3-4 ?Injection technique: single-shot ?Needle ?Needle type: Quincke  ?Needle gauge: 22 G ?Needle length: 12.7 cm ?Assessment ?Events: CSF return ?Additional Notes ?Negative paresthesia. Negative blood return. Positive free-flowing CSF. Expiration date of kit checked and confirmed. Patient tolerated procedure well, without complications. ? ? ? ? ? ?

## 2021-07-06 NOTE — Evaluation (Signed)
Physical Therapy Evaluation ?Patient Details ?Name: Sarah Marquez ?MRN: [id number] ?DOB: May 04, 1955 ?Today's Date: 07/06/2021 ? ?History of Present Illness ? Patient is a 66 year old female admitted for L TKA. PMH includes: anemia, GERD, HLD, HTN, s/p R TKA in June 22.  ?Clinical Impression ? Patient received in bed sleeping. She wakes to my voice and is agreeable to PT session. Patient is mod independent with supine to sit. Pain limited with sit to stand and ambulation. She requires min assist for transfers and ambulated 6 feet in room with RW and min guard. Patient reporting 10/10 pain.  She will continue to benefit from skilled PT while here to improve strength, mobility and independence to return home safely.      ?   ? ?Recommendations for follow up therapy are one component of a multi-disciplinary discharge planning process, led by the attending physician.  Recommendations may be updated based on patient status, additional functional criteria and insurance authorization. ? ?Follow Up Recommendations Home health PT ? ?  ?Assistance Recommended at Discharge Frequent or constant Supervision/Assistance  ?Patient can return home with the following ? Assistance with cooking/housework;Assist for transportation;Help with stairs or ramp for entrance;A lot of help with walking and/or transfers;A lot of help with bathing/dressing/bathroom ? ?  ?Equipment Recommendations None recommended by PT  ?Recommendations for Other Services ?    ?  ?Functional Status Assessment Patient has had a recent decline in their functional status and demonstrates the ability to make significant improvements in function in a reasonable and predictable amount of time.  ? ?  ?Precautions / Restrictions Precautions ?Precautions: Fall ?Restrictions ?Weight Bearing Restrictions: Yes ?LLE Weight Bearing: Weight bearing as tolerated  ? ?  ? ?Mobility ? Bed Mobility ?Overal bed mobility: Needs Assistance ?Bed Mobility: Supine to Sit, Sit to Supine ?  ?   ?Supine to sit: Modified independent (Device/Increase time), HOB elevated ?Sit to supine: Min assist ?  ?General bed mobility comments: min assist to bring L LE up onto bed ?  ? ?Transfers ?Overall transfer level: Needs assistance ?Equipment used: Rolling walker (2 wheels) ?Transfers: Sit to/from Stand ?Sit to Stand: From elevated surface, Min assist ?  ?  ?  ?  ?  ?  ?  ? ?Ambulation/Gait ?Ambulation/Gait assistance: Min guard ?Gait Distance (Feet): 6 Feet ?Assistive device: Rolling walker (2 wheels) ?Gait Pattern/deviations: Step-to pattern, Decreased step length - right, Decreased step length - left, Decreased weight shift to left ?Gait velocity: decr ?  ?  ?General Gait Details: patient able to take a few steps in room, reporting 10/10 pain. ? ?Stairs ?  ?  ?  ?  ?  ? ?Wheelchair Mobility ?  ? ?Modified Rankin (Stroke Patients Only) ?  ? ?  ? ?Balance Overall balance assessment: Needs assistance ?Sitting-balance support: Feet supported ?Sitting balance-Leahy Scale: Good ?  ?  ?Standing balance support: Bilateral upper extremity supported, During functional activity, Reliant on assistive device for balance ?Standing balance-Leahy Scale: Fair ?  ?  ?  ?  ?  ?  ?  ?  ?  ?  ?  ?  ?   ? ? ? ?Pertinent Vitals/Pain Pain Assessment ?Pain Assessment: 0-10 ?Pain Score: 10-Worst pain ever ?Pain Location: L knee with mobility ?Pain Descriptors / Indicators: Discomfort, Sore ?Pain Intervention(s): Monitored during session, Limited activity within patient's tolerance, Premedicated before session, Repositioned  ? ? ?Home Living Family/patient expects to be discharged to:: Private residence ?Living Arrangements: Spouse/significant other ?Available Help at Discharge:  Family;Available 24 hours/day ?Type of Home: House ?Home Access: Ramped entrance ?  ?  ?  ?Home Layout: Laundry or work area in basement;Able to live on main level with bedroom/bathroom ?Home Equipment: Shower seat;Hand held shower head;Grab bars -  tub/shower;Adaptive equipment;Cane - single point;Crutches ?   ?  ?Prior Function Prior Level of Function : Independent/Modified Independent ?  ?  ?  ?  ?  ?  ?  ?  ?  ? ? ?Hand Dominance  ?   ? ?  ?Extremity/Trunk Assessment  ? Upper Extremity Assessment ?Upper Extremity Assessment: Overall WFL for tasks assessed ?  ? ?Lower Extremity Assessment ?Lower Extremity Assessment: LLE deficits/detail ?LLE Coordination: decreased gross motor ?  ? ?Cervical / Trunk Assessment ?Cervical / Trunk Assessment: Normal  ?Communication  ? Communication: No difficulties  ?Cognition Arousal/Alertness: Awake/alert, Lethargic ?Behavior During Therapy: [facility] for tasks assessed/performed ?Overall Cognitive Status: Within Functional Limits for tasks assessed ?  ?  ?  ?  ?  ?  ?  ?  ?  ?  ?  ?  ?  ?  ?  ?  ?  ?  ?  ? ?  ?General Comments   ? ?  ?Exercises Total Joint Exercises ?Ankle Circles/Pumps: AROM, Both, 10 reps ?Goniometric ROM: ~0-80  ? ?Assessment/Plan  ?  ?PT Assessment Patient needs continued PT services  ?PT Problem List Decreased strength;Decreased range of motion;Decreased activity tolerance;Decreased balance;Pain;Decreased mobility ? ?   ?  ?PT Treatment Interventions DME instruction;Therapeutic exercise;Gait training;Balance training;Functional mobility training;Therapeutic activities;Patient/family education   ? ?PT Goals (Current goals can be found in the Care Plan section)  ?Acute Rehab PT Goals ?Patient Stated Goal: to return home ?PT Goal Formulation: With patient/family ?Time For Goal Achievement: 07/13/21 ?Potential to Achieve Goals: Good ? ?  ?Frequency BID ?  ? ? ?Co-evaluation   ?  ?  ?  ?  ? ? ?  ?AM-PAC PT "6 Clicks" Mobility  ?Outcome Measure Help needed turning from your back to your side while in a flat bed without using bedrails?: A Little ?Help needed moving from lying on your back to sitting on the side of a flat bed without using bedrails?: A Little ?Help needed moving to and from a bed to a chair  (including a wheelchair)?: A Little ?Help needed standing up from a chair using your arms (e.g., wheelchair or bedside chair)?: A Little ?Help needed to walk in hospital room?: A Little ?Help needed climbing 3-5 steps with a railing? : A Lot ?6 Click Score: 17 ? ?  ?End of Session   ?Activity Tolerance: Patient limited by pain ?Patient left: in bed;with call bell/phone within reach;with bed alarm set;with SCD's reapplied ?Nurse Communication: Mobility status ?PT Visit Diagnosis: Other abnormalities of gait and mobility (R26.89);Difficulty in walking, not elsewhere classified (R26.2);Pain ?Pain - Right/Left: Left ?Pain - part of body: Knee ?  ? ?Time: 0076-2263 ?PT Time Calculation (min) (ACUTE ONLY): 23 min ? ? ?Charges:   PT Evaluation ?$PT Eval Moderate Complexity: 1 Mod ?PT Treatments ?$Gait Training: 8-22 mins ?  ?   ? ? ?Amanda Cockayne, PT, GCS ?07/06/21,2:52 PM ? ? ?

## 2021-07-06 NOTE — Anesthesia Preprocedure Evaluation (Signed)
Anesthesia Evaluation  ?Patient identified by MRN, date of birth, ID band ?Patient awake ? ? ? ?Reviewed: ?Allergy & Precautions, NPO status , Patient's Chart, lab work & pertinent test results ? ?History of Anesthesia Complications ?Negative for: history of anesthetic complications ? ?Airway ?Mallampati: III ? ?TM Distance: <3 FB ?Neck ROM: full ? ? ? Dental ? ?(+) Missing ?  ?Pulmonary ?shortness of breath and with exertion, asthma , sleep apnea ,  ?  ?Pulmonary exam normal ? ? ? ? ? ? ? Cardiovascular ?hypertension, (-) anginaNormal cardiovascular exam ? ? ?  ?Neuro/Psych ? Neuromuscular disease negative psych ROS  ? GI/Hepatic ?Neg liver ROS, PUD, GERD  Controlled,  ?Endo/Other  ?negative endocrine ROS ? Renal/GU ?  ? ?  ?Musculoskeletal ? ? Abdominal ?  ?Peds ? Hematology ?negative hematology ROS ?(+)   ?Anesthesia Other Findings ?Past Medical History: ?No date: Arthritis ?    Comment:  neck, knees ?No date: Asthma ?No date: Cancer of the skin, basal cell ?    Comment:  a.) s/p excision from neck, back, and foot ?No date: Carpal tunnel syndrome ?No date: Colon polyps ?No date: Difficult intubation ?No date: Gastric ulcer ?No date: GERD (gastroesophageal reflux disease) ?No date: Gout ?No date: Hyperlipidemia ?No date: Hypertension ?No date: Iron deficiency anemia ?No date: OSA (obstructive sleep apnea) ?    Comment:  a.) does not require nocturnal PAP therapy ?No date: Vertigo ?    Comment:  last episode 12/2018 ?No date: Wears dentures ?    Comment:  partial upper ? ?Past Surgical History: ?No date: ABDOMINAL HYSTERECTOMY ?No date: APPENDECTOMY ?No date: BACK SURGERY ?    Comment:  x2 FOR RUPTURED DISC ?1990's: CARDIAC CATHETERIZATION ?04/07/2019: CATARACT EXTRACTION W/PHACO; Left ?    Comment:  Procedure: CATARACT EXTRACTION PHACO AND INTRAOCULAR  ?             LENS PLACEMENT (IOC) LEFT 4.43, 00:34.1;  Surgeon:  ?             Birder Robson, MD;  Location: [place];   ?             Service: Ophthalmology;  Laterality: Left;  Sleep  ?             Apnea-does not wear CPAP ?Latex-adhesives ?05/05/2019: CATARACT EXTRACTION W/PHACO; Right ?    Comment:  Procedure: CATARACT EXTRACTION PHACO AND INTRAOCULAR  ?             LENS PLACEMENT (IOC) RIGHT;  4.83, 00:33.4;  Surgeon:  ?             Birder Robson, MD;  Location: [place];   ?             Service: Ophthalmology;  Laterality: Right;  SLEP  ?             APNEA-CPAP ?No date: CHOLECYSTECTOMY ?No date: COLONOSCOPY ?10/16/2017: COLONOSCOPY WITH PROPOFOL; N/A ?    Comment:  Procedure: COLONOSCOPY WITH PROPOFOL;  Surgeon: Vira Agar, ?             Gavin Pound, MD;  Location: [facility];  Service:  ?             Endoscopy;  Laterality: N/A; ?No date: ESOPHAGOGASTRODUODENOSCOPY ?10/16/2017: ESOPHAGOGASTRODUODENOSCOPY (EGD) WITH PROPOFOL; N/A ?    Comment:  Procedure: ESOPHAGOGASTRODUODENOSCOPY (EGD) WITH  ?             PROPOFOL;  Surgeon: Manya Silvas, MD;  Location:  ?             [facility];  Service: Endoscopy;  Laterality: N/A; ?No date: FUNCTIONAL ENDOSCOPIC SINUS SURGERY ?No date: knee arthoscopy ?No date: LUMBAR DISC SURGERY ?    Comment:  ruptured disc x 2 ?No date: TONSILLECTOMY AND ADENOIDECTOMY ?10/25/2020: TOTAL KNEE ARTHROPLASTY; Right ?    Comment:  Procedure: TOTAL KNEE ARTHROPLASTY - Rachelle Hora to  ?             assist;  Surgeon: Hessie Knows, MD;  Location: [facility]; ?             Service: Orthopedics;  Laterality: Right; ? ?BMI   ? Body Mass Index: 43.22 kg/m?  ?  ? ? Reproductive/Obstetrics ?negative OB ROS ? ?  ? ? ? ? ? ? ? ? ? ? ? ? ? ?  ?  ? ? ? ? ? ? ? ? ?Anesthesia Physical ?Anesthesia Plan ? ?ASA: 3 ? ?Anesthesia Plan: Spinal  ? ?Post-op Pain Management:   ? ?Induction:  ? ?PONV Risk Score and Plan:  ? ?Airway Management Planned: Natural Airway and Nasal Cannula ? ?Additional Equipment:  ? ?Intra-op Plan:  ? ?Post-operative Plan:  ? ?Informed Consent: I have reviewed the patients  History and Physical, chart, labs and discussed the procedure including the risks, benefits and alternatives for the proposed anesthesia with the patient or authorized representative who has indicated his/her understanding and acceptance.  ? ? ? ?Dental Advisory Given ? ?Plan Discussed with: Anesthesiologist, CRNA and Surgeon ? ?Anesthesia Plan Comments: (Patient reports no bleeding problems and no anticoagulant use. ? ?Plan for spinal with backup GA ? ?Patient consented for risks of anesthesia including but not limited to:  ?- adverse reactions to medications ?- damage to eyes, teeth, lips or other oral mucosa ?- nerve damage due to positioning  ?- risk of bleeding, infection and or nerve damage from spinal that could lead to paralysis ?- risk of headache or failed spinal ?- damage to teeth, lips or other oral mucosa ?- sore throat or hoarseness ?- damage to heart, brain, nerves, lungs, other parts of body or loss of life ? ?Patient voiced understanding.)  ? ? ? ? ? ? ?Anesthesia Quick Evaluation ? ?

## 2021-07-06 NOTE — Op Note (Signed)
07/06/2021 ? ?9:34 AM ? ?PATIENT:  Sarah Marquez  ? ?MRN: [id number] ? ?PRE-OPERATIVE DIAGNOSIS:  Primary localized osteoarthritis of left knee ?  ?POST-OPERATIVE DIAGNOSIS:  Same ?  ?PROCEDURE:  Procedure(s): ?Left TOTAL KNEE ARTHROPLASTY ?  ?SURGEON: Laurene Footman, MD ?  ?ASSISTANTS: Rachelle Hora, PA-C ?  ?ANESTHESIA:   spinal ?  ?EBL: 100 ?  ?BLOOD ADMINISTERED:none ?  ?DRAINS:  Incisional wound VAC   ?  ?LOCAL MEDICATIONS USED:  MARCAINE    and OTHER Exparel and morphine ?  ?SPECIMEN:  No Specimen ?  ?DISPOSITION OF SPECIMEN:  N/A ?  ?COUNTS:  YES ?  ?TOURNIQUET: 69 minutes at 300 mm Hg ?  ?IMPLANTS: Medacta  GMK system with 4 and left femur, 4 tibia with short stem and 10 mm insert.  Size 2 patella, all components cemented. ?  ?DICTATION: .Dragon Dictation   patient was brought to the operating room and spinal anesthesia was obtained.  After prepping and draping the left leg in sterile fashion, and after patient identification and timeout procedures were completed, tourniquet was raised  and midline skin incision was made followed by medial parapatellar arthrotomy with advanced medial compartment osteoarthritis, severe patellofemoral arthritis and severe lateral compartment arthritis, partial synovectomy was also carried out.   The ACL was deficient, the PCL and fat pad were excised along with anterior horns of the meniscus. The proximal tibia cutting guide from  the extra medullary system was applied and the proximal tibia cut carried out.  The distal femoral cut was carried out in a similar fashion using the intramedullary guide followed by sizing guide for size 4.     The 4 femoral cutting guide applied with anterior posterior and chamfer cuts made.  The posterior horns of the menisci were removed at this point.   Injection of the above medication was carried out after the femoral and tibial cuts were carried out.  The 4 baseplate trial was placed pinned into position and proximal tibial preparation carried  out with drilling hand reaming and the keel punch followed by placement of the 4 and femur and sizing the tibial insert size 10 millimeter gave the best fit with stability and full extension.  The distal femoral drill holes were made in the notch cut for the trochlear groove was then carried out with trials were then removed the patella was cut using the patellar cutting guide and it sized to a size 2 after drill holes have been made  The knee was irrigated with pulsatile lavage and the bony surfaces dried the tibial component was cemented into place first.  Excess cement was removed and the polyethylene insert placed with a torque screw placed with a torque screwdriver tightened.  The distal femoral component was placed and the knee was held in extension as the patellar button was clamped into place.  After the cement was set, excess cement was removed and the knee was again irrigated thoroughly thoroughly irrigated.  The tourniquet was let down and hemostasis checked with electrocautery. The arthrotomy was repaired with a heavy Quill suture,  followed by 3-0 V lock subcuticular closure, skin staples followed by incisional wound VAC and Polar Care.. ?  ?PLAN OF CARE: Admit for overnight observation ?  ?PATIENT DISPOSITION:  PACU - hemodynamically stable. ?  ?  ?   ?  ?  ?  ?  ?

## 2021-07-07 DIAGNOSIS — M1712 Unilateral primary osteoarthritis, left knee: Secondary | ICD-10-CM | POA: Diagnosis not present

## 2021-07-07 LAB — BASIC METABOLIC PANEL
Anion gap: 4 — ABNORMAL LOW (ref 5–15)
BUN: 15 mg/dL (ref 8–23)
CO2: 26 mmol/L (ref 22–32)
Calcium: 8.3 mg/dL — ABNORMAL LOW (ref 8.9–10.3)
Chloride: 105 mmol/L (ref 98–111)
Creatinine, Ser: 0.76 mg/dL (ref 0.44–1.00)
GFR, Estimated: >60 mL/min (ref >60)
Glucose, Bld: 124 mg/dL — ABNORMAL HIGH (ref 70–99)
Potassium: 3.9 mmol/L (ref 3.5–5.1)
Sodium: 135 mmol/L (ref 135–145)

## 2021-07-07 LAB — CBC
HCT: 32.2 % — ABNORMAL LOW (ref 36.0–46.0)
Hemoglobin: 10.5 g/dL — ABNORMAL LOW (ref 12.0–15.0)
MCH: 26.3 pg (ref 26.0–34.0)
MCHC: 32.6 g/dL (ref 30.0–36.0)
MCV: 80.7 fL (ref 80.0–100.0)
Platelets: 141 10*3/uL — ABNORMAL LOW (ref 150–400)
RBC: 3.99 MIL/uL (ref 3.87–5.11)
RDW: 15.7 % — ABNORMAL HIGH (ref 11.5–15.5)
WBC: 7.5 10*3/uL (ref 4.0–10.5)
nRBC: 0 % (ref 0.0–0.2)

## 2021-07-07 MED ORDER — HYDROCODONE-ACETAMINOPHEN 5-325 MG PO TABS
ORAL_TABLET | ORAL | Status: AC
  Administered 2021-07-07: 1 via ORAL
  Filled 2021-07-07: qty 1

## 2021-07-07 MED ORDER — TRAMADOL HCL 50 MG PO TABS: 50.0000 mg | ORAL_TABLET | Freq: Four times a day (QID) | ORAL | 0 refills | Status: DC | PRN

## 2021-07-07 MED ORDER — TRAMADOL HCL 50 MG PO TABS
ORAL_TABLET | ORAL | Status: AC
  Administered 2021-07-07: 50 mg via ORAL
  Filled 2021-07-07: qty 1

## 2021-07-07 MED ORDER — DOCUSATE SODIUM 100 MG PO CAPS
ORAL_CAPSULE | ORAL | Status: AC
  Administered 2021-07-07: 100 mg via ORAL
  Filled 2021-07-07: qty 1

## 2021-07-07 MED ORDER — DOCUSATE SODIUM 100 MG PO CAPS: 100.0000 mg | ORAL_CAPSULE | Freq: Two times a day (BID) | ORAL | 0 refills | Status: DC

## 2021-07-07 MED ORDER — METHOCARBAMOL 500 MG PO TABS: 500.0000 mg | ORAL_TABLET | Freq: Four times a day (QID) | ORAL | 0 refills | Status: DC | PRN

## 2021-07-07 MED ORDER — HYDROCODONE-ACETAMINOPHEN 7.5-325 MG PO TABS: 1.0000 | ORAL_TABLET | ORAL | 0 refills | Status: DC | PRN

## 2021-07-07 MED ORDER — ENOXAPARIN SODIUM 40 MG/0.4ML IJ SOSY: 40.0000 mg | PREFILLED_SYRINGE | INTRAMUSCULAR | 0 refills | Status: DC

## 2021-07-07 MED ORDER — POLYETHYLENE GLYCOL 3350 17 G PO PACK: 17.0000 g | PACK | Freq: Every day | ORAL | 0 refills | Status: DC | PRN

## 2021-07-07 MED ORDER — PANTOPRAZOLE SODIUM 40 MG PO TBEC
DELAYED_RELEASE_TABLET | ORAL | Status: AC
  Administered 2021-07-07: 40 mg via ORAL
  Filled 2021-07-07: qty 1

## 2021-07-07 MED ORDER — ENOXAPARIN SODIUM 30 MG/0.3ML IJ SOSY
PREFILLED_SYRINGE | INTRAMUSCULAR | Status: AC
  Administered 2021-07-07: 30 mg via SUBCUTANEOUS
  Filled 2021-07-07: qty 0.3

## 2021-07-07 MED ORDER — ONDANSETRON HCL 4 MG PO TABS: 4.0000 mg | ORAL_TABLET | Freq: Four times a day (QID) | ORAL | 0 refills | Status: DC | PRN

## 2021-07-07 NOTE — Progress Notes (Signed)
Physical Therapy Treatment ?Patient Details ?Name: Sarah Marquez ?MRN: [id number] ?DOB: [birth year] ?Today's Date: 07/07/2021 ? ? ?History of Present Illness Patient is a 66 year old female admitted for L TKA. PMH includes: anemia, GERD, HLD, HTN, s/p R TKA in June 22. ? ?  ?PT Comments  ? ? Pt was sitting EOB with RN at bedside. She is A and O x 4 and agreeable to session. Pt is endorsing severe pain but is willing to perform all desired task request. She was able to stand and ambulate with RW without safety concern. Ambulated to BR and successfully urinated prior to standing an ambulating 120 ft. No LOB or safety concern. Author issued HEP and demonstrated proper stair performance. Pt does not have stairs at home but states confidence in abilities. Overall pt is doing well. Pain limited more than strength limited. She demonstrated good AROM in knee and at conclusion of session was able to tolerate towel roll + polar care. Pt has had opposite recently replaced and feels safe to DC home with spouse later this date. She is cleared form an acute PT standpoint to DC home with HHPT to follow. She will continue to benefit form skilled PT to improve strength, ROM, and safety with all ADLs.   ?  ?Recommendations for follow up therapy are one component of a multi-disciplinary discharge planning process, led by the attending physician.  Recommendations may be updated based on patient status, additional functional criteria and insurance authorization. ? ?Follow Up Recommendations ? Home health PT ?  ?  ?Assistance Recommended at Discharge Frequent or constant Supervision/Assistance  ?Patient can return home with the following Assistance with cooking/housework;Assist for transportation;Help with stairs or ramp for entrance;A lot of help with walking and/or transfers;A lot of help with bathing/dressing/bathroom ?  ?Equipment Recommendations ? None recommended by PT  ?  ?   ?Precautions / Restrictions Precautions ?Precautions:  Fall ?Restrictions ?Weight Bearing Restrictions: Yes ?LLE Weight Bearing: Weight bearing as tolerated  ?  ? ?Mobility ? Bed Mobility ?   ?General bed mobility comments: pt was sitting EOB with RN in room upon arriving ?  ? ?Transfers ?Overall transfer level: Needs assistance ?Equipment used: Rolling walker (2 wheels) ?Transfers: Sit to/from Stand ?Sit to Stand: Supervision, From elevated surface ?  ?   ?General transfer comment: Pt was able to safely stand from lowest bed height. also stood from [facility] in BR during session. No physical assistance required to stand or sit. ?  ? ?Ambulation/Gait ?Ambulation/Gait assistance: Supervision ?Gait Distance (Feet): 120 Feet ?Assistive device: Rolling walker (2 wheels) ?Gait Pattern/deviations: Step-through pattern, Antalgic ?Gait velocity: decr ?  ?  ?General Gait Details: Pt was able to safely ambulate 120 ft with slow anatalgic gait pattern. recliner follow for additional safety. ? ? ?Stairs ?Stairs: Yes ?  ?  ?  ?General stair comments: Pt does not have stairs however author demonstarted and pt reports understanding of safe technique to perform. " i've been doing that way due to pain already. ? ?  ?Balance Overall balance assessment: Needs assistance ?Sitting-balance support: Feet supported ?Sitting balance-Leahy Scale: Good ?  ?  ?Standing balance support: Bilateral upper extremity supported, During functional activity, Reliant on assistive device for balance ?Standing balance-Leahy Scale: Fair ?  ?   ?Cognition Arousal/Alertness: Awake/alert ?Behavior During Therapy: [facility] for tasks assessed/performed ?Overall Cognitive Status: Within Functional Limits for tasks assessed ?  ?   ?General Comments: Pt is A and O x 4 ?  ?  ? ?  ?  Exercises Total Joint Exercises ?Goniometric ROM: ~ 86 degrees ? ?  ?General Comments General comments (skin integrity, edema, etc.): Author issue HEP handout and pt demonstrates correct performance. ?  ?  ? ?Pertinent Vitals/Pain Pain Assessment ?Pain  Assessment: 0-10 ?Pain Score: 9  ?Pain Location: L knee with mobility ?Pain Descriptors / Indicators: Discomfort, Sore ?Pain Intervention(s): Limited activity within patient's tolerance, Monitored during session, Premedicated before session, Repositioned, Ice applied  ? ? ? ?PT Goals (current goals can now be found in the care plan section) Acute Rehab PT Goals ?Patient Stated Goal: to return home ?Progress towards PT goals: Progressing toward goals ? ?  ?Frequency ? ? ? BID ? ? ? ?  ?PT Plan Current plan remains appropriate  ? ? ?   ?AM-PAC PT "6 Clicks" Mobility   ?Outcome Measure ? Help needed turning from your back to your side while in a flat bed without using bedrails?: A Little ?Help needed moving from lying on your back to sitting on the side of a flat bed without using bedrails?: A Little ?Help needed moving to and from a bed to a chair (including a wheelchair)?: A Little ?Help needed standing up from a chair using your arms (e.g., wheelchair or bedside chair)?: A Little ?Help needed to walk in hospital room?: A Little ?Help needed climbing 3-5 steps with a railing? : A Little ?6 Click Score: 18 ? ?  ?End of Session   ?Activity Tolerance: Patient limited by pain;Patient tolerated treatment well ?Patient left: in chair;with call bell/phone within reach;with chair alarm set ?Nurse Communication: Mobility status ?PT Visit Diagnosis: Other abnormalities of gait and mobility (R26.89);Difficulty in walking, not elsewhere classified (R26.2);Pain ?Pain - Right/Left: Left ?Pain - part of body: Knee ?  ? ? ?Time: 9470-9628 ?PT Time Calculation (min) (ACUTE ONLY): 26 min ? ?Charges:  $Gait Training: 8-22 mins ?$Therapeutic Exercise: 8-22 mins          ?          ? ?Julaine Fusi PTA ?07/07/21, 10:08 AM  ? ?

## 2021-07-07 NOTE — Progress Notes (Signed)
? ?  Subjective: ?1 Day Post-Op Procedure(s) (LRB): ?TOTAL KNEE ARTHROPLASTY (Left) ?Patient reports pain as mild.   ?Patient is well, and has had no acute complaints or problems ?Denies any CP, SOB, ABD pain. ?We will continue therapy today.  ?Plan is to go Home after hospital stay. ? ?Objective: ?Vital signs in last 24 hours: ?Temp:  [97.3 ?F (36.3 ?C)-99.7 ?F (37.6 ?C)] 99.7 ?F (37.6 ?C) (03/10 0600) ?Pulse Rate:  [70-93] 93 (03/10 0600) ?Resp:  [14-18] 17 (03/10 0600) ?BP: (94-131)/(49-70) 125/66 (03/10 0600) ?SpO2:  [93 %-97 %] 93 % (03/10 0600) ? ?Intake/Output from previous day: ?03/09 0701 - 03/10 0700 ?In: 3131.9 [P.O.:640; I.V.:2286.2; IV Piggyback:205.8] ?Out: 1175 [Urine:1125; Blood:50] ?Intake/Output this shift: ?No intake/output data recorded. ? ?Recent Labs  ?  07/06/21 ?1139 07/07/21 ?0613  ?HGB 10.7* 10.5*  ? ?Recent Labs  ?  07/06/21 ?1139 07/07/21 ?0613  ?WBC 10.8* 7.5  ?RBC 4.08 3.99  ?HCT 33.2* 32.2*  ?PLT 140* 141*  ? ?Recent Labs  ?  07/06/21 ?1139 07/07/21 ?0613  ?NA  --  135  ?K  --  3.9  ?CL  --  105  ?CO2  --  26  ?BUN  --  15  ?CREATININE 0.83 0.76  ?GLUCOSE  --  124*  ?CALCIUM  --  8.3*  ? ?No results for input(s): LABPT, INR in the last 72 hours. ? ?EXAM ?General - Patient is Alert, Appropriate, and Oriented ?Extremity - Neurovascular intact ?Sensation intact distally ?Intact pulses distally ?Dorsiflexion/Plantar flexion intact ?No cellulitis present ?Compartment soft ?Dressing - dressing C/D/I and no drainage, provena intact with out drainage ?Motor Function - intact, moving foot and toes well on exam.  ? ?Past Medical History:  ?Diagnosis Date  ? Arthritis   ? neck, knees  ? Asthma   ? Cancer of the skin, basal cell   ? a.) s/p excision from neck, back, and foot  ? Carpal tunnel syndrome   ? Colon polyps   ? Difficult intubation   ? Gastric ulcer   ? GERD (gastroesophageal reflux disease)   ? Gout   ? Hyperlipidemia   ? Hypertension   ? Iron deficiency anemia   ? OSA (obstructive sleep  apnea)   ? a.) does not require nocturnal PAP therapy  ? Vertigo   ? last episode 12/2018  ? Wears dentures   ? partial upper  ? ? ?Assessment/Plan:   ?1 Day Post-Op Procedure(s) (LRB): ?TOTAL KNEE ARTHROPLASTY (Left) ?Principal Problem: ?  S/P TKR (total knee replacement) using cement, left ? ?Estimated body mass index is 43.22 kg/m? as calculated from the following: ?  Height as of this encounter: '5\' 5"'$  (1.651 m). ?  Weight as of this encounter: 117.8 kg. ?Advance diet ?Up with therapy ?VSS ?Labs stable ?Pain well controlled ?CM to assist with discharge to home with HHPT today pending completion of PT goals ? ?DVT Prophylaxis - Lovenox, TED hose, and SCDs ?Weight-Bearing as tolerated to left leg ? ? ?T. Rachelle Hora, PA-C ?[place] ?07/07/2021, 7:42 AM ?  ?

## 2021-07-07 NOTE — Evaluation (Signed)
Occupational Therapy Evaluation ?Patient Details ?Name: Sarah Marquez ?MRN: [id number] ?DOB: 1956-01-18 ?Today's Date: 07/07/2021 ? ? ?History of Present Illness Patient is a 66 year old female admitted for L TKA. PMH includes: anemia, GERD, HLD, HTN, s/p R TKA in June 22.  ? ?Clinical Impression ?  ?Upon entering the room, pt seated in recliner chair with family present. Pt reports being independent at baseline. Per chart review, pt had R TKA ~ 9 months ago. OT provided paper handout and education regarding self care tasks and pt able to verbalize proper use of polar care from last hospital admission. Pt has all needed items at home to increase independence with self care and her husband will be assisting her if needed. They have no further concerns for safety at home. Pt is currently supervision for functional mobility tasks, S- min A for functional transfers with RW, set up A for UB self care, and anticipate min A for LB self care. OT to SIGN OFF and pt is agreeable.  ?   ? ?Recommendations for follow up therapy are one component of a multi-disciplinary discharge planning process, led by the attending physician.  Recommendations may be updated based on patient status, additional functional criteria and insurance authorization.  ? ?Follow Up Recommendations ? No OT follow up  ?  ?Assistance Recommended at Discharge Set up Supervision/Assistance  ?Patient can return home with the following A little help with walking and/or transfers;A little help with bathing/dressing/bathroom;Help with stairs or ramp for entrance;Assist for transportation ? ?  ?Functional Status Assessment ? Patient has had a recent decline in their functional status and demonstrates the ability to make significant improvements in function in a reasonable and predictable amount of time.  ?Equipment Recommendations ? None recommended by OT  ?  ?   ?Precautions / Restrictions Precautions ?Precautions: Fall ?Restrictions ?Weight Bearing Restrictions:  Yes ?LLE Weight Bearing: Weight bearing as tolerated  ? ?  ? ?Mobility Bed Mobility ?  ?  ?  ?  ?  ?  ?  ?General bed mobility comments: Pt seated in recliner chair ?  ? ?Transfers ?  ?  ?  ?  ?  ?  ?  ?  ?  ?  ?  ? ?  ?Balance Overall balance assessment: Needs assistance ?Sitting-balance support: Feet supported ?Sitting balance-Leahy Scale: Good ?  ?  ?  ?  ?  ?  ?  ?  ?  ?  ?  ?  ?  ?  ?  ?  ?   ? ?ADL either performed or assessed with clinical judgement  ? ?ADL   ?  ?  ?  ?  ?  ?  ?  ?  ?  ?  ?  ?  ?  ?  ?  ?  ?  ?  ?  ?General ADL Comments: set up A for UB self care and anticipate min A for LB self care to thread clothing onto L LE  ? ? ? ?Vision Patient Visual Report: No change from baseline ?   ?   ?   ?   ? ?Pertinent Vitals/Pain Pain Assessment ?Pain Assessment: 0-10 ?Pain Score: 8  ?Pain Location: L knee ?Pain Descriptors / Indicators: Discomfort, Sore ?Pain Intervention(s): Premedicated before session, Ice applied, Monitored during session  ? ? ? ?Hand Dominance Right ?  ?Extremity/Trunk Assessment Upper Extremity Assessment ?Upper Extremity Assessment: Overall WFL for tasks assessed ?  ?Lower Extremity Assessment ?Lower Extremity Assessment: Defer  to PT evaluation ?  ?  ?  ?Communication Communication ?Communication: No difficulties ?  ?Cognition Arousal/Alertness: Awake/alert ?Behavior During Therapy: [facility] for tasks assessed/performed ?Overall Cognitive Status: Within Functional Limits for tasks assessed ?  ?  ?  ?  ?  ?  ?  ?  ?  ?  ?  ?  ?  ?  ?  ?  ?General Comments: Pt is A and O x 4 ?  ?  ?General Comments  Author issue HEP handout and pt demonstrates correct performance. ? ?  ?   ?   ? ? ?Home Living Family/patient expects to be discharged to:: Private residence ?Living Arrangements: Spouse/significant other ?Available Help at Discharge: Family;Available 24 hours/day ?Type of Home: House ?Home Access: Ramped entrance ?  ?  ?Home Layout: Laundry or work area in basement;Able to live on main level  with bedroom/bathroom ?  ?  ?Bathroom Shower/Tub: Walk-in shower ?  ?  ?  ?  ?Home Equipment: Shower seat;Hand held shower head;Grab bars - tub/shower;Cane - single point;Crutches ?  ?  ?  ? ?  ?Prior Functioning/Environment Prior Level of Function : Independent/Modified Independent ?  ?  ?  ?  ?  ?  ?  ?  ?  ? ?  ?  ?   ?   ?   ?OT Goals(Current goals can be found in the care plan section) Acute Rehab OT Goals ?Patient Stated Goal: to go home ?OT Goal Formulation: With patient/family ?Time For Goal Achievement: 07/07/21 ?Potential to Achieve Goals: Good ?ADL Goals ?Pt Will Perform Grooming: with modified independence;standing ?Pt Will Perform Lower Body Dressing: with modified independence;sit to/from stand ?Pt Will Transfer to Toilet: with modified independence;ambulating ?Pt Will Perform Toileting - Clothing Manipulation and hygiene: with modified independence;sit to/from stand  ?OT Frequency:   ?  ? ?   ?AM-PAC OT "6 Clicks" Daily Activity     ?Outcome Measure Help from another person eating meals?: None ?Help from another person taking care of personal grooming?: None ?Help from another person toileting, which includes using toliet, bedpan, or urinal?: A Little ?Help from another person bathing (including washing, rinsing, drying)?: A Little ?Help from another person to put on and taking off regular upper body clothing?: None ?Help from another person to put on and taking off regular lower body clothing?: A Little ?6 Click Score: 21 ?  ?End of Session Nurse Communication: Mobility status ? ?Activity Tolerance: Patient limited by pain ?Patient left: in chair;with chair alarm set;with family/visitor present ? ?OT Visit Diagnosis: Unsteadiness on feet (R26.81);Muscle weakness (generalized) (M62.81);Pain ?Pain - Right/Left: Left ?Pain - part of body: Knee  ?              ?Time: 1010-1026 ?OT Time Calculation (min): 16 min ?Charges:  OT General Charges ?$OT Visit: 1 Visit ?OT Evaluation ?$OT Eval Low Complexity: 1  Low ?OT Treatments ?$Self Care/Home Management : 8-22 mins ? ?Darleen Crocker, MS, OTR/L , CBIS ?ascom [phone]  ?07/07/21, 10:40 AM  ?

## 2021-07-07 NOTE — Discharge Summary (Incomplete)
Physician Discharge Summary  Patient ID: Sarah Marquez MRN: [id number] DOB/AGE: [birth year] 68 y.o.  Admit date: 07/06/2021 Discharge date: 07/07/2021  Admission Diagnoses:  S/P TKR (total knee replacement) using cement, left [Z96.652]   Discharge Diagnoses: Patient Active Problem List   Diagnosis Date Noted   S/P TKR (total knee replacement) using cement, left 07/06/2021   S/P TKR (total knee replacement) using cement, right 10/25/2020   Right hip pain 02/26/2020   Calcification of right carotid artery- on panoramic x ray 09/2019 02/26/2020   Urinary tract infection without hematuria 02/18/2020   Cough variant asthma 12/27/2015   Dyspnea 11/14/2015   Recurrent URI (upper respiratory infection) 11/10/2015   Cough 11/10/2015   Benign neoplasm of colon 01/19/2015   Allergic rhinitis 01/19/2015   Absolute anemia 01/19/2015   Colon polyp 01/19/2015   Ankle bruise 60/01/9322   Dysmetabolic syndrome X 55/73/2202   Disorder of esophagus 01/19/2015   Esophagitis, reflux 01/19/2015   Frontal sinusitis 01/19/2015   BP (high blood pressure) 01/19/2015   Anemia, iron deficiency 01/19/2015   Iron deficiency anemia 01/19/2015   Abnormal kidney function 01/19/2015   Cervical pain 01/19/2015   Dyspnea, paroxysmal nocturnal 01/19/2015   Gastric ulcer 01/19/2015   Hive 01/19/2015   B12 deficiency 01/19/2015   Vitamin D deficiency 01/19/2015   Degenerative arthritis of finger 06/07/2014   Benign paroxysmal positional nystagmus 03/09/2009   HLD (hyperlipidemia) 11/03/2008   Fam hx-ischem heart disease 06/26/2008   History of tobacco use 06/26/2008   Essential (primary) hypertension 06/03/2008   Extreme obesity 06/03/2008   Apnea, sleep 06/03/2008   Morbid obesity ([place]) 06/03/2008   H/O total hysterectomy 04/30/1998    Past Medical History:  Diagnosis Date   Arthritis    neck, knees   Asthma    Cancer of the skin, basal cell    a.) s/p excision from neck, back, and foot   Carpal  tunnel syndrome    Colon polyps    Difficult intubation    Gastric ulcer    GERD (gastroesophageal reflux disease)    Gout    Hyperlipidemia    Hypertension    Iron deficiency anemia    OSA (obstructive sleep apnea)    a.) does not require nocturnal PAP therapy   Vertigo    last episode 12/2018   Wears dentures    partial upper     Transfusion: none   Consultants (if any):   Discharged Condition: Improved  Hospital Course: Sarah Marquez is an 66 y.o. female who was admitted 07/06/2021 with a diagnosis of S/P TKR (total knee replacement) using cement, left and went to the operating room on 07/06/2021 and underwent the above named procedures.    Surgeries: Procedure(s): TOTAL KNEE ARTHROPLASTY on 07/06/2021 Patient tolerated the surgery well. Taken to PACU where she was stabilized and then transferred to the orthopedic floor.  Started on Lovenox 30 mg q 12 hrs. Foot pumps applied bilaterally at 80 mm. Heels elevated on bed with rolled towels. No evidence of DVT. Negative Homan. Physical therapy started on day #1 for gait training and transfer. OT started day #1 for ADL and assisted devices.  Patient's foley was d/c on day #1. Patient's IV was d/c on day #1.  On post op day #1 patient was stable and ready for discharge to home with HHPT.    She was given perioperative antibiotics:  Anti-infectives (From admission, onward)    Start     Dose/Rate Route Frequency Ordered Stop  07/06/21 1645  ceFAZolin (ANCEF) IVPB 2g/100 mL premix        2 g 200 mL/hr over 30 Minutes Intravenous Every 6 hours 07/06/21 0947 07/06/21 2245   07/06/21 0615  ceFAZolin (ANCEF) 2-4 GM/100ML-% IVPB       Note to Pharmacy: Lyman Bishop T: cabinet override      07/06/21 0615 07/06/21 1714   07/06/21 0600  ceFAZolin (ANCEF) IVPB 2g/100 mL premix        2 g 200 mL/hr over 30 Minutes Intravenous On call to O.R. 07/06/21 3893 07/06/21 0813     .  She was given sequential compression devices, early  ambulation, and Lovenox TEDs for DVT prophylaxis.  She benefited maximally from the hospital stay and there were no complications.    Recent vital signs:  Vitals:   07/06/21 2009 07/07/21 0600  BP: 128/66 125/66  Pulse: 86 93  Resp: 18 17  Temp: 98.4 F (36.9 C) 99.7 F (37.6 C)  SpO2: 93% 93%    Recent laboratory studies:  Lab Results  Component Value Date   HGB 10.5 (L) 07/07/2021   HGB 10.7 (L) 07/06/2021   HGB 12.3 06/22/2021   Lab Results  Component Value Date   WBC 7.5 07/07/2021   PLT 141 (L) 07/07/2021   No results found for: INR Lab Results  Component Value Date   NA 135 07/07/2021   K 3.9 07/07/2021   CL 105 07/07/2021   CO2 26 07/07/2021   BUN 15 07/07/2021   CREATININE 0.76 07/07/2021   GLUCOSE 124 (H) 07/07/2021    Discharge Medications:   Allergies as of 07/07/2021       Reactions   Albuterol    Nebulizer form, causes wheezing   Beclomethasone Swelling   Beconase Nose spray swelled face up Currently uses Qvar on occasion and it does not cause problems   Sulfa Antibiotics Hives   Latex Rash   Adhesives, bandaids Paper tape is okay   Meloxicam Palpitations        Medication List     STOP taking these medications    naproxen sodium 220 MG tablet Commonly known as: ALEVE       TAKE these medications    beclomethasone 80 MCG/ACT inhaler Commonly known as: QVAR Inhale 2 puffs into the lungs 2 (two) times daily as needed (shortness of breath).   docusate sodium 100 MG capsule Commonly known as: COLACE Take 1 capsule (100 mg total) by mouth 2 (two) times daily.   enoxaparin 40 MG/0.4ML injection Commonly known as: LOVENOX Inject 0.4 mLs (40 mg total) into the skin daily for 14 days.   HYDROcodone-acetaminophen 7.5-325 MG tablet Commonly known as: NORCO Take 1-2 tablets by mouth every 4 (four) hours as needed for severe pain (pain score 7-10).   losartan 50 MG tablet Commonly known as: COZAAR TAKE 1 TABLET BY MOUTH EVERY  DAY   methocarbamol 500 MG tablet Commonly known as: ROBAXIN Take 1 tablet (500 mg total) by mouth every 6 (six) hours as needed for muscle spasms.   montelukast 10 MG tablet Commonly known as: SINGULAIR TAKE 1 TABLET (10 MG TOTAL) BY MOUTH DAILY.   nystatin 100000 UNIT/ML suspension Commonly known as: MYCOSTATIN Take 5 mLs by mouth in the morning and at bedtime. Apply to roof of mouth   ondansetron 4 MG tablet Commonly known as: ZOFRAN Take 1 tablet (4 mg total) by mouth every 6 (six) hours as needed for nausea.   pantoprazole 40 MG  tablet Commonly known as: PROTONIX Take 40 mg by mouth 2 (two) times daily.   polyethylene glycol 17 g packet Commonly known as: MIRALAX / GLYCOLAX Take 17 g by mouth daily as needed for mild constipation.   PRESERVISION AREDS 2 PO Take 1 tablet by mouth in the morning and at bedtime.   traMADol 50 MG tablet Commonly known as: ULTRAM Take 1 tablet (50 mg total) by mouth every 6 (six) hours as needed.   verapamil 180 MG CR tablet Commonly known as: CALAN-SR Take 1 tablet (180 mg total) by mouth daily.   Vitamin D (Ergocalciferol) 1.25 MG (50000 UNIT) Caps capsule Commonly known as: DRISDOL TAKE 1 CAPSULE (50,000 UNITS TOTAL) BY MOUTH EVERY 7 (SEVEN) DAYS. (TAKING ONE TABLET PER WEEK)        Diagnostic Studies: DG Knee 1-2 Views Left  Result Date: 07/06/2021 CLINICAL DATA:  Left knee arthroplasty EXAM: LEFT KNEE - 1-2 VIEW COMPARISON:  None. FINDINGS: Postsurgical changes from left total knee arthroplasty. Arthroplasty components are in their expected alignment. No periprosthetic fracture or evidence of other complication. Expected postoperative changes within the overlying soft tissues. IMPRESSION: Satisfactory postoperative appearance status post left total knee arthroplasty. Electronically Signed   By: Davina Poke D.O.   On: 07/06/2021 09:58    Disposition:      Follow-up Information     Duanne Guess, PA-C Follow up in 2  week(s).   Specialties: Orthopedic Surgery, Emergency Medicine Contact information: [place] [address] [phone]                  Signed: Feliberto Gottron 07/07/2021, 7:49 AM

## 2021-07-07 NOTE — Anesthesia Postprocedure Evaluation (Signed)
Anesthesia Post Note ? ?Patient: Sarah Marquez ? ?Procedure(s) Performed: TOTAL KNEE ARTHROPLASTY (Left: Knee) ? ?Patient location during evaluation: Nursing Unit ?Anesthesia Type: Spinal ?Level of consciousness: oriented and awake and alert ?Pain management: pain level controlled ?Vital Signs Assessment: post-procedure vital signs reviewed and stable ?Respiratory status: spontaneous breathing and respiratory function stable ?Cardiovascular status: blood pressure returned to baseline and stable ?Postop Assessment: no headache, no backache, no apparent nausea or vomiting and patient able to bend at knees ?Anesthetic complications: no ? ? ?No notable events documented. ? ? ?Last Vitals:  ?Vitals:  ? 07/06/21 2009 07/07/21 0600  ?BP: 128/66 125/66  ?Pulse: 86 93  ?Resp: 18 17  ?Temp: 36.9 ?C 37.6 ?C  ?SpO2: 93% 93%  ?  ?Last Pain:  ?Vitals:  ? 07/07/21 0600  ?TempSrc: Temporal  ?PainSc:   ? ? ?  ?  ?  ?  ?  ?  ? ?Doreen Salvage A ? ? ? ? ?

## 2021-07-07 NOTE — Discharge Instructions (Addendum)

## 2021-07-08 DIAGNOSIS — Z7901 Long term (current) use of anticoagulants: Secondary | ICD-10-CM | POA: Diagnosis not present

## 2021-07-08 DIAGNOSIS — E785 Hyperlipidemia, unspecified: Secondary | ICD-10-CM | POA: Diagnosis not present

## 2021-07-08 DIAGNOSIS — K219 Gastro-esophageal reflux disease without esophagitis: Secondary | ICD-10-CM | POA: Diagnosis not present

## 2021-07-08 DIAGNOSIS — M1990 Unspecified osteoarthritis, unspecified site: Secondary | ICD-10-CM | POA: Diagnosis not present

## 2021-07-08 DIAGNOSIS — Z471 Aftercare following joint replacement surgery: Secondary | ICD-10-CM | POA: Diagnosis not present

## 2021-07-08 DIAGNOSIS — Z8711 Personal history of peptic ulcer disease: Secondary | ICD-10-CM | POA: Diagnosis not present

## 2021-07-08 DIAGNOSIS — E8881 Metabolic syndrome: Secondary | ICD-10-CM | POA: Diagnosis not present

## 2021-07-08 DIAGNOSIS — Z9049 Acquired absence of other specified parts of digestive tract: Secondary | ICD-10-CM | POA: Diagnosis not present

## 2021-07-08 DIAGNOSIS — Z90710 Acquired absence of both cervix and uterus: Secondary | ICD-10-CM | POA: Diagnosis not present

## 2021-07-08 DIAGNOSIS — I10 Essential (primary) hypertension: Secondary | ICD-10-CM | POA: Diagnosis not present

## 2021-07-08 DIAGNOSIS — G5603 Carpal tunnel syndrome, bilateral upper limbs: Secondary | ICD-10-CM | POA: Diagnosis not present

## 2021-07-08 DIAGNOSIS — D509 Iron deficiency anemia, unspecified: Secondary | ICD-10-CM | POA: Diagnosis not present

## 2021-07-08 DIAGNOSIS — Z86010 Personal history of colonic polyps: Secondary | ICD-10-CM | POA: Diagnosis not present

## 2021-07-08 DIAGNOSIS — N951 Menopausal and female climacteric states: Secondary | ICD-10-CM | POA: Diagnosis not present

## 2021-07-08 DIAGNOSIS — Z9181 History of falling: Secondary | ICD-10-CM | POA: Diagnosis not present

## 2021-07-08 DIAGNOSIS — Z6841 Body Mass Index (BMI) 40.0 and over, adult: Secondary | ICD-10-CM | POA: Diagnosis not present

## 2021-07-08 DIAGNOSIS — Z96653 Presence of artificial knee joint, bilateral: Secondary | ICD-10-CM | POA: Diagnosis not present

## 2021-07-14 DIAGNOSIS — K219 Gastro-esophageal reflux disease without esophagitis: Secondary | ICD-10-CM | POA: Diagnosis not present

## 2021-07-14 DIAGNOSIS — D509 Iron deficiency anemia, unspecified: Secondary | ICD-10-CM | POA: Diagnosis not present

## 2021-07-14 DIAGNOSIS — Z8711 Personal history of peptic ulcer disease: Secondary | ICD-10-CM | POA: Diagnosis not present

## 2021-07-14 DIAGNOSIS — M1990 Unspecified osteoarthritis, unspecified site: Secondary | ICD-10-CM | POA: Diagnosis not present

## 2021-07-14 DIAGNOSIS — E8881 Metabolic syndrome: Secondary | ICD-10-CM | POA: Diagnosis not present

## 2021-07-14 DIAGNOSIS — Z7901 Long term (current) use of anticoagulants: Secondary | ICD-10-CM | POA: Diagnosis not present

## 2021-07-14 DIAGNOSIS — Z9181 History of falling: Secondary | ICD-10-CM | POA: Diagnosis not present

## 2021-07-14 DIAGNOSIS — Z90710 Acquired absence of both cervix and uterus: Secondary | ICD-10-CM | POA: Diagnosis not present

## 2021-07-14 DIAGNOSIS — Z86010 Personal history of colonic polyps: Secondary | ICD-10-CM | POA: Diagnosis not present

## 2021-07-14 DIAGNOSIS — E785 Hyperlipidemia, unspecified: Secondary | ICD-10-CM | POA: Diagnosis not present

## 2021-07-14 DIAGNOSIS — Z9049 Acquired absence of other specified parts of digestive tract: Secondary | ICD-10-CM | POA: Diagnosis not present

## 2021-07-14 DIAGNOSIS — G5603 Carpal tunnel syndrome, bilateral upper limbs: Secondary | ICD-10-CM | POA: Diagnosis not present

## 2021-07-14 DIAGNOSIS — Z471 Aftercare following joint replacement surgery: Secondary | ICD-10-CM | POA: Diagnosis not present

## 2021-07-14 DIAGNOSIS — Z96653 Presence of artificial knee joint, bilateral: Secondary | ICD-10-CM | POA: Diagnosis not present

## 2021-07-14 DIAGNOSIS — I10 Essential (primary) hypertension: Secondary | ICD-10-CM | POA: Diagnosis not present

## 2021-07-14 DIAGNOSIS — N951 Menopausal and female climacteric states: Secondary | ICD-10-CM | POA: Diagnosis not present

## 2021-07-14 DIAGNOSIS — Z6841 Body Mass Index (BMI) 40.0 and over, adult: Secondary | ICD-10-CM | POA: Diagnosis not present

## 2021-07-19 ENCOUNTER — Ambulatory Visit: Payer: PPO | Admitting: Family Medicine

## 2021-07-20 DIAGNOSIS — Z7901 Long term (current) use of anticoagulants: Secondary | ICD-10-CM | POA: Diagnosis not present

## 2021-07-20 DIAGNOSIS — Z471 Aftercare following joint replacement surgery: Secondary | ICD-10-CM | POA: Diagnosis not present

## 2021-07-20 DIAGNOSIS — G5603 Carpal tunnel syndrome, bilateral upper limbs: Secondary | ICD-10-CM | POA: Diagnosis not present

## 2021-07-20 DIAGNOSIS — Z6841 Body Mass Index (BMI) 40.0 and over, adult: Secondary | ICD-10-CM | POA: Diagnosis not present

## 2021-07-20 DIAGNOSIS — Z9049 Acquired absence of other specified parts of digestive tract: Secondary | ICD-10-CM | POA: Diagnosis not present

## 2021-07-20 DIAGNOSIS — Z9181 History of falling: Secondary | ICD-10-CM | POA: Diagnosis not present

## 2021-07-20 DIAGNOSIS — K219 Gastro-esophageal reflux disease without esophagitis: Secondary | ICD-10-CM | POA: Diagnosis not present

## 2021-07-20 DIAGNOSIS — N951 Menopausal and female climacteric states: Secondary | ICD-10-CM | POA: Diagnosis not present

## 2021-07-20 DIAGNOSIS — Z90710 Acquired absence of both cervix and uterus: Secondary | ICD-10-CM | POA: Diagnosis not present

## 2021-07-20 DIAGNOSIS — E8881 Metabolic syndrome: Secondary | ICD-10-CM | POA: Diagnosis not present

## 2021-07-20 DIAGNOSIS — D509 Iron deficiency anemia, unspecified: Secondary | ICD-10-CM | POA: Diagnosis not present

## 2021-07-20 DIAGNOSIS — Z8711 Personal history of peptic ulcer disease: Secondary | ICD-10-CM | POA: Diagnosis not present

## 2021-07-20 DIAGNOSIS — I10 Essential (primary) hypertension: Secondary | ICD-10-CM | POA: Diagnosis not present

## 2021-07-20 DIAGNOSIS — E785 Hyperlipidemia, unspecified: Secondary | ICD-10-CM | POA: Diagnosis not present

## 2021-07-20 DIAGNOSIS — Z86010 Personal history of colonic polyps: Secondary | ICD-10-CM | POA: Diagnosis not present

## 2021-07-20 DIAGNOSIS — M1990 Unspecified osteoarthritis, unspecified site: Secondary | ICD-10-CM | POA: Diagnosis not present

## 2021-07-20 DIAGNOSIS — Z96653 Presence of artificial knee joint, bilateral: Secondary | ICD-10-CM | POA: Diagnosis not present

## 2021-07-21 DIAGNOSIS — G8929 Other chronic pain: Secondary | ICD-10-CM | POA: Diagnosis not present

## 2021-07-21 DIAGNOSIS — M6281 Muscle weakness (generalized): Secondary | ICD-10-CM | POA: Diagnosis not present

## 2021-07-21 DIAGNOSIS — M25562 Pain in left knee: Secondary | ICD-10-CM | POA: Diagnosis not present

## 2021-07-21 DIAGNOSIS — M25662 Stiffness of left knee, not elsewhere classified: Secondary | ICD-10-CM | POA: Diagnosis not present

## 2021-07-21 DIAGNOSIS — Z96652 Presence of left artificial knee joint: Secondary | ICD-10-CM | POA: Diagnosis not present

## 2021-07-22 ENCOUNTER — Other Ambulatory Visit: Payer: Self-pay | Admitting: Family Medicine

## 2021-07-25 DIAGNOSIS — M6281 Muscle weakness (generalized): Secondary | ICD-10-CM | POA: Diagnosis not present

## 2021-07-25 DIAGNOSIS — Z96652 Presence of left artificial knee joint: Secondary | ICD-10-CM | POA: Diagnosis not present

## 2021-07-25 DIAGNOSIS — M25562 Pain in left knee: Secondary | ICD-10-CM | POA: Diagnosis not present

## 2021-07-25 DIAGNOSIS — G8929 Other chronic pain: Secondary | ICD-10-CM | POA: Diagnosis not present

## 2021-07-25 DIAGNOSIS — M25662 Stiffness of left knee, not elsewhere classified: Secondary | ICD-10-CM | POA: Diagnosis not present

## 2021-07-28 DIAGNOSIS — M6281 Muscle weakness (generalized): Secondary | ICD-10-CM | POA: Diagnosis not present

## 2021-07-28 DIAGNOSIS — M25562 Pain in left knee: Secondary | ICD-10-CM | POA: Diagnosis not present

## 2021-07-28 DIAGNOSIS — G8929 Other chronic pain: Secondary | ICD-10-CM | POA: Diagnosis not present

## 2021-07-28 DIAGNOSIS — M25662 Stiffness of left knee, not elsewhere classified: Secondary | ICD-10-CM | POA: Diagnosis not present

## 2021-07-28 DIAGNOSIS — Z96652 Presence of left artificial knee joint: Secondary | ICD-10-CM | POA: Diagnosis not present

## 2021-07-31 DIAGNOSIS — M25662 Stiffness of left knee, not elsewhere classified: Secondary | ICD-10-CM | POA: Diagnosis not present

## 2021-07-31 DIAGNOSIS — Z96652 Presence of left artificial knee joint: Secondary | ICD-10-CM | POA: Diagnosis not present

## 2021-07-31 DIAGNOSIS — M6281 Muscle weakness (generalized): Secondary | ICD-10-CM | POA: Diagnosis not present

## 2021-07-31 DIAGNOSIS — G8929 Other chronic pain: Secondary | ICD-10-CM | POA: Diagnosis not present

## 2021-07-31 DIAGNOSIS — M25562 Pain in left knee: Secondary | ICD-10-CM | POA: Diagnosis not present

## 2021-08-03 ENCOUNTER — Other Ambulatory Visit: Payer: Self-pay | Admitting: Family Medicine

## 2021-08-03 DIAGNOSIS — M25562 Pain in left knee: Secondary | ICD-10-CM | POA: Diagnosis not present

## 2021-08-03 DIAGNOSIS — G8929 Other chronic pain: Secondary | ICD-10-CM | POA: Diagnosis not present

## 2021-08-03 DIAGNOSIS — M6281 Muscle weakness (generalized): Secondary | ICD-10-CM | POA: Diagnosis not present

## 2021-08-03 DIAGNOSIS — M25662 Stiffness of left knee, not elsewhere classified: Secondary | ICD-10-CM | POA: Diagnosis not present

## 2021-08-03 DIAGNOSIS — Z96652 Presence of left artificial knee joint: Secondary | ICD-10-CM | POA: Diagnosis not present

## 2021-08-08 DIAGNOSIS — Z96652 Presence of left artificial knee joint: Secondary | ICD-10-CM | POA: Diagnosis not present

## 2021-08-10 DIAGNOSIS — Z96652 Presence of left artificial knee joint: Secondary | ICD-10-CM | POA: Diagnosis not present

## 2021-08-10 DIAGNOSIS — M6281 Muscle weakness (generalized): Secondary | ICD-10-CM | POA: Diagnosis not present

## 2021-08-15 DIAGNOSIS — Z96652 Presence of left artificial knee joint: Secondary | ICD-10-CM | POA: Diagnosis not present

## 2021-08-15 DIAGNOSIS — M6281 Muscle weakness (generalized): Secondary | ICD-10-CM | POA: Diagnosis not present

## 2021-08-15 DIAGNOSIS — M25662 Stiffness of left knee, not elsewhere classified: Secondary | ICD-10-CM | POA: Diagnosis not present

## 2021-08-15 DIAGNOSIS — G8929 Other chronic pain: Secondary | ICD-10-CM | POA: Diagnosis not present

## 2021-08-15 DIAGNOSIS — M25562 Pain in left knee: Secondary | ICD-10-CM | POA: Diagnosis not present

## 2021-08-17 DIAGNOSIS — Z96652 Presence of left artificial knee joint: Secondary | ICD-10-CM | POA: Diagnosis not present

## 2021-08-18 DIAGNOSIS — M1712 Unilateral primary osteoarthritis, left knee: Secondary | ICD-10-CM | POA: Diagnosis not present

## 2021-08-18 DIAGNOSIS — Z96652 Presence of left artificial knee joint: Secondary | ICD-10-CM | POA: Diagnosis not present

## 2021-09-05 DIAGNOSIS — R070 Pain in throat: Secondary | ICD-10-CM | POA: Diagnosis not present

## 2021-09-05 DIAGNOSIS — J069 Acute upper respiratory infection, unspecified: Secondary | ICD-10-CM | POA: Diagnosis not present

## 2021-09-20 ENCOUNTER — Other Ambulatory Visit: Payer: Self-pay | Admitting: Family Medicine

## 2021-10-05 DIAGNOSIS — D225 Melanocytic nevi of trunk: Secondary | ICD-10-CM | POA: Diagnosis not present

## 2021-10-05 DIAGNOSIS — D2261 Melanocytic nevi of right upper limb, including shoulder: Secondary | ICD-10-CM | POA: Diagnosis not present

## 2021-10-05 DIAGNOSIS — D2272 Melanocytic nevi of left lower limb, including hip: Secondary | ICD-10-CM | POA: Diagnosis not present

## 2021-10-05 DIAGNOSIS — D2262 Melanocytic nevi of left upper limb, including shoulder: Secondary | ICD-10-CM | POA: Diagnosis not present

## 2021-10-05 DIAGNOSIS — L821 Other seborrheic keratosis: Secondary | ICD-10-CM | POA: Diagnosis not present

## 2021-10-05 DIAGNOSIS — Z85828 Personal history of other malignant neoplasm of skin: Secondary | ICD-10-CM | POA: Diagnosis not present

## 2021-10-12 DIAGNOSIS — H353131 Nonexudative age-related macular degeneration, bilateral, early dry stage: Secondary | ICD-10-CM | POA: Diagnosis not present

## 2021-10-12 DIAGNOSIS — H26491 Other secondary cataract, right eye: Secondary | ICD-10-CM | POA: Diagnosis not present

## 2021-10-13 ENCOUNTER — Other Ambulatory Visit: Payer: Self-pay | Admitting: Unknown Physician Specialty

## 2021-10-13 ENCOUNTER — Ambulatory Visit
Admission: RE | Admit: 2021-10-13 | Discharge: 2021-10-13 | Disposition: A | Discharge status: Home / Self Care | Payer: PPO | Source: Ambulatory Visit | Attending: Unknown Physician Specialty | Admitting: Unknown Physician Specialty

## 2021-10-13 DIAGNOSIS — R059 Cough, unspecified: Secondary | ICD-10-CM

## 2021-10-17 NOTE — Progress Notes (Unsigned)
Annual Wellness Visit    I,April Miller,acting as a scribe for Wilhemena Durie, MD.,have documented all relevant documentation on the behalf of Wilhemena Durie, MD,as directed by  Wilhemena Durie, MD while in the presence of Wilhemena Durie, MD.   Patient: Sarah Marquez, Female    DOB: Nov 28, 1955, 66 y.o.   MRN: [id number] Visit Date: 10/18/2021  Today's Provider: Wilhemena Durie, MD   Chief Complaint  Patient presents with   Medicare Wellness   Subjective    Sarah Marquez is a 66 y.o. female who presents today for her Annual Wellness Visit.  Annual physical. She reports consuming a general diet. The patient does not participate in regular exercise at present. She generally feels well. She reports sleeping fairly well. She does not have additional problems to discuss today.   HPI    Medications: Outpatient Medications Prior to Visit  Medication Sig   beclomethasone (QVAR) 80 MCG/ACT inhaler Inhale 2 puffs into the lungs 2 (two) times daily as needed (shortness of breath).   docusate sodium (COLACE) 100 MG capsule Take 1 capsule (100 mg total) by mouth 2 (two) times daily.   losartan (COZAAR) 50 MG tablet TAKE 1 TABLET BY MOUTH EVERY DAY   montelukast (SINGULAIR) 10 MG tablet TAKE 1 TABLET (10 MG TOTAL) BY MOUTH DAILY.   Multiple Vitamins-Minerals (PRESERVISION AREDS 2 PO) Take 1 tablet by mouth in the morning and at bedtime.   pantoprazole (PROTONIX) 40 MG tablet Take 40 mg by mouth 2 (two) times daily.   verapamil (CALAN-SR) 180 MG CR tablet TAKE 1 TABLET BY MOUTH EVERY DAY   Vitamin D, Ergocalciferol, (DRISDOL) 1.25 MG (50000 UNIT) CAPS capsule TAKE 1 CAPSULE (50,000 UNITS TOTAL) BY MOUTH EVERY 7 (SEVEN) DAYS. (TAKING ONE TABLET PER WEEK)   [DISCONTINUED] enoxaparin (LOVENOX) 40 MG/0.4ML injection Inject 0.4 mLs (40 mg total) into the skin daily for 14 days.   [DISCONTINUED] HYDROcodone-acetaminophen (NORCO) 7.5-325 MG tablet Take 1-2 tablets by mouth every  4 (four) hours as needed for severe pain (pain score 7-10). (Patient not taking: Reported on 10/18/2021)   [DISCONTINUED] methocarbamol (ROBAXIN) 500 MG tablet Take 1 tablet (500 mg total) by mouth every 6 (six) hours as needed for muscle spasms. (Patient not taking: Reported on 10/18/2021)   [DISCONTINUED] nystatin (MYCOSTATIN) 100000 UNIT/ML suspension Take 5 mLs by mouth in the morning and at bedtime. Apply to roof of mouth (Patient not taking: Reported on 10/18/2021)   [DISCONTINUED] ondansetron (ZOFRAN) 4 MG tablet Take 1 tablet (4 mg total) by mouth every 6 (six) hours as needed for nausea. (Patient not taking: Reported on 10/18/2021)   [DISCONTINUED] polyethylene glycol (MIRALAX / GLYCOLAX) 17 g packet Take 17 g by mouth daily as needed for mild constipation. (Patient not taking: Reported on 10/18/2021)   [DISCONTINUED] traMADol (ULTRAM) 50 MG tablet Take 1 tablet (50 mg total) by mouth every 6 (six) hours as needed. (Patient not taking: Reported on 10/18/2021)   No facility-administered medications prior to visit.    Allergies  Allergen Reactions   Albuterol     Nebulizer form, causes wheezing   Beclomethasone Swelling    Beconase Nose spray swelled face up Currently uses Qvar on occasion and it does not cause problems   Sulfa Antibiotics Hives   Latex Rash    Adhesives, bandaids Paper tape is okay   Meloxicam Palpitations    Patient Care Team: Jerrol Banana., MD as PCP - General (Family  Medicine) Jerrol Banana., MD (Family Medicine)  Review of Systems  HENT:  Positive for congestion.   All other systems reviewed and are negative.   Last metabolic panel Lab Results  Component Value Date   GLUCOSE 124 (H) 07/07/2021   NA 135 07/07/2021   K 3.9 07/07/2021   CL 105 07/07/2021   CO2 26 07/07/2021   BUN 15 07/07/2021   CREATININE 0.76 07/07/2021   GFRNONAA >60 07/07/2021   CALCIUM 8.3 (L) 07/07/2021   PROT 6.8 06/22/2021   ALBUMIN 3.6 06/22/2021   LABGLOB  2.9 03/30/2020   AGRATIO 1.3 03/30/2020   BILITOT 1.1 06/22/2021   ALKPHOS 87 06/22/2021   AST 28 06/22/2021   ALT 13 06/22/2021   ANIONGAP 4 (L) 07/07/2021        Objective    Vitals: BP 122/64 (BP Location: Right Arm, Patient Position: Sitting, Cuff Size: Large)   Pulse 78   Resp 16   Ht '5\' 5"'$  (1.651 m)   Wt 242 lb (109.8 kg)   SpO2 99%   BMI 40.27 kg/m  BP Readings from Last 3 Encounters:  10/18/21 122/64  07/07/21 (!) 146/63  06/22/21 136/73   Wt Readings from Last 3 Encounters:  10/18/21 242 lb (109.8 kg)  07/06/21 259 lb 11.2 oz (117.8 kg)  06/22/21 259 lb 11.2 oz (117.8 kg)       Physical Exam ***  Most recent functional status assessment:    10/18/2021   10:31 AM  In your present state of health, do you have any difficulty performing the following activities:  Hearing? 0  Vision? 0  Difficulty concentrating or making decisions? 0  Walking or climbing stairs? 0  Dressing or bathing? 0  Doing errands, shopping? 0   Most recent fall risk assessment:    10/18/2021   10:30 AM  Fall Risk   Falls in the past year? 0  Number falls in past yr: 0  Injury with Fall? 0  Risk for fall due to : No Fall Risks  Follow up Falls evaluation completed    Most recent depression screenings:    10/18/2021   10:30 AM 11/26/2019   10:52 AM  PHQ 2/9 Scores  PHQ - 2 Score 0 0  PHQ- 9 Score 1    Most recent cognitive screening:    11/26/2019   10:53 AM  6CIT Screen  What Year? 0 points  What month? 0 points  What time? 0 points  Count back from 20 0 points  Months in reverse 0 points  Repeat phrase 0 points  Total Score 0 points   Most recent Audit-C alcohol use screening    10/18/2021   10:30 AM  Alcohol Use Disorder Test (AUDIT)  1. How often do you have a drink containing alcohol? 0  2. How many drinks containing alcohol do you have on a typical day when you are drinking? 0  3. How often do you have six or more drinks on one occasion? 0  AUDIT-C Score  0   A score of 3 or more in women, and 4 or more in men indicates increased risk for alcohol abuse, EXCEPT if all of the points are from question 1   No results found for any visits on 10/18/21.  Assessment & Plan     Annual wellness visit done today including the all of the following: Reviewed patient's Family Medical History Reviewed and updated list of patient's medical providers Assessment of cognitive impairment was  done Assessed patient's functional ability Established a written schedule for health screening services Health Risk Assessent Completed and Reviewed  Exercise Activities and Dietary recommendations  Goals   None     Immunization History  Administered Date(s) Administered   Fluad Quad(high Dose 65+) 02/17/2019, 03/10/2020, 01/26/2021   Influenza,inj,Quad PF,6+ Mos 02/16/2017, 01/14/2018   PFIZER(Purple Top)SARS-COV-2 Vaccination 07/02/2019, 07/23/2019, 04/14/2020   Tdap 02/05/2007    Health Maintenance  Topic Date Due   Pneumonia Vaccine 36+ Years old (1 - PCV) Never done   Hepatitis C Screening  Never done   Zoster Vaccines- Shingrix (1 of 2) Never done   TETANUS/TDAP  02/04/2017   COVID-19 Vaccine (4 - Pfizer series) 06/09/2020   INFLUENZA VACCINE  11/28/2021   MAMMOGRAM  01/26/2023   COLONOSCOPY (Pts 45-41yr Insurance coverage will need to be confirmed)  10/17/2027   DEXA SCAN  Completed   HPV VACCINES  Aged Out     Discussed health benefits of physical activity, and encouraged her to engage in regular exercise appropriate for her age and condition.    1. Encounter for Medicare annual wellness exam   2. Essential (primary) hypertension  - Lipid panel - TSH - Comprehensive Metabolic Panel (CMET) - CBC w/Diff/Platelet  3. Hyperlipidemia, unspecified hyperlipidemia type  - Lipid panel - TSH - Comprehensive Metabolic Panel (CMET) - CBC w/Diff/Platelet  4. Class 3 severe obesity due to excess calories with serious comorbidity and body  mass index (BMI) of 45.0 to 49.9 in adult (HCC)  - Lipid panel - TSH - Comprehensive Metabolic Panel (CMET) - CBC w/Diff/Platelet - HgB A1c  5. OSA (obstructive sleep apnea)  - Lipid panel - TSH - Comprehensive Metabolic Panel (CMET) - CBC w/Diff/Platelet  6. ASCVD (arteriosclerotic cardiovascular disease)  - Lipid panel - TSH - Comprehensive Metabolic Panel (CMET) - CBC w/Diff/Platelet  7. Iron deficiency anemia, unspecified iron deficiency anemia type  - Lipid panel - TSH - Comprehensive Metabolic Panel (CMET) - CBC w/Diff/Platelet  8. Osteoarthritis, unspecified osteoarthritis type, unspecified site  - Lipid panel - TSH - Comprehensive Metabolic Panel (CMET) - CBC w/Diff/Platelet  9. Hyperglycemia  - HgB A1c   Return in about 6 months (around 04/19/2022).     {provider attestation***:1}   RWilhemena Durie MD  [phone](phone) [phone](fax)  [place]

## 2021-10-18 ENCOUNTER — Encounter: Payer: Self-pay | Admitting: Family Medicine

## 2021-10-18 ENCOUNTER — Ambulatory Visit (INDEPENDENT_AMBULATORY_CARE_PROVIDER_SITE_OTHER): Payer: PPO | Admitting: Family Medicine

## 2021-10-18 VITALS — BP 122/64 | HR 78 | Resp 16 | Ht 65.0 in | Wt 242.0 lb

## 2021-10-18 DIAGNOSIS — R739 Hyperglycemia, unspecified: Secondary | ICD-10-CM | POA: Diagnosis not present

## 2021-10-18 DIAGNOSIS — G4733 Obstructive sleep apnea (adult) (pediatric): Secondary | ICD-10-CM

## 2021-10-18 DIAGNOSIS — Z6841 Body Mass Index (BMI) 40.0 and over, adult: Secondary | ICD-10-CM

## 2021-10-18 DIAGNOSIS — Z0000 Encounter for general adult medical examination without abnormal findings: Secondary | ICD-10-CM

## 2021-10-18 DIAGNOSIS — E785 Hyperlipidemia, unspecified: Secondary | ICD-10-CM

## 2021-10-18 DIAGNOSIS — D509 Iron deficiency anemia, unspecified: Secondary | ICD-10-CM | POA: Diagnosis not present

## 2021-10-18 DIAGNOSIS — I10 Essential (primary) hypertension: Secondary | ICD-10-CM

## 2021-10-18 DIAGNOSIS — I2510 Atherosclerotic heart disease of native coronary artery without angina pectoris: Secondary | ICD-10-CM | POA: Diagnosis not present

## 2021-10-18 DIAGNOSIS — M1990 Unspecified osteoarthritis, unspecified site: Secondary | ICD-10-CM | POA: Diagnosis not present

## 2021-10-19 LAB — COMPREHENSIVE METABOLIC PANEL
ALT: 14 IU/L (ref 0–32)
AST: 20 IU/L (ref 0–40)
Albumin/Globulin Ratio: 1.3 (ref 1.2–2.2)
Albumin: 3.6 g/dL — ABNORMAL LOW (ref 3.8–4.8)
Alkaline Phosphatase: 113 IU/L (ref 44–121)
BUN/Creatinine Ratio: 13 (ref 12–28)
BUN: 10 mg/dL (ref 8–27)
Bilirubin Total: 0.9 mg/dL (ref 0.0–1.2)
CO2: 24 mmol/L (ref 20–29)
Calcium: 9.2 mg/dL (ref 8.7–10.3)
Chloride: 105 mmol/L (ref 96–106)
Creatinine, Ser: 0.8 mg/dL (ref 0.57–1.00)
Globulin, Total: 2.7 g/dL (ref 1.5–4.5)
Glucose: 91 mg/dL (ref 70–99)
Potassium: 4.2 mmol/L (ref 3.5–5.2)
Sodium: 143 mmol/L (ref 134–144)
Total Protein: 6.3 g/dL (ref 6.0–8.5)
eGFR: 81 mL/min/{1.73_m2} (ref >59)

## 2021-10-19 LAB — CBC WITH DIFFERENTIAL/PLATELET
Basophils Absolute: 0 10*3/uL (ref 0.0–0.2)
Basos: 0 %
EOS (ABSOLUTE): 0.4 10*3/uL (ref 0.0–0.4)
Eos: 7 %
Hematocrit: 38.1 % (ref 34.0–46.6)
Hemoglobin: 12.2 g/dL (ref 11.1–15.9)
Immature Grans (Abs): 0 10*3/uL (ref 0.0–0.1)
Immature Granulocytes: 0 %
Lymphocytes Absolute: 1.2 10*3/uL (ref 0.7–3.1)
Lymphs: 20 %
MCH: 25.6 pg — ABNORMAL LOW (ref 26.6–33.0)
MCHC: 32 g/dL (ref 31.5–35.7)
MCV: 80 fL (ref 79–97)
Monocytes Absolute: 0.4 10*3/uL (ref 0.1–0.9)
Monocytes: 6 %
Neutrophils Absolute: 3.8 10*3/uL (ref 1.4–7.0)
Neutrophils: 67 %
Platelets: 198 10*3/uL (ref 150–450)
RBC: 4.76 x10E6/uL (ref 3.77–5.28)
RDW: 16.6 % — ABNORMAL HIGH (ref 11.7–15.4)
WBC: 5.7 10*3/uL (ref 3.4–10.8)

## 2021-10-19 LAB — LIPID PANEL
Chol/HDL Ratio: 4.1 ratio (ref 0.0–4.4)
Cholesterol, Total: 182 mg/dL (ref 100–199)
HDL: 44 mg/dL (ref >39)
LDL Chol Calc (NIH): 117 mg/dL — ABNORMAL HIGH (ref 0–99)
Triglycerides: 114 mg/dL (ref 0–149)
VLDL Cholesterol Cal: 21 mg/dL (ref 5–40)

## 2021-10-19 LAB — HEMOGLOBIN A1C
Est. average glucose Bld gHb Est-mCnc: 111 mg/dL
Hgb A1c MFr Bld: 5.5 % (ref 4.8–5.6)

## 2021-10-19 LAB — TSH: TSH: 2.06 u[IU]/mL (ref 0.450–4.500)

## 2021-10-19 IMAGING — DX DG KNEE 1-2V*R*
2 series · 2 of 2 positions shown · non-contrast
Comparison: None.

CLINICAL DATA: Postop knee replacement.

EXAM:
RIGHT KNEE - 1-2 VIEW

[knee ap]
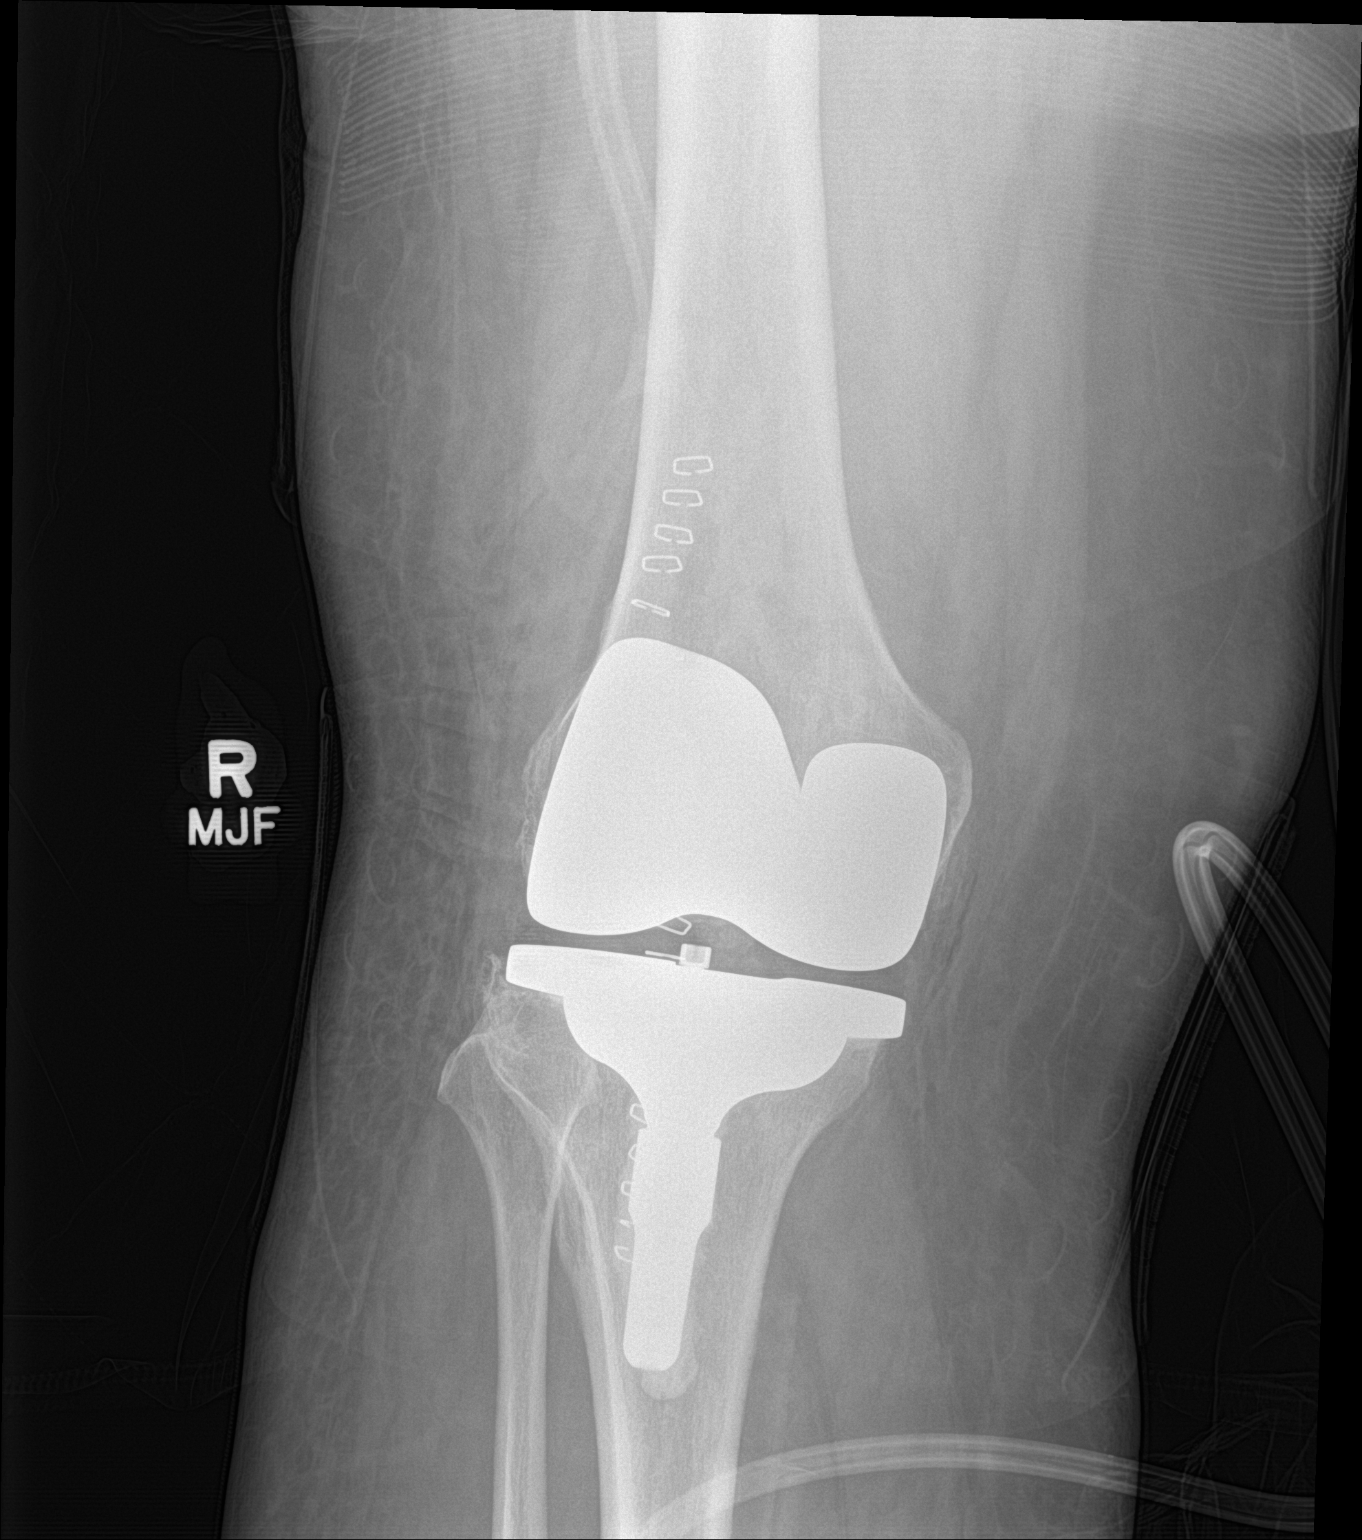

[knee lat]
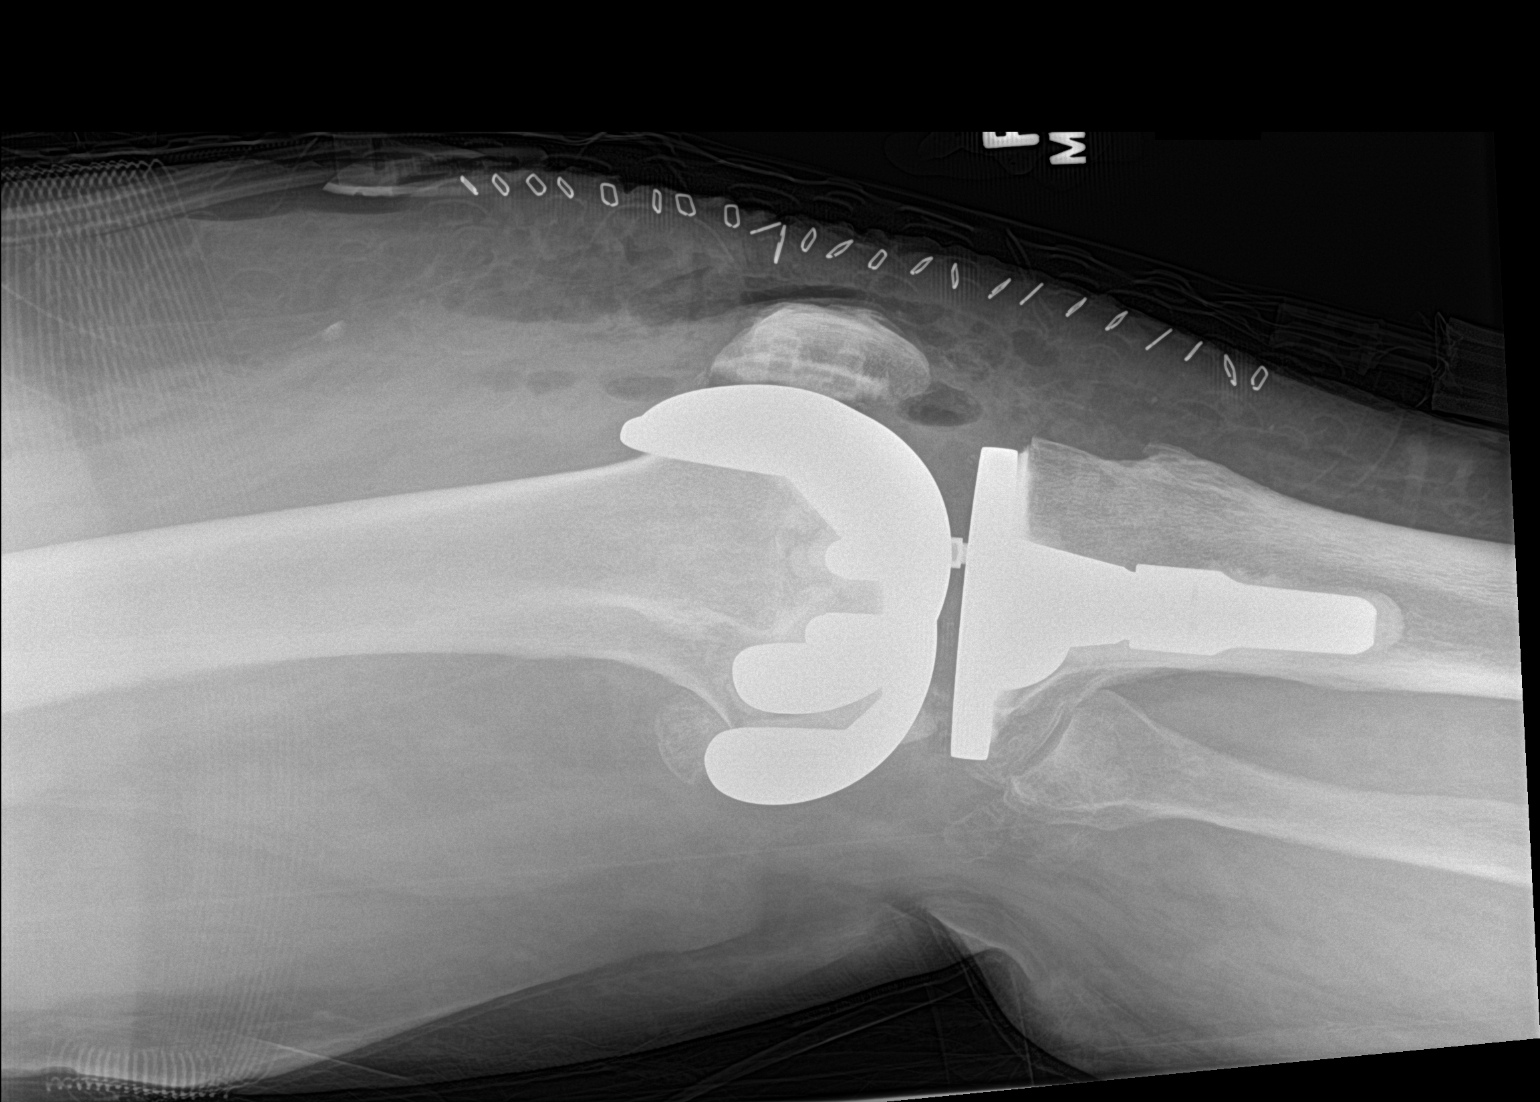

[2 of 2 positions shown; findings below may reference images not displayed]

FINDINGS: Sequelae of right total knee arthroplasty are identified. The
prosthetic components appear normally located. No acute fracture is
identified. Postoperative gas is noted in the knee joint and
surrounding soft tissues, and skin staples are in place. There is
generalized soft tissue swelling about the knee.
IMPRESSION: Right total knee arthroplasty without evidence of acute
complication.

## 2021-12-20 ENCOUNTER — Ambulatory Visit: Payer: Self-pay

## 2021-12-20 NOTE — Patient Instructions (Signed)
Visit Information  Thank you for taking time to visit with me today. Please don't hesitate to contact me if I can be of assistance to you.   Following are the goals we discussed today:   Goals Addressed             This Visit's Progress    RNCM: Effective Management of GI sx and sx       Care Coordination Interventions:  BP Readings from Last 3 Encounters:  10/18/21 122/64  07/07/21 (!) 146/63  06/22/21 136/73    Evaluation of current treatment plan related to diarrhea and patient's adherence to plan as established by provider. 12-20-2021: The patient states that she had a bad day on Saturday with diarrhea. The patient states that she is better now but last year it was bad after her surgery and she had to get prescription medications to help stop the diarrhea. Education on staying hydrated and eating well. Education on calling the office if an appointment is needed. Advised patient to call the office for worsening sx and sx of diarrhea or other questions or concerns related to chronic conditions Provided education to patient re: staying hydrated and eating well, use of OTC medications to help with diarrhea Reviewed medications with patient and discussed the patient states compliance with medications. She had to get a prescription last year when having knee replacement for relief of her diarrhea. She said that she may have to call her GI provider but it has been better each day. Education and support given Provided patient with diarrhea and general GI health educational materials related to GI sx and sx Reviewed scheduled/upcoming provider appointments including 04-26-2022 at 1040 am with pcp. Also education provided that if she needs to be seen sooner to call the office for an appointment.  Discussed plans with patient for ongoing care management follow up and provided patient with direct contact information for care management team Advised patient to discuss changes in her GI health or  other changes in chronic conditions  with provider Screening for signs and symptoms of depression related to chronic disease state  Assessed social determinant of health barriers AWV completed on 08-17-2021           Our next appointment is by telephone on 03-20-2022 at 1 pm  Please call the care guide team at [phone] if you need to cancel or reschedule your appointment.   If you are experiencing a Mental Health or [place] or need someone to talk to, please call the Suicide and Crisis Lifeline: 988 call the [place] National Suicide Prevention Lifeline: 562-467-5184 or TTY: (770)430-6308 TTY 5311717551) to talk to a trained counselor call 1-800-273-TALK (toll free, 24 hour hotline)  Patient verbalizes understanding of instructions and care plan provided today and agrees to view in [facility]. Active MyChart status and patient understanding of how to access instructions and care plan via MyChart confirmed with patient.     Telephone follow up appointment with care management team member scheduled for: 03-20-2022 at 1 pm  Noreene Larsson RN, MSN, [facility] Mobile: [phone]    Diarrhea, Adult Diarrhea is when you pass loose and watery poop (stool) often. Diarrhea can make you feel weak and cause you to lose water in your body (get dehydrated). Losing water in your body can cause you to: Feel tired and thirsty. Have a dry mouth. Go pee (urinate) less often. Diarrhea often lasts 2-3 days. However, it can last  longer if it is a sign of something more serious. It is important to treat your diarrhea as told by your doctor. Follow these instructions at home: Eating and drinking     Follow these instructions as told by your doctor: Take an ORS (oral rehydration solution). This is a drink that helps you replace fluids and minerals your body lost. It is sold at pharmacies and stores. Drink plenty of fluids, such  as: Water. Ice chips. Diluted fruit juice. Low-calorie sports drinks. Milk, if you want. Avoid drinking fluids that have a lot of sugar or caffeine in them. Eat bland, easy-to-digest foods in small amounts as you are able. These foods include: Bananas. Applesauce. Rice. Low-fat (lean) meats. Toast. Crackers. Avoid alcohol. Avoid spicy or fatty foods.  Medicines Take over-the-counter and prescription medicines only as told by your doctor. If you were prescribed an antibiotic medicine, take it as told by your doctor. Do not stop using the antibiotic even if you start to feel better. General instructions  Wash your hands often using soap and water. If soap and water are not available, use a hand sanitizer. Others in your home should wash their hands as well. Hands should be washed: After using the toilet or changing a diaper. Before preparing, cooking, or serving food. While caring for a sick person. While visiting someone in a hospital. Drink enough fluid to keep your pee (urine) pale yellow. Rest at home while you get better. Take a warm bath to help with any burning or pain from having diarrhea. Watch your condition for any changes. Keep all follow-up visits as told by your doctor. This is important. Contact a doctor if: You have a fever. Your diarrhea gets worse. You have new symptoms. You cannot keep fluids down. You feel light-headed or dizzy. You have a headache. You have muscle cramps. Get help right away if: You have chest pain. You feel very weak or you pass out (faint). You have bloody or black poop or poop that looks like tar. You have very bad pain, cramping, or bloating in your belly (abdomen). You have trouble breathing or you are breathing very quickly. Your heart is beating very quickly. Your skin feels cold and clammy. You feel confused. You have signs of losing too much water in your body, such as: Dark pee, very little pee, or no pee. Cracked  lips. Dry mouth. Sunken eyes. Sleepiness. Weakness. Summary Diarrhea is when you pass loose and watery poop (stool) often. Diarrhea can make you feel weak and cause you to lose water in your body (get dehydrated). Take an ORS (oral rehydration solution). This is a drink that is sold at pharmacies and stores. Eat bland, easy-to-digest foods in small amounts as you are able. Contact a doctor if your condition gets worse. Get help right away if you have signs that you have lost too much water in your body. This information is not intended to replace advice given to you by your health care provider. Make sure you discuss any questions you have with your health care provider. Document Revised: 07/07/2021 Document Reviewed: 10/26/2020 Elsevier Patient Education  [place].

## 2021-12-20 NOTE — Patient Outreach (Signed)
  Care Coordination   Initial Visit Note   12/20/2021 Name: Sarah Marquez MRN: [id number] DOB: [birth year]  Sarah Marquez is a 66 y.o. year old female who sees Sarah Marquez., MD for primary care. I spoke with  Sarah Marquez by phone today  What matters to the patients health and wellness today?  The patient is having issues with diarrhea right now    Goals Addressed             This Visit's Progress    RNCM: Effective Management of GI sx and sx       Care Coordination Interventions:  BP Readings from Last 3 Encounters:  10/18/21 122/64  07/07/21 (!) 146/63  06/22/21 136/73    Evaluation of current treatment plan related to diarrhea and patient's adherence to plan as established by provider. 12-20-2021: The patient states that she had a bad day on Saturday with diarrhea. The patient states that she is better now but last year it was bad after her surgery and she had to get prescription medications to help stop the diarrhea. Education on staying hydrated and eating well. Education on calling the office if an appointment is needed. Advised patient to call the office for worsening sx and sx of diarrhea or other questions or concerns related to chronic conditions Provided education to patient re: staying hydrated and eating well, use of OTC medications to help with diarrhea Reviewed medications with patient and discussed the patient states compliance with medications. She had to get a prescription last year when having knee replacement for relief of her diarrhea. She said that she may have to call her GI provider but it has been better each day. Education and support given Provided patient with diarrhea and general GI health educational materials related to GI sx and sx Reviewed scheduled/upcoming provider appointments including 04-26-2022 at 1040 am with pcp. Also education provided that if she needs to be seen sooner to call the office for an appointment.  Discussed plans with  patient for ongoing care management follow up and provided patient with direct contact information for care management team Advised patient to discuss changes in her GI health or other changes in chronic conditions  with provider Screening for signs and symptoms of depression related to chronic disease state  Assessed social determinant of health barriers AWV completed on 08-17-2021           SDOH assessments and interventions completed:  Yes  SDOH Interventions Today    Flowsheet Row Most Recent Value  SDOH Interventions   Food Insecurity Interventions Intervention Not Indicated  Housing Interventions Intervention Not Indicated  Physical Activity Interventions Intervention Not Indicated, Other (Comments)  [no structured activity, encouraged activity]  Stress Interventions Intervention Not Indicated  Social Connections Interventions Intervention Not Indicated  Transportation Interventions Intervention Not Indicated        Care Coordination Interventions Activated:  Yes  Care Coordination Interventions:  Yes, provided   Follow up plan: Follow up call scheduled for 03-20-2022 at 1 pm    Encounter Outcome:  Pt. Visit Completed   Sarah Larsson RN, MSN, [facility] Mobile: [phone]

## 2022-01-03 ENCOUNTER — Other Ambulatory Visit: Payer: Self-pay

## 2022-01-03 MED ORDER — VERAPAMIL HCL ER 180 MG PO TBCR: 180.0000 mg | EXTENDED_RELEASE_TABLET | Freq: Every day | ORAL | 3 refills | Status: DC

## 2022-01-03 MED ORDER — LOSARTAN POTASSIUM 50 MG PO TABS: 50.0000 mg | ORAL_TABLET | Freq: Every day | ORAL | 3 refills | Status: AC

## 2022-01-19 ENCOUNTER — Other Ambulatory Visit: Payer: Self-pay | Admitting: Obstetrics and Gynecology

## 2022-01-19 DIAGNOSIS — Z1231 Encounter for screening mammogram for malignant neoplasm of breast: Secondary | ICD-10-CM

## 2022-02-12 ENCOUNTER — Ambulatory Visit
Admission: RE | Admit: 2022-02-12 | Discharge: 2022-02-12 | Disposition: A | Discharge status: Home / Self Care | Payer: PPO | Source: Ambulatory Visit | Attending: Obstetrics and Gynecology | Admitting: Obstetrics and Gynecology

## 2022-02-12 DIAGNOSIS — Z1231 Encounter for screening mammogram for malignant neoplasm of breast: Secondary | ICD-10-CM | POA: Insufficient documentation

## 2022-02-22 DIAGNOSIS — M1711 Unilateral primary osteoarthritis, right knee: Secondary | ICD-10-CM | POA: Diagnosis not present

## 2022-02-22 DIAGNOSIS — Z96651 Presence of right artificial knee joint: Secondary | ICD-10-CM | POA: Diagnosis not present

## 2022-02-26 NOTE — Progress Notes (Unsigned)
PCP: Jerrol Banana., MD   No chief complaint on file.   HPI:      Sarah Marquez is a 66 y.o. No obstetric history on file. who LMP was No LMP recorded. Patient has had a hysterectomy., presents today for her annual examination.  Her menses are absent due to TAH due to leio/menorrhagia. She does not have PMB. She does not have vasomotor sx.   Sex activity: not currently sex active d/t husband - post menopausal status. She does not have vaginal dryness.  Last Pap: 01/15/18 Results were: no abnormalities /neg HPV DNA Hx of STDs: none  Last mammogram: 02/12/22 Results were: normal--routine follow-up in 12 months There is a FH of breast cancer in her mom, genetic testing not indicated. Pt had genetic testing done a few yrs ago for anemia at [facility] and thinks cancer testing done at that time, too. No notes available. There is no FH of ovarian cancer. The patient does do self-breast exams.  Colonoscopy: 2019, no abnormalities.  Repeat due after 5 or 10 years--pt unsure.  DEXA getting sched by PCP  Tobacco use: The patient denies current or previous tobacco use. Alcohol use: none  No drug use Exercise: min active  She does not get adequate calcium and Vitamin D in her diet.  Labs with PCP.   Past Medical History:  Diagnosis Date   Arthritis    neck, knees   Asthma    Cancer of the skin, basal cell    a.) s/p excision from neck, back, and foot   Carpal tunnel syndrome    Colon polyps    Difficult intubation    Gastric ulcer    GERD (gastroesophageal reflux disease)    Gout    Hyperlipidemia    Hypertension    Iron deficiency anemia    OSA (obstructive sleep apnea)    a.) does not require nocturnal PAP therapy   Vertigo    last episode 12/2018   Wears dentures    partial upper    Past Surgical History:  Procedure Laterality Date   ABDOMINAL HYSTERECTOMY     APPENDECTOMY     BACK SURGERY     x2 FOR RUPTURED DISC   CARDIAC CATHETERIZATION   1990's   CATARACT EXTRACTION W/PHACO Left 04/07/2019   Procedure: CATARACT EXTRACTION PHACO AND INTRAOCULAR LENS PLACEMENT (IOC) LEFT 4.43, 00:34.1;  Surgeon: Birder Robson, MD;  Location: [facility];  Service: Ophthalmology;  Laterality: Left;  Sleep Apnea-does not wear CPAP Latex-adhesives   CATARACT EXTRACTION W/PHACO Right 05/05/2019   Procedure: CATARACT EXTRACTION PHACO AND INTRAOCULAR LENS PLACEMENT (IOC) RIGHT;  4.83, 00:33.4;  Surgeon: Birder Robson, MD;  Location: [facility];  Service: Ophthalmology;  Laterality: Right;  SLEP APNEA-CPAP   CHOLECYSTECTOMY     COLONOSCOPY     COLONOSCOPY WITH PROPOFOL N/A 10/16/2017   Procedure: COLONOSCOPY WITH PROPOFOL;  Surgeon: Manya Silvas, MD;  Location[facility] ENDOSCOPY;  Service: Endoscopy;  Laterality: N/A;   ESOPHAGOGASTRODUODENOSCOPY     ESOPHAGOGASTRODUODENOSCOPY (EGD) WITH PROPOFOL N/A 10/16/2017   Procedure: ESOPHAGOGASTRODUODENOSCOPY (EGD) WITH PROPOFOL;  Surgeon: Manya Silvas, MD;  Location: [facility] ENDOSCOPY;  Service: Endoscopy;  Laterality: N/A;   FUNCTIONAL ENDOSCOPIC SINUS SURGERY     knee arthoscopy     LUMBAR DISC SURGERY     ruptured disc x 2   TONSILLECTOMY AND ADENOIDECTOMY     TOTAL KNEE ARTHROPLASTY Right 10/25/2020   Procedure: TOTAL KNEE ARTHROPLASTY - Gerald Stabs  Arvella Nigh to assist;  Surgeon: Hessie Knows, MD;  Location: [facility];  Service: Orthopedics;  Laterality: Right;   TOTAL KNEE ARTHROPLASTY Left 07/06/2021   Procedure: TOTAL KNEE ARTHROPLASTY;  Surgeon: Hessie Knows, MD;  Location: [facility];  Service: Orthopedics;  Laterality: Left;    Family History  Problem Relation Age of Onset   Breast cancer Mother 51   Heart attack Father    Heart attack Brother     Social History   Socioeconomic History   Marital status: Married    Spouse name: Elberta Fortis   Number of children: Not on file   Years of education: Not on file   Highest education level: Not on file  Occupational History    Occupation: variety of office jobs    Comment: retired  Tobacco Use   Smoking status: Never   Smokeless tobacco: Never   Tobacco comments:    former social smoker  Scientific laboratory technician Use: Never used  Substance and Sexual Activity   Alcohol use: No    Alcohol/week: 0.0 standard drinks of alcohol   Drug use: No   Sexual activity: Yes    Birth control/protection: Post-menopausal  Other Topics Concern   Not on file  Social History Narrative   Patient lives with husband and an older son.   Patient feels safe in home. Son has Muscular dystrophy and they have a ramp.   Social Determinants of Health   Financial Resource Strain: Not on file  Food Insecurity: No Food Insecurity (12/20/2021)   Hunger Vital Sign    Worried About Running Out of Food in the Last Year: Never true    Ran Out of Food in the Last Year: Never true  Transportation Needs: No Transportation Needs (12/20/2021)   PRAPARE - Hydrologist (Medical): No    Lack of Transportation (Non-Medical): No  Physical Activity: Inactive (12/20/2021)   Exercise Vital Sign    Days of Exercise per Week: 0 days    Minutes of Exercise per Session: 0 min  Stress: No Stress Concern Present (12/20/2021)   [place]    Feeling of Stress : Not at all  Social Connections: [place] (12/20/2021)   Social Connection and Isolation Panel [NHANES]    Frequency of Communication with Friends and Family: More than three times a week    Frequency of Social Gatherings with Friends and Family: More than three times a week    Attends Religious Services: More than 4 times per year    Active Member of [employer] or Organizations: Yes    Attends Music therapist: More than 4 times per year    Marital Status: Married  Human resources officer Violence: Not At Risk (12/20/2021)   Humiliation, Afraid, Rape, and Kick questionnaire    Fear of Current or  Ex-Partner: No    Emotionally Abused: No    Physically Abused: No    Sexually Abused: No    Outpatient Medications Prior to Visit  Medication Sig Dispense Refill   beclomethasone (QVAR) 80 MCG/ACT inhaler Inhale 2 puffs into the lungs 2 (two) times daily as needed (shortness of breath).     docusate sodium (COLACE) 100 MG capsule Take 1 capsule (100 mg total) by mouth 2 (two) times daily. 10 capsule 0   losartan (COZAAR) 50 MG tablet Take 1 tablet (50 mg total) by mouth daily. 90 tablet 3   montelukast (SINGULAIR) 10 MG tablet  TAKE 1 TABLET (10 MG TOTAL) BY MOUTH DAILY. 30 tablet 2   Multiple Vitamins-Minerals (PRESERVISION AREDS 2 PO) Take 1 tablet by mouth in the morning and at bedtime.     pantoprazole (PROTONIX) 40 MG tablet Take 40 mg by mouth 2 (two) times daily.     verapamil (CALAN-SR) 180 MG CR tablet Take 1 tablet (180 mg total) by mouth daily. 90 tablet 3   Vitamin D, Ergocalciferol, (DRISDOL) 1.25 MG (50000 UNIT) CAPS capsule TAKE 1 CAPSULE (50,000 UNITS TOTAL) BY MOUTH EVERY 7 (SEVEN) DAYS. (TAKING ONE TABLET PER WEEK) 4 capsule 5   No facility-administered medications prior to visit.     ROS:  Review of Systems  Constitutional:  Negative for fatigue, fever and unexpected weight change.  Respiratory:  Negative for cough, shortness of breath and wheezing.   Cardiovascular:  Negative for chest pain, palpitations and leg swelling.  Gastrointestinal:  Negative for blood in stool, constipation, diarrhea, nausea and vomiting.  Endocrine: Negative for cold intolerance, heat intolerance and polyuria.  Genitourinary:  Negative for dyspareunia, dysuria, flank pain, frequency, genital sores, hematuria, menstrual problem, pelvic pain, urgency, vaginal bleeding, vaginal discharge and vaginal pain.  Musculoskeletal:  Positive for arthralgias. Negative for back pain, joint swelling and myalgias.  Skin:  Negative for rash.  Neurological:  Negative for dizziness, syncope,  light-headedness, numbness and headaches.  Hematological:  Negative for adenopathy.  Psychiatric/Behavioral:  Negative for agitation, confusion, sleep disturbance and suicidal ideas. The patient is not nervous/anxious.   BREAST: No symptoms    Objective: There were no vitals taken for this visit.   Physical Exam Constitutional:      Appearance: She is well-developed.  Genitourinary:     Vulva normal.     Genitourinary Comments: UTERUS/CX SURG REM     No vaginal discharge, erythema or tenderness.      Right Adnexa: not tender and no mass present.    Left Adnexa: not tender and no mass present.    Cervix is absent.     Uterus is not tender.     Uterus is absent.  Breasts:    Right: No mass, nipple discharge, skin change or tenderness.     Left: No mass, nipple discharge, skin change or tenderness.  Neck:     Thyroid: No thyromegaly.  Cardiovascular:     Rate and Rhythm: Normal rate and regular rhythm.     Heart sounds: Normal heart sounds. No murmur heard. Pulmonary:     Effort: Pulmonary effort is normal.     Breath sounds: Normal breath sounds.  Abdominal:     Palpations: Abdomen is soft.     Tenderness: There is no abdominal tenderness. There is no guarding.  Musculoskeletal:        General: Normal range of motion.     Cervical back: Normal range of motion.  Neurological:     General: No focal deficit present.     Mental Status: She is alert and oriented to person, place, and time.     Cranial Nerves: No cranial nerve deficit.  Skin:    General: Skin is warm and dry.  Psychiatric:        Mood and Affect: Mood normal.        Behavior: Behavior normal.        Thought Content: Thought content normal.        Judgment: Judgment normal.  Vitals reviewed.     Assessment/Plan:  Encounter for annual routine gynecological examination  Encounter  for screening mammogram for malignant neoplasm of breast; pt current on mammo         GYN counsel adequate intake of  calcium and vitamin D, diet and exercise    F/U  No follow-ups on file.  Sarah Coye B. Saafir Abdullah, PA-C 02/26/2022 2:14 PM

## 2022-02-27 ENCOUNTER — Other Ambulatory Visit (HOSPITAL_COMMUNITY)
Admission: RE | Admit: 2022-02-27 | Discharge: 2022-02-27 | Disposition: A | Discharge status: Home / Self Care | Payer: PPO | Source: Ambulatory Visit | Attending: Obstetrics and Gynecology | Admitting: Obstetrics and Gynecology

## 2022-02-27 ENCOUNTER — Ambulatory Visit (INDEPENDENT_AMBULATORY_CARE_PROVIDER_SITE_OTHER): Payer: PPO | Admitting: Obstetrics and Gynecology

## 2022-02-27 ENCOUNTER — Encounter: Payer: Self-pay | Admitting: Obstetrics and Gynecology

## 2022-02-27 VITALS — BP 127/67 | HR 77 | Ht 65.0 in | Wt 248.0 lb

## 2022-02-27 DIAGNOSIS — Z01419 Encounter for gynecological examination (general) (routine) without abnormal findings: Secondary | ICD-10-CM | POA: Diagnosis not present

## 2022-02-27 DIAGNOSIS — Z1231 Encounter for screening mammogram for malignant neoplasm of breast: Secondary | ICD-10-CM

## 2022-02-27 DIAGNOSIS — Z124 Encounter for screening for malignant neoplasm of cervix: Secondary | ICD-10-CM | POA: Insufficient documentation

## 2022-02-27 DIAGNOSIS — Z1211 Encounter for screening for malignant neoplasm of colon: Secondary | ICD-10-CM

## 2022-02-27 NOTE — Patient Instructions (Signed)
I value your feedback and you entrusting us with your care. If you get a [facility] patient survey, I would appreciate you taking the time to let us know about your experience today. Thank you! ? ? ?

## 2022-03-02 LAB — CYTOLOGY - PAP
Adequacy: ABSENT
Diagnosis: NEGATIVE

## 2022-03-19 ENCOUNTER — Ambulatory Visit: Payer: Self-pay | Admitting: *Deleted

## 2022-03-19 ENCOUNTER — Other Ambulatory Visit: Payer: Self-pay | Admitting: Family Medicine

## 2022-03-19 MED ORDER — PANTOPRAZOLE SODIUM 40 MG PO TBEC: 40.0000 mg | DELAYED_RELEASE_TABLET | Freq: Two times a day (BID) | ORAL | 0 refills | Status: AC

## 2022-03-19 NOTE — Patient Instructions (Signed)
Visit Information  Thank you for taking time to visit with me today. Please don't hesitate to contact me if I can be of assistance to you.  Following are the goals we discussed today:  Listen for call from pharmacy for Protonix (Pantoprozole) refill.  Please call the Suicide and Crisis Lifeline: 988 call the [place] National Suicide Prevention Lifeline: (626)338-3931 or TTY: 647-784-7118 TTY 9044846378) to talk to a trained counselor call 1-800-273-TALK (toll free, 24 hour hotline) call 911 if you are experiencing a Mental Health or [place] or need someone to talk to.  Patient verbalizes understanding of instructions and care plan provided today and agrees to view in [facility]. Active MyChart status and patient understanding of how to access instructions and care plan via MyChart confirmed with patient.     The patient has been provided with contact information for the care management team and has been advised to call with any health related questions or concerns.   Valente David, RN, MSN, [facility] Care Management Care Management Coordinator [phone]

## 2022-03-19 NOTE — Patient Outreach (Signed)
  Care Coordination   Follow Up Visit Note   03/19/2022 Name: Sarah Marquez MRN: [id number] DOB: [birth year]  Sarah Marquez is a 66 y.o. year old female who sees Jerrol Banana., MD for primary care. I spoke with  Tobie Poet by phone today.  What matters to the patients health and wellness today?  Denies any ongoing issues with diarrhea, report blood pressure is good.  Denies any urgent concerns, encouraged to contact this care manager with questions.      Goals Addressed             This Visit's Progress    COMPLETED: RNCM: Effective Management of GI sx and sx   On track    Care Coordination Interventions:  BP Readings from Last 3 Encounters:  02/27/22 127/67  10/18/21 122/64  07/07/21 (!) 146/63    Evaluation of current treatment plan related to diarrhea and patient's adherence to plan as established by provider.  Advised patient to call the office for worsening sx and sx of diarrhea or other questions or concerns related to chronic conditions Reviewed medications with patient and discussed the patient states compliance with medications. She has prescription for Protonix '40mg'$  BID, in need of refill.  Direct message to PCP, prescription called in to pharmacy.  Patient updated. Provided patient with diarrhea and general GI health educational materials related to GI sx and sx.   Reviewed scheduled/upcoming provider appointments including 04-26-2022 at 1040 am with pcp. Also education provided that if she needs to be seen sooner to call the office for an appointment.  Discussed plans with patient for ongoing care management follow up and provided patient with direct contact information for care management team Advised patient to discuss changes in her GI health or other changes in chronic conditions  with provider            SDOH assessments and interventions completed:  No     Care Coordination Interventions Activated:  Yes  Care Coordination Interventions:  Yes,  provided   Follow up plan: No further intervention required.   Encounter Outcome:  Pt. Visit Completed   Valente David, RN, MSN, [place] Care Management Care Management Coordinator [phone]

## 2022-03-20 ENCOUNTER — Encounter: Payer: PPO | Admitting: *Deleted

## 2022-04-04 DIAGNOSIS — L309 Dermatitis, unspecified: Secondary | ICD-10-CM | POA: Diagnosis not present

## 2022-04-11 DIAGNOSIS — R059 Cough, unspecified: Secondary | ICD-10-CM | POA: Diagnosis not present

## 2022-04-26 ENCOUNTER — Other Ambulatory Visit: Payer: Self-pay | Admitting: Unknown Physician Specialty

## 2022-04-26 ENCOUNTER — Ambulatory Visit: Payer: PPO | Admitting: Family Medicine

## 2022-04-26 ENCOUNTER — Ambulatory Visit
Admission: RE | Admit: 2022-04-26 | Discharge: 2022-04-26 | Disposition: A | Discharge status: Home / Self Care | Payer: PPO | Source: Ambulatory Visit | Attending: Unknown Physician Specialty | Admitting: Unknown Physician Specialty

## 2022-04-26 DIAGNOSIS — R059 Cough, unspecified: Secondary | ICD-10-CM | POA: Diagnosis not present

## 2022-04-26 DIAGNOSIS — R070 Pain in throat: Secondary | ICD-10-CM | POA: Diagnosis not present

## 2022-05-03 ENCOUNTER — Ambulatory Visit
Admission: RE | Admit: 2022-05-03 | Discharge: 2022-05-03 | Disposition: A | Discharge status: Home / Self Care | Payer: PPO | Source: Ambulatory Visit | Attending: Unknown Physician Specialty | Admitting: Unknown Physician Specialty

## 2022-05-03 ENCOUNTER — Other Ambulatory Visit: Payer: Self-pay | Admitting: Unknown Physician Specialty

## 2022-05-03 DIAGNOSIS — R918 Other nonspecific abnormal finding of lung field: Secondary | ICD-10-CM | POA: Diagnosis not present

## 2022-05-03 DIAGNOSIS — J984 Other disorders of lung: Secondary | ICD-10-CM | POA: Diagnosis not present

## 2022-05-03 DIAGNOSIS — R059 Cough, unspecified: Secondary | ICD-10-CM

## 2022-05-03 DIAGNOSIS — I700 Atherosclerosis of aorta: Secondary | ICD-10-CM | POA: Diagnosis not present

## 2022-05-03 DIAGNOSIS — R590 Localized enlarged lymph nodes: Secondary | ICD-10-CM | POA: Diagnosis not present

## 2022-05-03 MED ORDER — IOPAMIDOL (ISOVUE-300) INJECTION 61%
75.0000 mL | Freq: Once | INTRAVENOUS | Status: AC | PRN
  Administered 2022-05-03: 75 mL via INTRAVENOUS

## 2022-05-04 DIAGNOSIS — C799 Secondary malignant neoplasm of unspecified site: Secondary | ICD-10-CM | POA: Diagnosis not present

## 2022-05-09 ENCOUNTER — Other Ambulatory Visit: Payer: Self-pay | Admitting: Pulmonary Disease

## 2022-05-09 DIAGNOSIS — C799 Secondary malignant neoplasm of unspecified site: Secondary | ICD-10-CM

## 2022-05-10 ENCOUNTER — Ambulatory Visit
Admission: RE | Admit: 2022-05-10 | Discharge: 2022-05-10 | Disposition: A | Discharge status: Home / Self Care | Payer: PPO | Source: Ambulatory Visit | Attending: Pulmonary Disease | Admitting: Pulmonary Disease

## 2022-05-10 DIAGNOSIS — C799 Secondary malignant neoplasm of unspecified site: Secondary | ICD-10-CM

## 2022-05-10 MED ORDER — GADOBUTROL 1 MMOL/ML IV SOLN
10.0000 mL | Freq: Once | INTRAVENOUS | Status: AC | PRN
  Administered 2022-05-10: 10 mL via INTRAVENOUS

## 2022-05-28 ENCOUNTER — Ambulatory Visit
Admission: RE | Admit: 2022-05-28 | Discharge: 2022-05-28 | Disposition: A | Discharge status: Home / Self Care | Payer: PPO | Source: Ambulatory Visit | Attending: Pulmonary Disease | Admitting: Pulmonary Disease

## 2022-05-28 DIAGNOSIS — R590 Localized enlarged lymph nodes: Secondary | ICD-10-CM | POA: Diagnosis not present

## 2022-05-28 DIAGNOSIS — R918 Other nonspecific abnormal finding of lung field: Secondary | ICD-10-CM | POA: Insufficient documentation

## 2022-05-28 DIAGNOSIS — I2510 Atherosclerotic heart disease of native coronary artery without angina pectoris: Secondary | ICD-10-CM | POA: Insufficient documentation

## 2022-05-28 DIAGNOSIS — R161 Splenomegaly, not elsewhere classified: Secondary | ICD-10-CM | POA: Diagnosis not present

## 2022-05-28 DIAGNOSIS — C799 Secondary malignant neoplasm of unspecified site: Secondary | ICD-10-CM | POA: Diagnosis not present

## 2022-05-28 DIAGNOSIS — I700 Atherosclerosis of aorta: Secondary | ICD-10-CM | POA: Diagnosis not present

## 2022-05-28 LAB — GLUCOSE, CAPILLARY: Glucose-Capillary: 93 mg/dL (ref 70–99)

## 2022-05-28 MED ORDER — FLUDEOXYGLUCOSE F - 18 (FDG) INJECTION
13.6100 | Freq: Once | INTRAVENOUS | Status: AC
  Administered 2022-05-28: 13.61 via INTRAVENOUS

## 2022-06-01 ENCOUNTER — Telehealth: Payer: Self-pay | Admitting: *Deleted

## 2022-06-01 NOTE — Telephone Encounter (Signed)
Nurse placed call to patient to review appointment details for upcoming new oncology consultation visit. Nurse confirmed appointment time and details, verified that is aware of where to come for appointment. All questions answered.

## 2022-06-04 ENCOUNTER — Inpatient Hospital Stay: Payer: PPO

## 2022-06-04 ENCOUNTER — Encounter: Payer: Self-pay | Admitting: *Deleted

## 2022-06-04 ENCOUNTER — Other Ambulatory Visit: Payer: Self-pay | Admitting: Pulmonary Disease

## 2022-06-04 ENCOUNTER — Inpatient Hospital Stay: Payer: PPO | Attending: Oncology | Admitting: Oncology

## 2022-06-04 ENCOUNTER — Encounter: Payer: Self-pay | Admitting: Oncology

## 2022-06-04 VITALS — BP 106/68 | HR 72 | Temp 99.1°F | Resp 18 | Ht 65.0 in | Wt 236.0 lb

## 2022-06-04 DIAGNOSIS — R911 Solitary pulmonary nodule: Secondary | ICD-10-CM

## 2022-06-04 DIAGNOSIS — Z803 Family history of malignant neoplasm of breast: Secondary | ICD-10-CM | POA: Insufficient documentation

## 2022-06-04 DIAGNOSIS — C799 Secondary malignant neoplasm of unspecified site: Secondary | ICD-10-CM

## 2022-06-04 DIAGNOSIS — R918 Other nonspecific abnormal finding of lung field: Secondary | ICD-10-CM | POA: Diagnosis not present

## 2022-06-04 LAB — COMPREHENSIVE METABOLIC PANEL
ALT: 9 U/L (ref 0–44)
AST: 20 U/L (ref 15–41)
Albumin: 3.1 g/dL — ABNORMAL LOW (ref 3.5–5.0)
Alkaline Phosphatase: 84 U/L (ref 38–126)
Anion gap: 8 (ref 5–15)
BUN: 16 mg/dL (ref 8–23)
CO2: 25 mmol/L (ref 22–32)
Calcium: 8.8 mg/dL — ABNORMAL LOW (ref 8.9–10.3)
Chloride: 103 mmol/L (ref 98–111)
Creatinine, Ser: 1.1 mg/dL — ABNORMAL HIGH (ref 0.44–1.00)
GFR, Estimated: 55 mL/min — ABNORMAL LOW (ref >60)
Glucose, Bld: 95 mg/dL (ref 70–99)
Potassium: 4 mmol/L (ref 3.5–5.1)
Sodium: 136 mmol/L (ref 135–145)
Total Bilirubin: 1.1 mg/dL (ref 0.3–1.2)
Total Protein: 6.9 g/dL (ref 6.5–8.1)

## 2022-06-04 LAB — CBC WITH DIFFERENTIAL/PLATELET
Abs Immature Granulocytes: 0.02 10*3/uL (ref 0.00–0.07)
Basophils Absolute: 0 10*3/uL (ref 0.0–0.1)
Basophils Relative: 0 %
Eosinophils Absolute: 0.6 10*3/uL — ABNORMAL HIGH (ref 0.0–0.5)
Eosinophils Relative: 9 %
HCT: 38.4 % (ref 36.0–46.0)
Hemoglobin: 12.4 g/dL (ref 12.0–15.0)
Immature Granulocytes: 0 %
Lymphocytes Relative: 17 %
Lymphs Abs: 1.2 10*3/uL (ref 0.7–4.0)
MCH: 25.8 pg — ABNORMAL LOW (ref 26.0–34.0)
MCHC: 32.3 g/dL (ref 30.0–36.0)
MCV: 80 fL (ref 80.0–100.0)
Monocytes Absolute: 0.5 10*3/uL (ref 0.1–1.0)
Monocytes Relative: 8 %
Neutro Abs: 4.5 10*3/uL (ref 1.7–7.7)
Neutrophils Relative %: 66 %
Platelets: 243 10*3/uL (ref 150–400)
RBC: 4.8 MIL/uL (ref 3.87–5.11)
RDW: 16.5 % — ABNORMAL HIGH (ref 11.5–15.5)
WBC: 6.8 10*3/uL (ref 4.0–10.5)
nRBC: 0 % (ref 0.0–0.2)

## 2022-06-04 NOTE — Progress Notes (Signed)
Hematology/Oncology Consult note [facility] Telephone:([phone] [phone]  Patient Care Team: Eulas Post, MD as PCP - General (Family Medicine) Eulas Post, MD (Family Medicine) Valente David, RN as [facility] [place], [place], [place] as Oncology Nurse Navigator   Name of the patient: Sarah Marquez  [id number]  [birth year]    Reason for referral-bilateral lung nodules   Referring physician-Dr. Lanney Gins  Date of visit: 06/04/22   History of presenting illness- Patient is a 67 year old female with a remote history of smoking in her teenage years.  She was seen by ENT for symptoms of persistent cough.  This was followed by a chest x-ray in December 2023 which showed patchy opacities in the bilateral lungs concerning for multifocal pneumonia.  This was followed by a CT chest with contrast in January 2024 which showed numerous bilateral lung nodules with the largest 1 in the left upper lobe measuring 2.5 x 1.9 cm.  No pleural effusion or pneumothorax.  Enlarged mediastinal and hilar lymph nodes measuring up to 1.3 cm.  Findings were concerning for lung cancer and a PET CT scan was recommended.  Patient was seen by pulmonary Dr.Aleskerov and underwent a PET CT scan on 05/28/2022 which showed multiple lung nodules too numerous to count ranging from 1 cm to 2.7 cm which were hypermetabolic on the PET scan.  Tiny left supraclavicular lymph node measuring 0.6 cm with an SUV of 2.18.  Left hilar adenopathy as well as right paratracheal adenopathy which could be nonspecific versus metastatic.  PET CT scan findings consistent with cirrhosis and splenomegaly.  Tracer uptake in the tail of the pancreas of indeterminate etiology.  Patient currently reports her cough is getting better with cough medications.  Denies any significant pain.  No family history of lung, pancreatic or colon cancer.  History of breast cancer in her  mother.  ECOG PS- 1  Pain scale- 0   Review of systems- Review of Systems  Constitutional:  Negative for chills, fever, malaise/fatigue and weight loss.  HENT:  Negative for congestion, ear discharge and nosebleeds.   Eyes:  Negative for blurred vision.  Respiratory:  Positive for cough. Negative for hemoptysis, sputum production, shortness of breath and wheezing.   Cardiovascular:  Negative for chest pain, palpitations, orthopnea and claudication.  Gastrointestinal:  Negative for abdominal pain, blood in stool, constipation, diarrhea, heartburn, melena, nausea and vomiting.  Genitourinary:  Negative for dysuria, flank pain, frequency, hematuria and urgency.  Musculoskeletal:  Negative for back pain, joint pain and myalgias.  Skin:  Negative for rash.  Neurological:  Negative for dizziness, tingling, focal weakness, seizures, weakness and headaches.  Endo/Heme/Allergies:  Does not bruise/bleed easily.  Psychiatric/Behavioral:  Negative for depression and suicidal ideas. The patient does not have insomnia.     Allergies  Allergen Reactions   Albuterol     Nebulizer form, causes wheezing   Beclomethasone Swelling    Beconase Nose spray swelled face up Currently uses Qvar on occasion and it does not cause problems   Sulfa Antibiotics Hives   Latex Rash    Adhesives, bandaids Paper tape is okay   Meloxicam Palpitations    Patient Active Problem List   Diagnosis Date Noted   S/P TKR (total knee replacement) using cement, left 07/06/2021   S/P TKR (total knee replacement) using cement, right 10/25/2020   Right hip pain 02/26/2020   Calcification of right carotid artery- on panoramic x ray 09/2019 02/26/2020   Urinary tract  infection without hematuria 02/18/2020   Cough variant asthma 12/27/2015   Dyspnea 11/14/2015   Recurrent URI (upper respiratory infection) 11/10/2015   Cough 11/10/2015   Benign neoplasm of colon 01/19/2015   Allergic rhinitis 01/19/2015   Absolute anemia  01/19/2015   Colon polyp 01/19/2015   Ankle bruise 29/51/8841   Dysmetabolic syndrome X 66/09/3014   Disorder of esophagus 01/19/2015   Esophagitis, reflux 01/19/2015   Frontal sinusitis 01/19/2015   BP (high blood pressure) 01/19/2015   Anemia, iron deficiency 01/19/2015   Iron deficiency anemia 01/19/2015   Abnormal kidney function 01/19/2015   Cervical pain 01/19/2015   Dyspnea, paroxysmal nocturnal 01/19/2015   Gastric ulcer 01/19/2015   Hive 01/19/2015   B12 deficiency 01/19/2015   Vitamin D deficiency 01/19/2015   Degenerative arthritis of finger 06/07/2014   Benign paroxysmal positional nystagmus 03/09/2009   HLD (hyperlipidemia) 11/03/2008   Fam hx-ischem heart disease 06/26/2008   History of tobacco use 06/26/2008   Essential (primary) hypertension 06/03/2008   Extreme obesity 06/03/2008   Apnea, sleep 06/03/2008   Morbid obesity ([place]) 06/03/2008   H/O total hysterectomy 04/30/1998     Past Medical History:  Diagnosis Date   Arthritis    neck, knees   Asthma    Cancer of the skin, basal cell    a.) s/p excision from neck, back, and foot   Carpal tunnel syndrome    Colon polyps    Difficult intubation    Gastric ulcer    GERD (gastroesophageal reflux disease)    Gout    Hyperlipidemia    Hypertension    Iron deficiency anemia    OSA (obstructive sleep apnea)    a.) does not require nocturnal PAP therapy   Vertigo    last episode 12/2018   Wears dentures    partial upper     Past Surgical History:  Procedure Laterality Date   ABDOMINAL HYSTERECTOMY     APPENDECTOMY     BACK SURGERY     x2 FOR RUPTURED DISC   CARDIAC CATHETERIZATION  1990's   CATARACT EXTRACTION W/PHACO Left 04/07/2019   Procedure: CATARACT EXTRACTION PHACO AND INTRAOCULAR LENS PLACEMENT (IOC) LEFT 4.43, 00:34.1;  Surgeon: Birder Robson, MD;  Location: [facility];  Service: Ophthalmology;  Laterality: Left;  Sleep Apnea-does not wear CPAP Latex-adhesives   CATARACT  EXTRACTION W/PHACO Right 05/05/2019   Procedure: CATARACT EXTRACTION PHACO AND INTRAOCULAR LENS PLACEMENT (IOC) RIGHT;  4.83, 00:33.4;  Surgeon: Birder Robson, MD;  Location: [facility];  Service: Ophthalmology;  Laterality: Right;  SLEP APNEA-CPAP   CHOLECYSTECTOMY     COLONOSCOPY     COLONOSCOPY WITH PROPOFOL N/A 10/16/2017   Procedure: COLONOSCOPY WITH PROPOFOL;  Surgeon: Manya Silvas, MD;  Location: [facility] ENDOSCOPY;  Service: Endoscopy;  Laterality: N/A;   ESOPHAGOGASTRODUODENOSCOPY     ESOPHAGOGASTRODUODENOSCOPY (EGD) WITH PROPOFOL N/A 10/16/2017   Procedure: ESOPHAGOGASTRODUODENOSCOPY (EGD) WITH PROPOFOL;  Surgeon: Manya Silvas, MD;  Location: [facility] ENDOSCOPY;  Service: Endoscopy;  Laterality: N/A;   FUNCTIONAL ENDOSCOPIC SINUS SURGERY     knee arthoscopy     LUMBAR DISC SURGERY     ruptured disc x 2   TONSILLECTOMY AND ADENOIDECTOMY     TOTAL KNEE ARTHROPLASTY Right 10/25/2020   Procedure: TOTAL KNEE ARTHROPLASTY - Rachelle Hora to assist;  Surgeon: Hessie Knows, MD;  Location: [facility];  Service: Orthopedics;  Laterality: Right;   TOTAL KNEE ARTHROPLASTY Left 07/06/2021   Procedure: TOTAL KNEE ARTHROPLASTY;  Surgeon: Hessie Knows, MD;  Location: [facility];  Service: Orthopedics;  Laterality: Left;    Social History   Socioeconomic History   Marital status: Married    Spouse name: Elberta Fortis   Number of children: Not on file   Years of education: Not on file   Highest education level: Not on file  Occupational History   Occupation: variety of office jobs    Comment: retired  Tobacco Use   Smoking status: Never   Smokeless tobacco: Never   Tobacco comments:    former social smoker  Scientific laboratory technician Use: Never used  Substance and Sexual Activity   Alcohol use: No    Alcohol/week: 0.0 standard drinks of alcohol   Drug use: No   Sexual activity: Yes    Birth control/protection: Post-menopausal  Other Topics Concern   Not on file  Social History Narrative    Patient lives with husband and an older son.   Patient feels safe in home. Son has Muscular dystrophy and they have a ramp.   Social Determinants of Health   Financial Resource Strain: Not on file  Food Insecurity: No Food Insecurity (06/04/2022)   Hunger Vital Sign    Worried About Running Out of Food in the Last Year: Never true    Ran Out of Food in the Last Year: Never true  Transportation Needs: No Transportation Needs (06/04/2022)   PRAPARE - Hydrologist (Medical): No    Lack of Transportation (Non-Medical): No  Physical Activity: Inactive (12/20/2021)   Exercise Vital Sign    Days of Exercise per Week: 0 days    Minutes of Exercise per Session: 0 min  Stress: No Stress Concern Present (12/20/2021)   [place]    Feeling of Stress : Not at all  Social Connections: [place] (12/20/2021)   Social Connection and Isolation Panel [NHANES]    Frequency of Communication with Friends and Family: More than three times a week    Frequency of Social Gatherings with Friends and Family: More than three times a week    Attends Religious Services: More than 4 times per year    Active Member of [employer] or Organizations: Yes    Attends Music therapist: More than 4 times per year    Marital Status: Married  Human resources officer Violence: Not At Risk (06/04/2022)   Humiliation, Afraid, Rape, and Kick questionnaire    Fear of Current or Ex-Partner: No    Emotionally Abused: No    Physically Abused: No    Sexually Abused: No     Family History  Problem Relation Age of Onset   Breast cancer Mother 60   Heart attack Father    Heart attack Brother      Current Outpatient Medications:    beclomethasone (QVAR) 80 MCG/ACT inhaler, Inhale 2 puffs into the lungs 2 (two) times daily as needed (shortness of breath)., Disp: , Rfl:    etodolac (LODINE) 400 MG tablet, Take 400 mg by mouth 2  (two) times daily., Disp: , Rfl:    losartan (COZAAR) 50 MG tablet, Take 1 tablet (50 mg total) by mouth daily., Disp: 90 tablet, Rfl: 3   methocarbamol (ROBAXIN) 500 MG tablet, Take by mouth., Disp: , Rfl:    montelukast (SINGULAIR) 10 MG tablet, TAKE 1 TABLET (10 MG TOTAL) BY MOUTH DAILY., Disp: 30 tablet, Rfl: 2   Multiple Vitamins-Minerals (PRESERVISION AREDS 2 PO), Take 1 tablet by  mouth in the morning and at bedtime., Disp: , Rfl:    pantoprazole (PROTONIX) 40 MG tablet, Take 1 tablet (40 mg total) by mouth 2 (two) times daily., Disp: 180 tablet, Rfl: 0   triamcinolone ointment (KENALOG) 0.1 %, Apply topically 2 (two) times daily as needed., Disp: , Rfl:    verapamil (CALAN-SR) 180 MG CR tablet, Take 1 tablet (180 mg total) by mouth daily., Disp: 90 tablet, Rfl: 3   Vitamin D, Ergocalciferol, (DRISDOL) 1.25 MG (50000 UNIT) CAPS capsule, TAKE 1 CAPSULE (50,000 UNITS TOTAL) BY MOUTH EVERY 7 (SEVEN) DAYS. (TAKING ONE TABLET PER WEEK), Disp: 4 capsule, Rfl: 5   Physical exam:  Vitals:   06/04/22 1338  BP: 106/68  Pulse: 72  Resp: 18  Temp: 99.1 F (37.3 C)  TempSrc: Tympanic  SpO2: 97%  Weight: 236 lb (107 kg)  Height: '5\' 5"'$  (1.651 m)   Physical Exam Cardiovascular:     Rate and Rhythm: Normal rate and regular rhythm.     Heart sounds: Normal heart sounds.  Pulmonary:     Effort: Pulmonary effort is normal.     Breath sounds: Normal breath sounds.  Abdominal:     General: Bowel sounds are normal.     Palpations: Abdomen is soft.  Lymphadenopathy:     Comments: No palpable cervical or supraclavicular adenopathy.  Skin:    General: Skin is warm and dry.  Neurological:     Mental Status: She is alert and oriented to person, place, and time.           Latest Ref Rng & Units 06/04/2022    2:36 PM  CMP  Glucose 70 - 99 mg/dL 95   BUN 8 - 23 mg/dL 16   Creatinine 0.44 - 1.00 mg/dL 1.10   Sodium 135 - 145 mmol/L 136   Potassium 3.5 - 5.1 mmol/L 4.0   Chloride 98 - 111  mmol/L 103   CO2 22 - 32 mmol/L 25   Calcium 8.9 - 10.3 mg/dL 8.8   Total Protein 6.5 - 8.1 g/dL 6.9   Total Bilirubin 0.3 - 1.2 mg/dL 1.1   Alkaline Phos 38 - 126 U/L 84   AST 15 - 41 U/L 20   ALT 0 - 44 U/L 9       Latest Ref Rng & Units 06/04/2022    2:36 PM  CBC  WBC 4.0 - 10.5 K/uL 6.8   Hemoglobin 12.0 - 15.0 g/dL 12.4   Hematocrit 36.0 - 46.0 % 38.4   Platelets 150 - 400 K/uL 243     No images are attached to the encounter.  NM PET Image Initial (PI) Skull Base To Thigh (F-18 FDG)  Result Date: 05/29/2022 CLINICAL DATA:  Initial treatment strategy for metastatic malignant neoplasm of unspecified site. EXAM: NUCLEAR MEDICINE PET SKULL BASE TO THIGH TECHNIQUE: 13.61 mCi F-18 FDG was injected intravenously. Full-ring PET imaging was performed from the skull base to thigh after the radiotracer. CT data was obtained and used for attenuation correction and anatomic localization. Fasting blood glucose: 93 mg/dl COMPARISON:  CT chest 05/03/2022 FINDINGS: Mediastinal blood pool activity: SUV max 1.56 Liver activity: SUV max NA NECK: No hypermetabolic lymph nodes in the neck. Incidental CT findings: None CHEST: There are innumerable tracer avid nodules scattered throughout both lungs. Nodules are too numerous to count. Reference lesions include: -left upper lobe lung nodule measures 2.5 cm with SUV max of 17.22, image 80/2 -left lower lobe lung nodule measures 1.8 cm  with SUV max of 7.84, image 101/2 -right lower lobe lung nodule measures 1.5 cm with SUV max of 4.64, image 108/2. -right middle lobe lung nodule measures 2.7 cm with SUV max of 10.21, image 106/2. -right upper lobe lung nodule measures 2.1 cm with SUV max of 14.81, image 84/2. Enlarged left hilar lymph node measures 2.5 cm within SUV max of 7.32, image 93/2. Right paratracheal lymph node measures 1.4 cm within SUV max of 3.04, image 87/2. Azygoesophageal lymph node measures 1.4 cm with SUV max of 3.05, image 99/2. Tiny left  supraclavicular lymph node measures 0.6 cm with SUV max of 2.18, image 67/2. Incidental CT findings: Mild aortic atherosclerosis and multi vessel coronary artery calcifications. ABDOMEN/PELVIS: No abnormal tracer activity identified within the liver, in the region of the distal pancreas within the splenic hilum there is a focal area of increased uptake within SUV max of 4.01, image 134/2. No abnormal tracer uptake within the liver, spleen, adrenal glands. No tracer avid abdominopelvic adenopathy. Incidental CT findings: Morphologic features of the liver compatible with cirrhosis. The spleen is enlarged measuring 14.7 cm in cranial caudal dimension. Aortic atherosclerosis. Cholecystectomy. SKELETON: No focal hypermetabolic activity to suggest skeletal metastasis. Incidental CT findings: None IMPRESSION: 1. There are innumerable tracer avid pulmonary nodules scattered throughout both lungs. Nodules are too numerous to count. Imaging findings are compatible with metastatic disease. 2. Enlarged left hilar lymph node with increased tracer uptake compatible with nodal metastasis. 3. Mildly enlarged right paratracheal and left supraclavicular lymph nodes exhibit mild increased tracer uptake and are nonspecific in the setting of metastatic disease. 4. Small focus of increased tracer uptake is identified in the region of the tail of pancreas within the splenic hilum. Etiology indeterminate. Cannot exclude tumor within distal tail of pancreas. Consider more definitive characterization with contrast enhanced MRI of the pancreas. 5. Morphologic features of the liver compatible with cirrhosis. Splenomegaly. 6. Multi vessel coronary artery calcifications. 7.  Aortic Atherosclerosis (ICD10-I70.0). Electronically Signed   By: Kerby Moors M.D.   On: 05/29/2022 11:32   MR BRAIN W WO CONTRAST  Result Date: 05/10/2022 CLINICAL DATA:  Metastatic malignant neoplasm, unspecified site ([place]) C79.9 (ICD-10-CM). EXAM: MRI HEAD WITHOUT  AND WITH CONTRAST TECHNIQUE: Multiplanar, multiecho pulse sequences of the brain and surrounding structures were obtained without and with intravenous contrast. CONTRAST:  13m GADAVIST GADOBUTROL 1 MMOL/ML IV SOLN COMPARISON:  None Available. FINDINGS: Brain: No acute infarction, hemorrhage, hydrocephalus, extra-axial collection or mass lesion. No focus of abnormal contrast enhancement identified. Vascular: Normal flow voids. Skull and upper cervical spine: Normal marrow signal. Sinuses/Orbits: Negative. Other: None. IMPRESSION: No evidence of intracranial metastatic disease. Unremarkable MRI of the brain. Electronically Signed   By: KPedro EarlsM.D.   On: 05/10/2022 14:25    Assessment and plan- Patient is a 67y.o. female referred for bilateral hypermetabolic lung nodules concerning for malignancy  I have reviewed PET CT scan images independently and discussed findings with the patient and I have also shown her the images of PET scan.  Patient has innumerable bilateral lung nodules which are hypermetabolic on PET scan concerning for metastatic disease.  There was also uptake seen in the tail of the pancreas which could be nonspecific versus related to malignancy.  At this time I would recommend biopsy of the lung nodule to a certain etiology.  Patient has seen Dr.Aleskerov who plans to do bronchoscopy on 06/20/2022.  I will see her on 06/25/2022 to discuss further management.  I  will obtain baseline labs CBC with differential CMP CEA and CA 19-9 today.  Further management to be based on biopsy findings.   Thank you for this kind referral and the opportunity to participate in the care of this patient   Visit Diagnosis 1. Lung mass     Dr. Randa Evens, MD, MPH [facility] [phone] 06/04/2022

## 2022-06-04 NOTE — Progress Notes (Signed)
Met with patient during initial consult with Dr. Janese Banks. All questions answered during visit. Reviewed upcoming appts. Contact info given and instructed to call with any questions or needs. Pt verbalized understanding. Nothing further needed at this time.

## 2022-06-04 NOTE — Progress Notes (Signed)
Patient does not have any concerns for the provider.

## 2022-06-05 LAB — CANCER ANTIGEN 19-9: CA 19-9: 9 U/mL (ref 0–35)

## 2022-06-05 LAB — CEA: CEA: 2.1 ng/mL (ref 0.0–4.7)

## 2022-06-08 DIAGNOSIS — G4733 Obstructive sleep apnea (adult) (pediatric): Secondary | ICD-10-CM | POA: Diagnosis not present

## 2022-06-08 DIAGNOSIS — Z6841 Body Mass Index (BMI) 40.0 and over, adult: Secondary | ICD-10-CM | POA: Diagnosis not present

## 2022-06-08 DIAGNOSIS — I10 Essential (primary) hypertension: Secondary | ICD-10-CM | POA: Diagnosis not present

## 2022-06-08 DIAGNOSIS — E7800 Pure hypercholesterolemia, unspecified: Secondary | ICD-10-CM | POA: Diagnosis not present

## 2022-06-08 DIAGNOSIS — R9389 Abnormal findings on diagnostic imaging of other specified body structures: Secondary | ICD-10-CM | POA: Diagnosis not present

## 2022-06-08 DIAGNOSIS — Z96651 Presence of right artificial knee joint: Secondary | ICD-10-CM | POA: Diagnosis not present

## 2022-06-11 ENCOUNTER — Encounter
Admission: RE | Admit: 2022-06-11 | Discharge: 2022-06-11 | Disposition: A | Discharge status: Home / Self Care | Payer: PPO | Source: Ambulatory Visit | Attending: Pulmonary Disease | Admitting: Pulmonary Disease

## 2022-06-11 ENCOUNTER — Other Ambulatory Visit: Payer: Self-pay | Admitting: Pulmonary Disease

## 2022-06-11 ENCOUNTER — Other Ambulatory Visit: Payer: Self-pay

## 2022-06-11 DIAGNOSIS — R911 Solitary pulmonary nodule: Secondary | ICD-10-CM

## 2022-06-11 HISTORY — DX: Dyspnea, unspecified: R06.00

## 2022-06-11 HISTORY — DX: Solitary pulmonary nodule: R91.1

## 2022-06-11 HISTORY — DX: Occlusion and stenosis of right carotid artery: I65.21

## 2022-06-11 HISTORY — DX: Iron deficiency: E61.1

## 2022-06-11 NOTE — Patient Instructions (Addendum)
Your procedure is scheduled on: 06/20/2022  Report to the Registration Desk on the 1st floor of the [facility]. To find out your arrival time, please call [phone] between 1PM - 3PM on: 06/19/2022  If your arrival time is 6:00 am, do not arrive before that time as the [facility] entrance doors do not open until 6:00 am.  REMEMBER: Instructions that are not followed completely may result in serious medical risk, up to and including death; or upon the discretion of your surgeon and anesthesiologist your surgery may need to be rescheduled.  Do not eat food after midnight the night before surgery.  No gum chewing or hard candies.  One week prior to surgery: Stop Anti-inflammatories (NSAIDS) such as Advil, Aleve, Ibuprofen, Motrin, Naproxen, Naprosyn and Aspirin based products such as Excedrin, Goody's Powder, BC Powder. Stop ANY OVER THE COUNTER supplements until after surgery. You may however, continue to take Tylenol if needed for pain up until the day of surgery.  Continue taking all prescribed medications with the exception of the following:   Follow recommendations from Cardiologist or PCP regarding stopping blood thinners.  TAKE ONLY THESE MEDICATIONS THE MORNING OF SURGERY WITH A SIP OF WATER:  etodolac (LODINE)  pantoprazole (PROTONIX) Antacid (take one the night before and one on the morning of surgery - helps to prevent nausea after surgery.)  Use inhalers on the day of surgery as prescribed.    No Alcohol for 24 hours before or after surgery.  No Smoking including e-cigarettes for 24 hours before surgery.  No chewable tobacco products for at least 6 hours before surgery.  No nicotine patches on the day of surgery.  Do not use any "recreational" drugs for at least a week (preferably 2 weeks) before your surgery.  Please be advised that the combination of cocaine and anesthesia may have negative outcomes, up to and including death. If you test positive for cocaine,  your surgery will be cancelled.  On the morning of surgery brush your teeth with toothpaste and water, you may rinse your mouth with mouthwash if you wish. Do not swallow any toothpaste or mouthwash.  You may shower.   Do not wear jewelry, make-up, hairpins, clips or nail polish.  Do not wear lotions, powders, or perfumes.   Do not shave body hair from the neck down 48 hours before surgery.  Contact lenses, hearing aids and dentures may not be worn into surgery.  Do not bring valuables to the hospital. [facility] is not responsible for any missing/lost belongings or valuables.   Notify your doctor if there is any change in your medical condition (cold, fever, infection).  Wear comfortable clothing (specific to your surgery type) to the hospital.  After surgery, you can help prevent lung complications by doing breathing exercises.  Take deep breaths and cough every 1-2 hours. Your doctor may order a device called an Incentive Spirometer to help you take deep breaths.    If you are being discharged the day of surgery, you will not be allowed to drive home. You will need a responsible individual to drive you home and stay with you for 24 hours after surgery.    Please call the [facility] Dept. at [phone] if you have any questions about these instructions.  Surgery Visitation Policy:  Patients undergoing a surgery or procedure may have two family members or support persons with them as long as the person is not COVID-19 positive or experiencing its symptoms.

## 2022-06-18 ENCOUNTER — Encounter: Payer: Self-pay | Admitting: Urgent Care

## 2022-06-18 ENCOUNTER — Encounter
Admission: RE | Admit: 2022-06-18 | Discharge: 2022-06-18 | Disposition: A | Discharge status: Home / Self Care | Payer: PPO | Source: Ambulatory Visit | Attending: Pulmonary Disease | Admitting: Pulmonary Disease

## 2022-06-18 ENCOUNTER — Inpatient Hospital Stay
Admission: RE | Admit: 2022-06-18 | Discharge: 2022-06-18 | Disposition: A | Discharge status: Home / Self Care | Payer: PPO | Source: Ambulatory Visit

## 2022-06-18 DIAGNOSIS — R9431 Abnormal electrocardiogram [ECG] [EKG]: Secondary | ICD-10-CM | POA: Insufficient documentation

## 2022-06-18 DIAGNOSIS — R911 Solitary pulmonary nodule: Secondary | ICD-10-CM

## 2022-06-18 DIAGNOSIS — Z1152 Encounter for screening for COVID-19: Secondary | ICD-10-CM | POA: Diagnosis not present

## 2022-06-18 DIAGNOSIS — Z20822 Contact with and (suspected) exposure to covid-19: Secondary | ICD-10-CM

## 2022-06-18 DIAGNOSIS — Z01818 Encounter for other preprocedural examination: Secondary | ICD-10-CM | POA: Diagnosis not present

## 2022-06-18 LAB — PROTIME-INR
INR: 1.1 (ref 0.8–1.2)
Prothrombin Time: 14.3 seconds (ref 11.4–15.2)

## 2022-06-18 LAB — CBC
HCT: 36.3 % (ref 36.0–46.0)
Hemoglobin: 11.4 g/dL — ABNORMAL LOW (ref 12.0–15.0)
MCH: 25.6 pg — ABNORMAL LOW (ref 26.0–34.0)
MCHC: 31.4 g/dL (ref 30.0–36.0)
MCV: 81.4 fL (ref 80.0–100.0)
Platelets: 179 10*3/uL (ref 150–400)
RBC: 4.46 MIL/uL (ref 3.87–5.11)
RDW: 16.5 % — ABNORMAL HIGH (ref 11.5–15.5)
WBC: 4.8 10*3/uL (ref 4.0–10.5)
nRBC: 0 % (ref 0.0–0.2)

## 2022-06-18 LAB — APTT: aPTT: 35 seconds (ref 24–36)

## 2022-06-19 ENCOUNTER — Encounter: Payer: Self-pay | Admitting: Urgent Care

## 2022-06-19 ENCOUNTER — Ambulatory Visit
Admission: RE | Admit: 2022-06-19 | Discharge: 2022-06-19 | Disposition: A | Discharge status: Home / Self Care | Payer: PPO | Source: Ambulatory Visit | Attending: Pulmonary Disease | Admitting: Pulmonary Disease

## 2022-06-19 DIAGNOSIS — R911 Solitary pulmonary nodule: Secondary | ICD-10-CM | POA: Insufficient documentation

## 2022-06-19 DIAGNOSIS — C799 Secondary malignant neoplasm of unspecified site: Secondary | ICD-10-CM | POA: Diagnosis not present

## 2022-06-19 DIAGNOSIS — R918 Other nonspecific abnormal finding of lung field: Secondary | ICD-10-CM | POA: Diagnosis not present

## 2022-06-19 LAB — SARS CORONAVIRUS 2 (TAT 6-24 HRS): SARS Coronavirus 2: NEGATIVE

## 2022-06-20 ENCOUNTER — Encounter
Admission: RE | Disposition: A | Discharge status: Home / Self Care | Payer: Self-pay | Source: Home / Self Care | Attending: Pulmonary Disease

## 2022-06-20 ENCOUNTER — Ambulatory Visit: Payer: PPO

## 2022-06-20 ENCOUNTER — Ambulatory Visit: Payer: PPO | Admitting: Registered Nurse

## 2022-06-20 ENCOUNTER — Other Ambulatory Visit: Payer: Self-pay

## 2022-06-20 ENCOUNTER — Ambulatory Visit
Admission: RE | Admit: 2022-06-20 | Discharge: 2022-06-20 | Disposition: A | Discharge status: Home / Self Care | Payer: PPO | Attending: Pulmonary Disease | Admitting: Pulmonary Disease

## 2022-06-20 ENCOUNTER — Encounter: Payer: Self-pay | Admitting: *Deleted

## 2022-06-20 DIAGNOSIS — D381 Neoplasm of uncertain behavior of trachea, bronchus and lung: Secondary | ICD-10-CM | POA: Diagnosis not present

## 2022-06-20 DIAGNOSIS — R911 Solitary pulmonary nodule: Secondary | ICD-10-CM | POA: Diagnosis not present

## 2022-06-20 DIAGNOSIS — J45909 Unspecified asthma, uncomplicated: Secondary | ICD-10-CM | POA: Diagnosis not present

## 2022-06-20 DIAGNOSIS — Z85828 Personal history of other malignant neoplasm of skin: Secondary | ICD-10-CM | POA: Diagnosis not present

## 2022-06-20 DIAGNOSIS — E785 Hyperlipidemia, unspecified: Secondary | ICD-10-CM | POA: Diagnosis not present

## 2022-06-20 DIAGNOSIS — I10 Essential (primary) hypertension: Secondary | ICD-10-CM | POA: Diagnosis not present

## 2022-06-20 DIAGNOSIS — Z9049 Acquired absence of other specified parts of digestive tract: Secondary | ICD-10-CM | POA: Diagnosis not present

## 2022-06-20 DIAGNOSIS — R590 Localized enlarged lymph nodes: Secondary | ICD-10-CM | POA: Insufficient documentation

## 2022-06-20 DIAGNOSIS — R918 Other nonspecific abnormal finding of lung field: Secondary | ICD-10-CM | POA: Insufficient documentation

## 2022-06-20 DIAGNOSIS — Z87891 Personal history of nicotine dependence: Secondary | ICD-10-CM | POA: Diagnosis not present

## 2022-06-20 DIAGNOSIS — Z96653 Presence of artificial knee joint, bilateral: Secondary | ICD-10-CM | POA: Insufficient documentation

## 2022-06-20 DIAGNOSIS — K219 Gastro-esophageal reflux disease without esophagitis: Secondary | ICD-10-CM | POA: Diagnosis not present

## 2022-06-20 DIAGNOSIS — G4733 Obstructive sleep apnea (adult) (pediatric): Secondary | ICD-10-CM | POA: Insufficient documentation

## 2022-06-20 DIAGNOSIS — Z90710 Acquired absence of both cervix and uterus: Secondary | ICD-10-CM | POA: Diagnosis not present

## 2022-06-20 HISTORY — PX: VIDEO BRONCHOSCOPY WITH ENDOBRONCHIAL ULTRASOUND: SHX6177

## 2022-06-20 SURGERY — BRONCHOSCOPY, WITH EBUS
Anesthesia: General

## 2022-06-20 MED ORDER — MIDAZOLAM HCL 2 MG/2ML IJ SOLN
INTRAMUSCULAR | Status: DC | PRN
  Administered 2022-06-20: 2 mg via INTRAVENOUS

## 2022-06-20 MED ORDER — PHENYLEPHRINE HCL-NACL 20-0.9 MG/250ML-% IV SOLN
INTRAVENOUS | Status: AC
  Filled 2022-06-20: qty 250

## 2022-06-20 MED ORDER — PROPOFOL 1000 MG/100ML IV EMUL
INTRAVENOUS | Status: AC
  Filled 2022-06-20: qty 100

## 2022-06-20 MED ORDER — GLYCOPYRROLATE 0.2 MG/ML IJ SOLN
INTRAMUSCULAR | Status: DC | PRN
  Administered 2022-06-20: .2 mg via INTRAVENOUS

## 2022-06-20 MED ORDER — PHENYLEPHRINE HCL-NACL 20-0.9 MG/250ML-% IV SOLN
INTRAVENOUS | Status: DC | PRN
  Administered 2022-06-20: 25 ug/min via INTRAVENOUS

## 2022-06-20 MED ORDER — PROPOFOL 500 MG/50ML IV EMUL
INTRAVENOUS | Status: DC | PRN
  Administered 2022-06-20: 175 ug/kg/min via INTRAVENOUS

## 2022-06-20 MED ORDER — KETAMINE HCL 10 MG/ML IJ SOLN
INTRAMUSCULAR | Status: DC | PRN
  Administered 2022-06-20 (×2): 20 mg via INTRAVENOUS
  Administered 2022-06-20: 10 mg via INTRAVENOUS

## 2022-06-20 MED ORDER — PHENYLEPHRINE 80 MCG/ML (10ML) SYRINGE FOR IV PUSH (FOR BLOOD PRESSURE SUPPORT)
PREFILLED_SYRINGE | INTRAVENOUS | Status: AC
  Filled 2022-06-20: qty 10

## 2022-06-20 MED ORDER — ROCURONIUM BROMIDE 10 MG/ML (PF) SYRINGE
PREFILLED_SYRINGE | INTRAVENOUS | Status: AC
  Filled 2022-06-20: qty 10

## 2022-06-20 MED ORDER — DEXAMETHASONE SODIUM PHOSPHATE 10 MG/ML IJ SOLN
INTRAMUSCULAR | Status: DC | PRN
  Administered 2022-06-20: 4 mg via INTRAVENOUS
  Administered 2022-06-20: 8 mg via INTRAVENOUS

## 2022-06-20 MED ORDER — ONDANSETRON HCL 4 MG/2ML IJ SOLN
INTRAMUSCULAR | Status: AC
  Filled 2022-06-20: qty 2

## 2022-06-20 MED ORDER — ORAL CARE MOUTH RINSE: 15.0000 mL | Freq: Once | OROMUCOSAL | Status: AC

## 2022-06-20 MED ORDER — FENTANYL CITRATE (PF) 100 MCG/2ML IJ SOLN: 25.0000 ug | INTRAMUSCULAR | Status: DC | PRN

## 2022-06-20 MED ORDER — BUTAMBEN-TETRACAINE-BENZOCAINE 2-2-14 % EX AERO
1.0000 | INHALATION_SPRAY | Freq: Once | CUTANEOUS | Status: DC
  Filled 2022-06-20: qty 20

## 2022-06-20 MED ORDER — ONDANSETRON HCL 4 MG/2ML IJ SOLN
INTRAMUSCULAR | Status: DC | PRN
  Administered 2022-06-20: 4 mg via INTRAVENOUS

## 2022-06-20 MED ORDER — LIDOCAINE HCL URETHRAL/MUCOSAL 2 % EX GEL
1.0000 | Freq: Once | CUTANEOUS | Status: DC
  Filled 2022-06-20: qty 5

## 2022-06-20 MED ORDER — LIDOCAINE HCL (PF) 1 % IJ SOLN
30.0000 mL | Freq: Once | INTRAMUSCULAR | Status: DC
  Filled 2022-06-20: qty 30

## 2022-06-20 MED ORDER — PHENYLEPHRINE HCL 0.25 % NA SOLN
1.0000 | Freq: Four times a day (QID) | NASAL | Status: DC | PRN
  Filled 2022-06-20: qty 15

## 2022-06-20 MED ORDER — CHLORHEXIDINE GLUCONATE 0.12 % MT SOLN
OROMUCOSAL | Status: AC
  Administered 2022-06-20: 15 mL via OROMUCOSAL
  Filled 2022-06-20: qty 15

## 2022-06-20 MED ORDER — DEXAMETHASONE SODIUM PHOSPHATE 10 MG/ML IJ SOLN
INTRAMUSCULAR | Status: AC
  Filled 2022-06-20: qty 1

## 2022-06-20 MED ORDER — ROCURONIUM BROMIDE 100 MG/10ML IV SOLN
INTRAVENOUS | Status: DC | PRN
  Administered 2022-06-20: 20 mg via INTRAVENOUS
  Administered 2022-06-20: 60 mg via INTRAVENOUS
  Administered 2022-06-20: 10 mg via INTRAVENOUS
  Administered 2022-06-20 (×2): 20 mg via INTRAVENOUS

## 2022-06-20 MED ORDER — PROPOFOL 10 MG/ML IV BOLUS
INTRAVENOUS | Status: AC
  Filled 2022-06-20: qty 20

## 2022-06-20 MED ORDER — GLYCOPYRROLATE 0.2 MG/ML IJ SOLN
INTRAMUSCULAR | Status: AC
  Filled 2022-06-20: qty 1

## 2022-06-20 MED ORDER — PROPOFOL 10 MG/ML IV BOLUS
INTRAVENOUS | Status: DC | PRN
  Administered 2022-06-20: 200 mg via INTRAVENOUS

## 2022-06-20 MED ORDER — CHLORHEXIDINE GLUCONATE 0.12 % MT SOLN: 15.0000 mL | Freq: Once | OROMUCOSAL | Status: AC

## 2022-06-20 MED ORDER — FENTANYL CITRATE (PF) 100 MCG/2ML IJ SOLN
INTRAMUSCULAR | Status: DC | PRN
  Administered 2022-06-20: 50 ug via INTRAVENOUS

## 2022-06-20 MED ORDER — ONDANSETRON HCL 4 MG/2ML IJ SOLN: 4.0000 mg | Freq: Once | INTRAMUSCULAR | Status: DC | PRN

## 2022-06-20 MED ORDER — LACTATED RINGERS IV SOLN: INTRAVENOUS | Status: DC

## 2022-06-20 MED ORDER — SUGAMMADEX SODIUM 200 MG/2ML IV SOLN
INTRAVENOUS | Status: DC | PRN
  Administered 2022-06-20: 200 mg via INTRAVENOUS

## 2022-06-20 MED ORDER — FENTANYL CITRATE (PF) 100 MCG/2ML IJ SOLN
INTRAMUSCULAR | Status: AC
  Filled 2022-06-20: qty 2

## 2022-06-20 MED ORDER — LIDOCAINE HCL (PF) 2 % IJ SOLN
INTRAMUSCULAR | Status: AC
  Filled 2022-06-20: qty 5

## 2022-06-20 MED ORDER — KETAMINE HCL 50 MG/5ML IJ SOSY
PREFILLED_SYRINGE | INTRAMUSCULAR | Status: AC
  Filled 2022-06-20: qty 5

## 2022-06-20 MED ORDER — SUGAMMADEX SODIUM 500 MG/5ML IV SOLN
INTRAVENOUS | Status: AC
  Filled 2022-06-20: qty 5

## 2022-06-20 MED ORDER — MIDAZOLAM HCL 2 MG/2ML IJ SOLN
INTRAMUSCULAR | Status: AC
  Filled 2022-06-20: qty 2

## 2022-06-20 MED ORDER — PHENYLEPHRINE 80 MCG/ML (10ML) SYRINGE FOR IV PUSH (FOR BLOOD PRESSURE SUPPORT)
PREFILLED_SYRINGE | INTRAVENOUS | Status: DC | PRN
  Administered 2022-06-20: 160 ug via INTRAVENOUS
  Administered 2022-06-20: 80 ug via INTRAVENOUS
  Administered 2022-06-20 (×2): 160 ug via INTRAVENOUS
  Administered 2022-06-20: 120 ug via INTRAVENOUS

## 2022-06-20 MED ORDER — LIDOCAINE HCL (CARDIAC) PF 100 MG/5ML IV SOSY
PREFILLED_SYRINGE | INTRAVENOUS | Status: DC | PRN
  Administered 2022-06-20: 100 mg via INTRAVENOUS

## 2022-06-20 NOTE — Discharge Instructions (Signed)
AMBULATORY SURGERY  ?DISCHARGE INSTRUCTIONS ? ? ?The drugs that you were given will stay in your system until tomorrow so for the next 24 hours you should not: ? ?Drive an automobile ?Make any legal decisions ?Drink any alcoholic beverage ? ? ?You may resume regular meals tomorrow.  Today it is better to start with liquids and gradually work up to solid foods. ? ?You may eat anything you prefer, but it is better to start with liquids, then soup and crackers, and gradually work up to solid foods. ? ? ?Please notify your doctor immediately if you have any unusual bleeding, trouble breathing, redness and pain at the surgery site, drainage, fever, or pain not relieved by medication. ? ? ? ?Additional Instructions: ? ? ? ?Please contact your physician with any problems or Same Day Surgery at [phone], Monday through Friday 6 am to 4 pm, or [facility] Main number at [phone].  ?

## 2022-06-20 NOTE — Procedures (Signed)
[facility] ROBOTIC NAVIGATIONAL BRONCHOSCOPY PROCEDURE NOTE  FIBEROPTIC BRONCHOSCOPY WITH THERAPEUTIC ASPIRATION OF TRACHEOBRONCHIAL TREE AND BAL PROCEDURE NOTE  ENDOBRONCHIAL ULTRASOUND X 3 LYMPH NODES PROCEDURE NOTE    Flexible bronchoscopy was performed  by : Lanney Gins MD  assistance by : 1)Repiratory therapist  and 2)[employer] staff and 3) Anesthesia team and 4) Flouroscopy team and 5) [facility] supporting staff   Indication for the procedure was :  Pre-procedural H&P. The following assessment was performed on the day of the procedure prior to initiating sedation History:  Chest pain n Dyspnea y Hemoptysis n Cough y Fever n Other pertinent items n  Examination Vital signs -reviewed as per nursing documentation today Cardiac    Murmurs: n  Rubs : n  Gallop: n Lungs Wheezing: n Rales : n Rhonchi :y  Other pertinent findings: SOB/hypoxemia due to chronic lung disease   Pre-procedural assessment for Procedural Sedation included: Depth of sedation: As per anesthesia team  ASA Classification:  2 Mallampati airway assessment: 3    Medication list reviewed: y  The patient's interval history was taken and revealed: no new complaints The pre- procedure physical examination revealed: No new findings Refer to prior clinic note for details.  Informed Consent: Informed consent was obtained from:  patient after explanation of procedure and risks, benefits, as well as alternative procedures available.  Explanation of level of sedation and possible transfusion was also provided.    Procedural Preparation: Time out was performed and patient was identified by name and birthdate and procedure to be performed and side for sampling, if any, was specified. Pt was intubated by anesthesia.  The patient was appropriately draped.   Fiberoptic bronchoscopy with airway inspection therapeutic aspiration of tracheobronchial tree and BAL Procedure findings:  Bronchoscope was  inserted via ETT  without difficulty.  Posterior oropharynx, epiglottis, arytenoids, false cords and vocal cords were not visualized as these were bypassed by endotracheal tube. The distal trachea was normal in circumference and appearance without mucosal, cartilaginous or branching abnormalities.  The main carina was splayed . All right and left lobar airways were visualized to the Subsegmental level.  Sub- sub segmental carinae were identified in all the distal airways.   Secretions were visible in the following airways and appeared to be clear.  The mucosa was : friable at RUL and LLL  Airways were notable for:        exophytic lesions :n       extrinsic compression in the following distributions: left lower lobe       Friable mucosa: y       Neurosurgeon /pigmentation: n   The airways appeared to have milky exudate with thick phlegm bilaterally.  Therapeutic aspiration was performed bilaterally but more phlegm continued to pour out of distal airways and occluding bronchoscopy.  We performed BAL for microbiology at [facility].  We performed multiple aspirations due to copious amounts of purulent debris present is distal subsegmental airways.    Post procedure Diagnosis:   Splayed main carina, copious mucopurulent debris aspirated,  BAL for microbiology performed at [facility]     Electromagnetic Navigational Bronchoscopy Procedure Findings:  After appropriate CT-guided planning [facility]  scope was advanced via endotracheal tube .  Post appropriate planning and registration peripheral navigation was used to visualize target lesion.    LEFT LOWER LOBE TRANSBRONCHIAL FNA X 4 WAS PERFORMED  LEFT LOWER LOBE BAL FOR CYTOLOGY WAS PERFORMED LEFT LOWER LOBE CYTOBRUSH WAS PERFORMED X 1  LEFT LOWER LOBE SURGICAL PATHOLOGY WAS  PERFORMED X 7  RIGHT UPPER LOBE FNA TRANSBRONCHIAL BIOPSY X 2 PERFORMED RIGHT UPPER LOBE SURGICAL PATHOLOGY X 3 WAS PERFORMED  RIGHT UPPER LOBE BAL X 1 WAS PERFORMED   Post  procedure diagnosis:   PRELIMINARY READ OF ATYPICAL CELLS CONSISTENT WITH GASTROINTESTINAL ORIGIN       Endobronchial ultrasound assisted hilar and mediastinal lymph node biopsies procedure findings: The fiberoptic bronchoscope was removed and the EBUS scope was introduced. Examination began to evaluate for pathologically enlarged lymph nodes starting on the RIGHT side progressing to the LEF side.  All lymph node biopsies performed with 21G needle. Lymph node biopsies were sent in cytolite for all stations. STATION 7 -2.3CM- 4 PASSES STATION -10L 2CM - 3 PASSES STATION -11L 1.8CM -COULD NOT ACCESS DUE TO OVERLYING ARTERIAL BRANCH STATION 10R - 1.7CM - 3 PASSES  Post procedure diagnosis:  HILAR AND MEDIASTINAL LYMPHADENOPATHY COSNSISTENT WITH METS   Additional Specimens obtained included:                 Immediate sampling complications included:NONE  Epinephrine NONE ml was used topically  The bronchoscopy was terminated due to completion of the planned procedure and the bronchoscope was removed.   Total dosage of Lidocaine was NONE mg Total fluoroscopy time was AS PER RADS minutes  Supplemental oxygen was provided at AS PER ANESTHESIA lpm by nasal canula post operatively  Estimated Blood loss: EXPECTED <10CCcc.  Complications included:  NONE IMMEDIATE  Preliminary CXR findings :  IN PROCESS   Disposition: HOME WITH HUSBAND  Follow up with Dr. Lanney Gins in 5 days for result discussion.     Ottie Glazier MD  [place] Division of Pulmonary & Critical Care Medicine

## 2022-06-20 NOTE — Transfer of Care (Signed)
Immediate Anesthesia Transfer of Care Note  Patient: Sarah Marquez  Procedure(s) Performed: VIDEO BRONCHOSCOPY WITH ENDOBRONCHIAL ULTRASOUND ROBOTIC ASSISTED NAVIGATIONAL BRONCHOSCOPY  Patient Location: PACU  Anesthesia Type:General  Level of Consciousness: drowsy and patient cooperative  Airway & Oxygen Therapy: Patient Spontanous Breathing and Patient connected to face mask oxygen  Post-op Assessment: Report given to RN and Post -op Vital signs reviewed and stable  Post vital signs: Reviewed and stable  Last Vitals:  Vitals Value Taken Time  BP 127/67 06/20/22 1630  Temp    Pulse 73 06/20/22 1642  Resp 20 06/20/22 1642  SpO2 100 % 06/20/22 1642  Vitals shown include unvalidated device data.  Last Pain:  Vitals:   06/20/22 1105  TempSrc: Temporal  PainSc: 0-No pain         Complications: No notable events documented.

## 2022-06-20 NOTE — Anesthesia Preprocedure Evaluation (Signed)
Anesthesia Evaluation  Patient identified by MRN, date of birth, ID band Patient awake    Reviewed: Allergy & Precautions, NPO status , Patient's Chart, lab work & pertinent test results  History of Anesthesia Complications (+) DIFFICULT AIRWAY and history of anesthetic complications  Airway Mallampati: III  TM Distance: <3 FB Neck ROM: full    Dental  (+) Missing, Dental Advidsory Given   Pulmonary shortness of breath and with exertion, asthma , sleep apnea , neg recent URI   Pulmonary exam normal        Cardiovascular Exercise Tolerance: Good hypertension, (-) angina (-) Past MI and (-) Cardiac Stents Normal cardiovascular exam(-) dysrhythmias (-) Valvular Problems/Murmurs     Neuro/Psych  Neuromuscular disease  negative psych ROS   GI/Hepatic Neg liver ROS, PUD,GERD  Controlled,,  Endo/Other  negative endocrine ROS    Renal/GU      Musculoskeletal   Abdominal   Peds  Hematology negative hematology ROS (+)   Anesthesia Other Findings Past Medical History: No date: Arthritis     Comment:  neck, knees No date: Asthma No date: Cancer of the skin, basal cell     Comment:  a.) s/p excision from neck, back, and foot No date: Carpal tunnel syndrome No date: Colon polyps No date: Difficult intubation No date: Gastric ulcer No date: GERD (gastroesophageal reflux disease) No date: Gout No date: Hyperlipidemia No date: Hypertension No date: Iron deficiency anemia No date: OSA (obstructive sleep apnea)     Comment:  a.) does not require nocturnal PAP therapy No date: Vertigo     Comment:  last episode 12/2018 No date: Wears dentures     Comment:  partial upper  Past Surgical History: No date: ABDOMINAL HYSTERECTOMY No date: APPENDECTOMY No date: BACK SURGERY     Comment:  x2 FOR RUPTURED DISC 1990's: CARDIAC CATHETERIZATION 04/07/2019: CATARACT EXTRACTION W/PHACO; Left     Comment:  Procedure: CATARACT  EXTRACTION PHACO AND INTRAOCULAR               LENS PLACEMENT (IOC) LEFT 4.43, 00:34.1;  Surgeon:               Birder Robson, MD;  Location: [facility];                Service: Ophthalmology;  Laterality: Left;  Sleep               Apnea-does not wear CPAP Latex-adhesives 05/05/2019: CATARACT EXTRACTION W/PHACO; Right     Comment:  Procedure: CATARACT EXTRACTION PHACO AND INTRAOCULAR               LENS PLACEMENT (IOC) RIGHT;  4.83, 00:33.4;  Surgeon:               Birder Robson, MD;  Location: [place];                Service: Ophthalmology;  Laterality: Right;  SLEP               APNEA-CPAP No date: CHOLECYSTECTOMY No date: COLONOSCOPY 10/16/2017: COLONOSCOPY WITH PROPOFOL; N/A     Comment:  Procedure: COLONOSCOPY WITH PROPOFOL;  Surgeon: Manya Silvas, MD;  Location: [facility] END[facility]COPY;  Service:               Endoscopy;  Laterality: N/A; No date: ESOPHAGOGASTRODUODENOSCOPY 10/16/2017: ESOPHAGOGASTRODUODENOSCOPY (EGD) WITH PROPOFOL; N/A     Comment:  Procedure:  ESOPHAGOGASTRODUODENOSCOPY (EGD) WITH               PROPOFOL;  Surgeon: Manya Silvas, MD;  Location:               [facility] ENDOSCOPY;  Service: Endoscopy;  Laterality: N/A; No date: FUNCTIONAL ENDOSCOPIC SINUS SURGERY No date: knee arthoscopy No date: LUMBAR DISC SURGERY     Comment:  ruptured disc x 2 No date: TONSILLECTOMY AND ADENOIDECTOMY 10/25/2020: TOTAL KNEE ARTHROPLASTY; Right     Comment:  Procedure: TOTAL KNEE ARTHROPLASTY - Rachelle Hora to               assist;  Surgeon: Hessie Knows, MD;  Location: [facility];              Service: Orthopedics;  Laterality: Right;  BMI    Body Mass Index: 43.22 kg/m      Reproductive/Obstetrics negative OB ROS                             Anesthesia Physical Anesthesia Plan  ASA: 3  Anesthesia Plan: General   Post-op Pain Management:    Induction: Intravenous  PONV Risk Score and Plan: 3 and  Dexamethasone, Ondansetron and Treatment may vary due to age or medical condition  Airway Management Planned: Oral ETT  Additional Equipment:   Intra-op Plan:   Post-operative Plan: Extubation in OR  Informed Consent: I have reviewed the patients History and Physical, chart, labs and discussed the procedure including the risks, benefits and alternatives for the proposed anesthesia with the patient or authorized representative who has indicated his/her understanding and acceptance.     Dental Advisory Given  Plan Discussed with: Anesthesiologist, CRNA and Surgeon  Anesthesia Plan Comments: (Patient reports no bleeding problems and no anticoagulant use.  Plan for spinal with backup GA  Patient consented for risks of anesthesia including but not limited to:  - adverse reactions to medications - damage to eyes, teeth, lips or other oral mucosa - nerve damage due to positioning  - risk of bleeding, infection and or nerve damage from spinal that could lead to paralysis - risk of headache or failed spinal - damage to teeth, lips or other oral mucosa - sore throat or hoarseness - damage to heart, brain, nerves, lungs, other parts of body or loss of life  Patient voiced understanding.)        Anesthesia Quick Evaluation

## 2022-06-20 NOTE — H&P (Signed)
PULMONOLOGY         Date: 06/20/2022,   MRN# [id number] Sarah Marquez Aug 29, 1955     AdmissionWeight: 107 kg                 CurrentWeight: 107 kg  Referring provider: Dr Beverly Gust   CHIEF COMPLAINT:   Multiple bilateral hypermetabolic lung nodules with mediastinal lymphadenoapthy   HISTORY OF PRESENT ILLNESS   This is a very pleasant 67 yo F with hx of OA asthma , very distant history of smoking 50 years ago, did have skin cancer in the past and colonic polyps who was seen by ENT with cough.  She further had CT chest performed with multiple bilateral nodules noted. This was followed with oncology evaluation and PET scan which revealed hypermetabolic lesions bilaterally suggestive of neopleasm.    Left hilar adenopathy as well as right paratracheal adenopathy which could be nonspecific versus metastatic.  She is here today for airway inspection with bronchoscopy and biopsy of hypermetabolic lesions with EBUS assisted Lymph node biopsies   PAST MEDICAL HISTORY   Past Medical History:  Diagnosis Date   Arthritis    neck, knees   Asthma    Calcification of right carotid artery    Cancer of the skin, basal cell    a.) s/p excision from neck, back, and foot   Carpal tunnel syndrome    Colon polyps    Difficult intubation    Dyspnea    Gastric ulcer    GERD (gastroesophageal reflux disease)    Gout    History of 2019 novel coronavirus disease (COVID-19) 10/21/2020   Hyperlipidemia    Hypertension    Iron deficiency    Iron deficiency anemia    OSA (obstructive sleep apnea)    a.) does not require nocturnal PAP therapy   Pulmonary nodule    Vertigo    last episode 12/2018   Wears dentures    partial upper     SURGICAL HISTORY   Past Surgical History:  Procedure Laterality Date   ABDOMINAL HYSTERECTOMY     APPENDECTOMY     BACK SURGERY     x2 FOR RUPTURED DISC   CARDIAC CATHETERIZATION  1990's   CATARACT EXTRACTION W/PHACO Left 04/07/2019    Procedure: CATARACT EXTRACTION PHACO AND INTRAOCULAR LENS PLACEMENT (IOC) LEFT 4.43, 00:34.1;  Surgeon: Birder Robson, MD;  Location: [place];  Service: Ophthalmology;  Laterality: Left;  Sleep Apnea-does not wear CPAP Latex-adhesives   CATARACT EXTRACTION W/PHACO Right 05/05/2019   Procedure: CATARACT EXTRACTION PHACO AND INTRAOCULAR LENS PLACEMENT (IOC) RIGHT;  4.83, 00:33.4;  Surgeon: Birder Robson, MD;  Location: [place];  Service: Ophthalmology;  Laterality: Right;  SLEP APNEA-CPAP   CHOLECYSTECTOMY     COLONOSCOPY     COLONOSCOPY WITH PROPOFOL N/A 10/16/2017   Procedure: COLONOSCOPY WITH PROPOFOL;  Surgeon: Manya Silvas, MD;  Location: [facility] ENDOSCOPY;  Service: Endoscopy;  Laterality: N/A;   ESOPHAGOGASTRODUODENOSCOPY     ESOPHAGOGASTRODUODENOSCOPY (EGD) WITH PROPOFOL N/A 10/16/2017   Procedure: ESOPHAGOGASTRODUODENOSCOPY (EGD) WITH PROPOFOL;  Surgeon: Manya Silvas, MD;  Location: [facility] ENDOSCOPY;  Service: Endoscopy;  Laterality: N/A;   FUNCTIONAL ENDOSCOPIC SINUS SURGERY     knee arthoscopy     LUMBAR DISC SURGERY     ruptured disc x 2   TONSILLECTOMY AND ADENOIDECTOMY     TOTAL KNEE ARTHROPLASTY Right 10/25/2020   Procedure: TOTAL KNEE ARTHROPLASTY - Rachelle Hora to assist;  Surgeon: Hessie Knows,  MD;  Location: [facility];  Service: Orthopedics;  Laterality: Right;   TOTAL KNEE ARTHROPLASTY Left 07/06/2021   Procedure: TOTAL KNEE ARTHROPLASTY;  Surgeon: Hessie Knows, MD;  Location: [facility];  Service: Orthopedics;  Laterality: Left;     FAMILY HISTORY   Family History  Problem Relation Age of Onset   Breast cancer Mother 80   Heart attack Father    Heart attack Brother      SOCIAL HISTORY   Social History   Tobacco Use   Smoking status: Never   Smokeless tobacco: Never   Tobacco comments:    former social smoker  Vaping Use   Vaping Use: Never used  Substance Use Topics   Alcohol use: No    Alcohol/week: 0.0 standard  drinks of alcohol   Drug use: No     MEDICATIONS    Home Medication:    Current Medication:  Current Facility-Administered Medications:    lactated ringers infusion, , Intravenous, Continuous, Martha Clan, MD, Last Rate: 10 mL/hr at 06/20/22 1120, New Bag at 06/20/22 1120    ALLERGIES   Albuterol, Beclomethasone, Sulfa antibiotics, Latex, and Meloxicam     REVIEW OF SYSTEMS    Review of Systems:  Gen:  Denies  fever, sweats, chills weigh loss  HEENT: Denies blurred vision, double vision, ear pain, eye pain, hearing loss, nose bleeds, sore throat Cardiac:  No dizziness, chest pain or heaviness, chest tightness,edema Resp:   reports dyspnea chronically  Gi: Denies swallowing difficulty, stomach pain, nausea or vomiting, diarrhea, constipation, bowel incontinence Gu:  Denies bladder incontinence, burning urine Ext:   Denies Joint pain, stiffness or swelling Skin: Denies  skin rash, easy bruising or bleeding or hives Endoc:  Denies polyuria, polydipsia , polyphagia or weight change Psych:   Denies depression, insomnia or hallucinations   Other:  All other systems negative   VS: BP (!) 147/81   Pulse 87   Temp 98.2 F (36.8 C) (Temporal)   Resp 16   Ht 5' 5"$  (1.651 m)   Wt 107 kg   SpO2 98%   BMI 39.27 kg/m      PHYSICAL EXAM    GENERAL:NAD, no fevers, chills, no weakness no fatigue HEAD: Normocephalic, atraumatic.  EYES: Pupils equal, round, reactive to light. Extraocular muscles intact. No scleral icterus.  MOUTH: Moist mucosal membrane. Dentition intact. No abscess noted.  EAR, NOSE, THROAT: Clear without exudates. No external lesions.  NECK: Supple. No thyromegaly. No nodules. No JVD.  PULMONARY: decreased breath sounds with mild rhonchi worse at bases bilaterally.  CARDIOVASCULAR: S1 and S2. Regular rate and rhythm. No murmurs, rubs, or gallops. No edema. Pedal pulses 2+ bilaterally.  GASTROINTESTINAL: Soft, nontender, nondistended. No masses.  Positive bowel sounds. No hepatosplenomegaly.  MUSCULOSKELETAL: No swelling, clubbing, or edema. Range of motion full in all extremities.  NEUROLOGIC: Cranial nerves II through XII are intact. No gross focal neurological deficits. Sensation intact. Reflexes intact.  SKIN: No ulceration, lesions, rashes, or cyanosis. Skin warm and dry. Turgor intact.  PSYCHIATRIC: Mood, affect within normal limits. The patient is awake, alert and oriented x 3. Insight, judgment intact.       IMAGING   Reviewed CT and PET from Jan 2024 - bilateral lung nodules are hypermetabolic on PET  ASSESSMENT/PLAN   Bilateral lung nodules with hilar and mediastinal lymphadenopathy   Plan for bilateral airway inspection therapeutic aspiration of any airways noted to have mucus plugging, bronchial washings and BAL with flexible fiberoptic bronchoscopy followed by [facility]  robotic bronchoscopy for endobronchial and transbronchial biopsies and lymph node biopsies via EBUS bronchoscopy   -Reviewed risks/complications and benefits with patient, risks include infection, pneumothorax/pneumomediastinum which may require chest tube placement as well as overnight/prolonged hospitalization and possible mechanical ventilation. Other risks include bleeding and very rarely death.  Patient understands risks and wishes to proceed.  Additional questions were answered, and patient is aware that post procedure patient will be going home with family and may experience cough with possible clots on expectoration as well as phlegm which may last few days as well as hoarseness of voice post intubation and mechanical ventilation.         Thank you for allowing me to participate in the care of this patient.   Patient/Family are satisfied with care plan and all questions have been answered.    Provider disclosure: Patient with at least one acute or chronic illness or injury that poses a threat to life or bodily function and is being managed  actively during this encounter.  All of the below services have been performed independently by signing provider:  review of prior documentation from internal and or external health records.  Review of previous and current lab results.  Interview and comprehensive assessment during patient visit today. Review of current and previous chest radiographs/CT scans. Discussion of management and test interpretation with health care team and patient/family.   This document was prepared using Dragon voice recognition software and may include unintentional dictation errors.     Ottie Glazier, M.D.  Division of Pulmonary & Critical Care Medicine

## 2022-06-20 NOTE — Anesthesia Postprocedure Evaluation (Signed)
Anesthesia Post Note  Patient: Sarah Marquez  Procedure(s) Performed: VIDEO BRONCHOSCOPY WITH ENDOBRONCHIAL ULTRASOUND ROBOTIC ASSISTED NAVIGATIONAL BRONCHOSCOPY  Patient location during evaluation: PACU Anesthesia Type: General Level of consciousness: awake and alert Pain management: pain level controlled Vital Signs Assessment: post-procedure vital signs reviewed and stable Respiratory status: spontaneous breathing, nonlabored ventilation, respiratory function stable and patient connected to nasal cannula oxygen Cardiovascular status: blood pressure returned to baseline and stable Postop Assessment: no apparent nausea or vomiting Anesthetic complications: no   No notable events documented.   Last Vitals:  Vitals:   06/20/22 1645 06/20/22 1700  BP: (!) 115/59 123/72  Pulse: 73 73  Resp: 19 (!) 23  Temp:    SpO2: 100% 100%    Last Pain:  Vitals:   06/20/22 1700  TempSrc:   PainSc: 0-No pain                 Arita Miss

## 2022-06-21 ENCOUNTER — Encounter: Payer: Self-pay | Admitting: Pulmonary Disease

## 2022-06-21 LAB — CYTOLOGY - NON PAP

## 2022-06-21 LAB — SURGICAL PATHOLOGY

## 2022-06-22 ENCOUNTER — Other Ambulatory Visit: Payer: Self-pay | Admitting: *Deleted

## 2022-06-22 ENCOUNTER — Encounter: Payer: Self-pay | Admitting: *Deleted

## 2022-06-22 MED ORDER — HYDROCODONE BIT-HOMATROP MBR 5-1.5 MG/5ML PO SOLN: 5.0000 mL | Freq: Two times a day (BID) | ORAL | 0 refills | Status: DC

## 2022-06-22 NOTE — Progress Notes (Signed)
Per Dr. Janese Banks, need to add pt to tumor board for 2/29 to review results and discuss next steps. Dr. Janese Banks would like to see pt after tumor board discussion and requests that her appt on 2/26 be moved to 3/4. Message sent to scheduling and pt will be notified with appt change as well as MD recommendations.

## 2022-06-22 NOTE — Telephone Encounter (Signed)
error 

## 2022-06-22 NOTE — Telephone Encounter (Addendum)
Patient states Dr Janese Banks increased  her cough medicine to twice a day and said she would refill it for her when needed

## 2022-06-22 NOTE — Telephone Encounter (Signed)
Pt made aware of refill being sent into pharmacy and requests that it go to [facility] in [place]. Rx has been pended. Left message with [facility] to cancel rx sent in earlier.

## 2022-06-22 NOTE — Addendum Note (Signed)
Addended by: Telford Nab on: 06/22/2022 12:53 PM   Modules accepted: Orders

## 2022-06-23 LAB — ACID FAST SMEAR (AFB, MYCOBACTERIA): Acid Fast Smear: NEGATIVE

## 2022-06-23 LAB — ASPERGILLUS ANTIGEN, BAL/SERUM: Aspergillus Ag, BAL/Serum: 0.04 Index (ref 0.00–0.49)

## 2022-06-24 LAB — CULTURE, BAL-QUANTITATIVE W GRAM STAIN: Culture: NO GROWTH

## 2022-06-25 ENCOUNTER — Other Ambulatory Visit: Payer: Self-pay | Admitting: Nurse Practitioner

## 2022-06-25 ENCOUNTER — Other Ambulatory Visit: Payer: Self-pay | Admitting: *Deleted

## 2022-06-25 ENCOUNTER — Inpatient Hospital Stay: Payer: PPO | Admitting: Oncology

## 2022-06-25 ENCOUNTER — Other Ambulatory Visit: Payer: Self-pay | Admitting: Oncology

## 2022-06-25 DIAGNOSIS — R059 Cough, unspecified: Secondary | ICD-10-CM

## 2022-06-25 MED ORDER — HYDROCODONE BIT-HOMATROP MBR 5-1.5 MG/5ML PO SOLN: 5.0000 mL | Freq: Two times a day (BID) | ORAL | 0 refills | Status: DC | PRN

## 2022-06-25 MED ORDER — HYDROCODONE BIT-HOMATROP MBR 5-1.5 MG/5ML PO SOLN: 5.0000 mL | Freq: Two times a day (BID) | ORAL | 0 refills | Status: DC

## 2022-06-25 NOTE — Progress Notes (Signed)
Hycodan refill printed for patient at request of Dr Janese Banks.

## 2022-06-25 NOTE — Telephone Encounter (Signed)
Pt called in to ask if can get a printed prescription for hycodan since [facility] in [place] is unable to receive electronic prescriptions at this time.

## 2022-06-26 ENCOUNTER — Other Ambulatory Visit: Payer: Self-pay | Admitting: *Deleted

## 2022-06-28 ENCOUNTER — Inpatient Hospital Stay: Payer: PPO

## 2022-06-29 DIAGNOSIS — R918 Other nonspecific abnormal finding of lung field: Secondary | ICD-10-CM | POA: Diagnosis not present

## 2022-06-29 DIAGNOSIS — R911 Solitary pulmonary nodule: Secondary | ICD-10-CM | POA: Diagnosis not present

## 2022-06-30 IMAGING — DX DG KNEE 1-2V*L*
2 series · 2 of 2 positions shown · non-contrast
Comparison: None.

CLINICAL DATA: Left knee arthroplasty

EXAM:
LEFT KNEE - 1-2 VIEW

[knee ap]
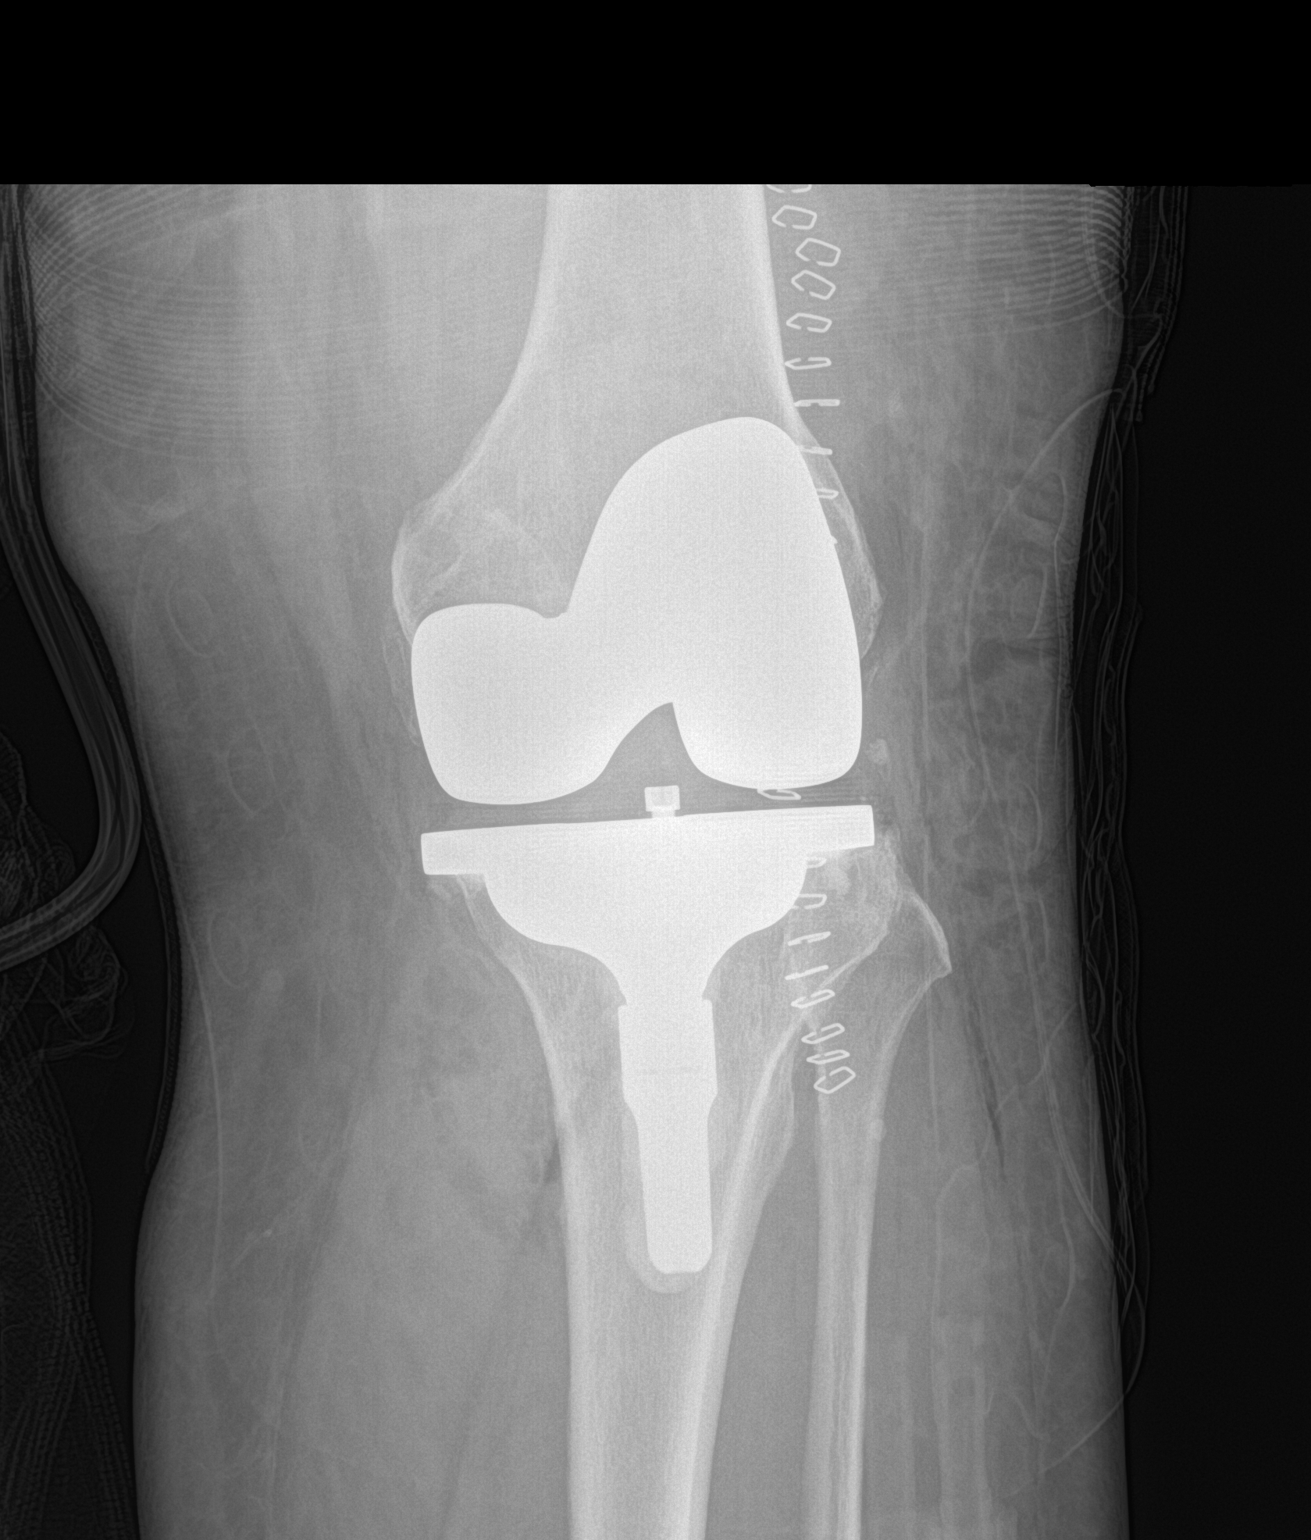

[knee lat]
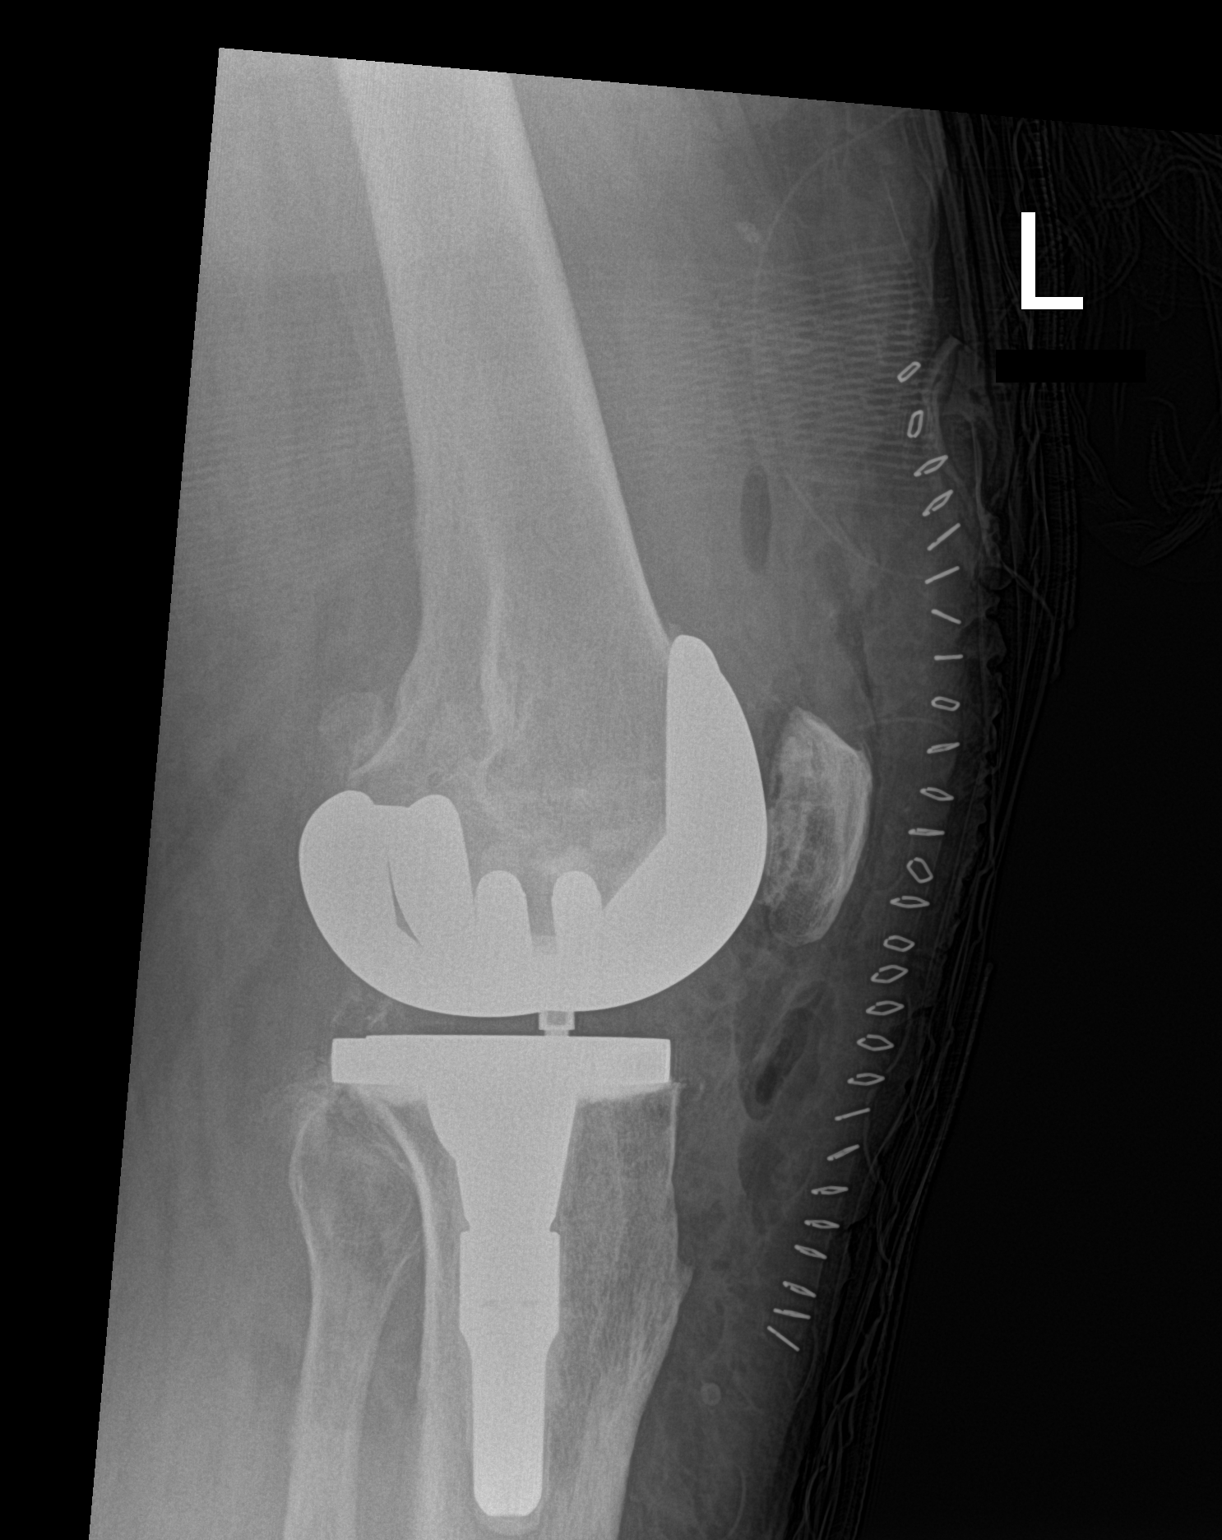

[2 of 2 positions shown; findings below may reference images not displayed]

FINDINGS: Postsurgical changes from left total knee arthroplasty. Arthroplasty
components are in their expected alignment. No periprosthetic
fracture or evidence of other complication. Expected postoperative
changes within the overlying soft tissues.
IMPRESSION: Satisfactory postoperative appearance status post left total knee
arthroplasty.

## 2022-07-02 ENCOUNTER — Encounter: Payer: Self-pay | Admitting: *Deleted

## 2022-07-02 ENCOUNTER — Inpatient Hospital Stay: Payer: PPO | Attending: Oncology | Admitting: Oncology

## 2022-07-02 ENCOUNTER — Encounter: Payer: Self-pay | Admitting: Oncology

## 2022-07-02 VITALS — BP 112/64 | HR 71 | Temp 97.2°F | Resp 18 | Ht 65.0 in | Wt 236.2 lb

## 2022-07-02 DIAGNOSIS — Z803 Family history of malignant neoplasm of breast: Secondary | ICD-10-CM | POA: Diagnosis not present

## 2022-07-02 DIAGNOSIS — R918 Other nonspecific abnormal finding of lung field: Secondary | ICD-10-CM | POA: Diagnosis not present

## 2022-07-02 DIAGNOSIS — K869 Disease of pancreas, unspecified: Secondary | ICD-10-CM

## 2022-07-02 NOTE — Progress Notes (Signed)
No concerns toady.

## 2022-07-02 NOTE — Progress Notes (Signed)
Hematology/Oncology Consult note [facility]  [phone] [phone]  Patient Care Team: Eulas Post, MD as PCP - General (Family Medicine) Valente David, RN as [facility] [place], [place], [place] as Oncology Nurse Navigator   Name of the patient: Sarah Marquez  OI:[id number]  1956-03-04   Date of visit: 07/02/22  Diagnosis-bilateral lung nodules  Chief complaint/ Reason for visit-discussed bronchoscopy results and further management  Heme/Onc history: Patient is a 67 year old female with a remote history of smoking in her teenage years.  She was seen by ENT for symptoms of persistent cough.  This was followed by a chest x-ray in December 2023 which showed patchy opacities in the bilateral lungs concerning for multifocal pneumonia.  This was followed by a CT chest with contrast in January 2024 which showed numerous bilateral lung nodules with the largest 1 in the left upper lobe measuring 2.5 x 1.9 cm.  No pleural effusion or pneumothorax.  Enlarged mediastinal and hilar lymph nodes measuring up to 1.3 cm.  Findings were concerning for lung cancer and a PET CT scan was recommended.  Patient was seen by pulmonary Dr.Aleskerov and underwent a PET CT scan on 05/28/2022 which showed multiple lung nodules too numerous to count ranging from 1 cm to 2.7 cm which were hypermetabolic on the PET scan.  Tiny left supraclavicular lymph node measuring 0.6 cm with an SUV of 2.18.  Left hilar adenopathy as well as right paratracheal adenopathy which could be nonspecific versus metastatic.  PET CT scan findings consistent with cirrhosis and splenomegaly.  Tracer uptake in the tail of the pancreas of indeterminate etiology.    Interval history-patient states that overall her cough is improved.  She was seen by Dr. Lanney Gins cardiology office yesterday and underwent additional blood work.  ECOG PS- 1 Pain scale- 0   Review of systems-  Review of Systems  Constitutional:  Negative for chills, fever, malaise/fatigue and weight loss.  HENT:  Negative for congestion, ear discharge and nosebleeds.   Eyes:  Negative for blurred vision.  Respiratory:  Negative for cough, hemoptysis, sputum production, shortness of breath and wheezing.   Cardiovascular:  Negative for chest pain, palpitations, orthopnea and claudication.  Gastrointestinal:  Negative for abdominal pain, blood in stool, constipation, diarrhea, heartburn, melena, nausea and vomiting.  Genitourinary:  Negative for dysuria, flank pain, frequency, hematuria and urgency.  Musculoskeletal:  Negative for back pain, joint pain and myalgias.  Skin:  Negative for rash.  Neurological:  Negative for dizziness, tingling, focal weakness, seizures, weakness and headaches.  Endo/Heme/Allergies:  Does not bruise/bleed easily.  Psychiatric/Behavioral:  Negative for depression and suicidal ideas. The patient does not have insomnia.       Allergies  Allergen Reactions   Albuterol     Nebulizer form, causes wheezing   Beclomethasone Swelling    Beconase Nose spray swelled face up Currently uses Qvar on occasion and it does not cause problems   Sulfa Antibiotics Hives   Latex Rash    Adhesives, bandaids Paper tape is okay   Meloxicam Palpitations     Past Medical History:  Diagnosis Date   Arthritis    neck, knees   Asthma    Calcification of right carotid artery    Cancer of the skin, basal cell    a.) s/p excision from neck, back, and foot   Carpal tunnel syndrome    Colon polyps    Difficult intubation    Dyspnea  Gastric ulcer    GERD (gastroesophageal reflux disease)    Gout    History of 2019 novel coronavirus disease (COVID-19) 10/21/2020   Hyperlipidemia    Hypertension    Iron deficiency    Iron deficiency anemia    OSA (obstructive sleep apnea)    a.) does not require nocturnal PAP therapy   Pulmonary nodule    Vertigo    last episode 12/2018    Wears dentures    partial upper     Past Surgical History:  Procedure Laterality Date   ABDOMINAL HYSTERECTOMY     APPENDECTOMY     BACK SURGERY     x2 FOR RUPTURED DISC   CARDIAC CATHETERIZATION  1990's   CATARACT EXTRACTION W/PHACO Left 04/07/2019   Procedure: CATARACT EXTRACTION PHACO AND INTRAOCULAR LENS PLACEMENT (IOC) LEFT 4.43, 00:34.1;  Surgeon: Birder Robson, MD;  Location: [place];  Service: Ophthalmology;  Laterality: Left;  Sleep Apnea-does not wear CPAP Latex-adhesives   CATARACT EXTRACTION W/PHACO Right 05/05/2019   Procedure: CATARACT EXTRACTION PHACO AND INTRAOCULAR LENS PLACEMENT (IOC) RIGHT;  4.83, 00:33.4;  Surgeon: Birder Robson, MD;  Location: [place];  Service: Ophthalmology;  Laterality: Right;  SLEP APNEA-CPAP   CHOLECYSTECTOMY     COLONOSCOPY     COLONOSCOPY WITH PROPOFOL N/A 10/16/2017   Procedure: COLONOSCOPY WITH PROPOFOL;  Surgeon: Manya Silvas, MD;  [facility] END[facility]COPY;  Service: Endoscopy;  Laterality: N/A;   ESOPHAGOGASTRODUODENOSCOPY     ESOPHAGOGASTRODUODENOSCOPY (EGD) WITH PROPOFOL N/A 10/16/2017   Procedure: ESOPHAGOGASTRODUODENOSCOPY (EGD) WITH PROPOFOL;  Surgeon: Manya Silvas, MD;  Location: [facility] ENDOSCOPY;  Service: Endoscopy;  Laterality: N/A;   FUNCTIONAL ENDOSCOPIC SINUS SURGERY     knee arthoscopy     LUMBAR DISC SURGERY     ruptured disc x 2   TONSILLECTOMY AND ADENOIDECTOMY     TOTAL KNEE ARTHROPLASTY Right 10/25/2020   Procedure: TOTAL KNEE ARTHROPLASTY - Rachelle Hora to assist;  Surgeon: Hessie Knows, MD;  Location: [facility];  Service: Orthopedics;  Laterality: Right;   TOTAL KNEE ARTHROPLASTY Left 07/06/2021   Procedure: TOTAL KNEE ARTHROPLASTY;  Surgeon: Hessie Knows, MD;  Location: [facility];  Service: Orthopedics;  Laterality: Left;   VIDEO BRONCHOSCOPY WITH ENDOBRONCHIAL ULTRASOUND N/A 06/20/2022   Procedure: VIDEO BRONCHOSCOPY WITH ENDOBRONCHIAL ULTRASOUND;  Surgeon: Ottie Glazier,  MD;  Location: [facility];  Service: Thoracic;  Laterality: N/A;    Social History   Socioeconomic History   Marital status: Married    Spouse name: Elberta Fortis   Number of children: Not on file   Years of education: Not on file   Highest education level: Not on file  Occupational History   Occupation: variety of office jobs    Comment: retired  Tobacco Use   Smoking status: Never   Smokeless tobacco: Never   Tobacco comments:    former social smoker  Scientific laboratory technician Use: Never used  Substance and Sexual Activity   Alcohol use: No    Alcohol/week: 0.0 standard drinks of alcohol   Drug use: No   Sexual activity: Yes    Birth control/protection: Post-menopausal  Other Topics Concern   Not on file  Social History Narrative   Patient lives with husband and an older son.   Patient feels safe in home. Son has Muscular dystrophy and they have a ramp.   Social Determinants of Health   Financial Resource Strain: Not on file  Food Insecurity: No Food Insecurity (06/04/2022)   Hunger  Vital Sign    Worried About Charity fundraiser in the Last Year: Never true    Ran Out of Food in the Last Year: Never true  Transportation Needs: No Transportation Needs (06/04/2022)   PRAPARE - Hydrologist (Medical): No    Lack of Transportation (Non-Medical): No  Physical Activity: Inactive (12/20/2021)   Exercise Vital Sign    Days of Exercise per Week: 0 days    Minutes of Exercise per Session: 0 min  Stress: No Stress Concern Present (12/20/2021)   [place]    Feeling of Stress : Not at all  Social Connections: [place] (12/20/2021)   Social Connection and Isolation Panel [NHANES]    Frequency of Communication with Friends and Family: More than three times a week    Frequency of Social Gatherings with Friends and Family: More than three times a week    Attends Religious Services: More than  4 times per year    Active Member of [employer] or Organizations: Yes    Attends Music therapist: More than 4 times per year    Marital Status: Married  Human resources officer Violence: Not At Risk (06/04/2022)   Humiliation, Afraid, Rape, and Kick questionnaire    Fear of Current or Ex-Partner: No    Emotionally Abused: No    Physically Abused: No    Sexually Abused: No    Family History  Problem Relation Age of Onset   Breast cancer Mother 27   Heart attack Father    Heart attack Brother      Current Outpatient Medications:    beclomethasone (QVAR) 80 MCG/ACT inhaler, Inhale 2 puffs into the lungs 2 (two) times daily as needed (shortness of breath)., Disp: , Rfl:    etodolac (LODINE) 400 MG tablet, Take 400 mg by mouth 2 (two) times daily., Disp: , Rfl:    HYDROcodone bit-homatropine (HYCODAN) 5-1.5 MG/5ML syrup, Take 5 mLs by mouth 2 (two) times daily., Disp: 120 mL, Rfl: 0   HYDROcodone bit-homatropine (HYCODAN) 5-1.5 MG/5ML syrup, Take 5 mLs by mouth 2 (two) times daily as needed for cough., Disp: 120 mL, Rfl: 0   losartan (COZAAR) 50 MG tablet, Take 1 tablet (50 mg total) by mouth daily., Disp: 90 tablet, Rfl: 3   montelukast (SINGULAIR) 10 MG tablet, TAKE 1 TABLET (10 MG TOTAL) BY MOUTH DAILY., Disp: 30 tablet, Rfl: 2   Multiple Vitamins-Minerals (PRESERVISION AREDS 2 PO), Take 1 tablet by mouth in the morning and at bedtime., Disp: , Rfl:    pantoprazole (PROTONIX) 40 MG tablet, Take 1 tablet (40 mg total) by mouth 2 (two) times daily., Disp: 180 tablet, Rfl: 0   triamcinolone ointment (KENALOG) 0.1 %, Apply topically 2 (two) times daily as needed., Disp: , Rfl:    Vitamin D, Ergocalciferol, (DRISDOL) 1.25 MG (50000 UNIT) CAPS capsule, TAKE 1 CAPSULE (50,000 UNITS TOTAL) BY MOUTH EVERY 7 (SEVEN) DAYS. (TAKING ONE TABLET PER WEEK), Disp: 4 capsule, Rfl: 5  Physical exam:  Vitals:   07/02/22 1101  BP: 112/64  Pulse: 71  Resp: 18  Temp: (!) 97.2 F (36.2 C)  TempSrc:  Tympanic  SpO2: 100%  Weight: 236 lb 3.2 oz (107.1 kg)  Height: '5\' 5"'$  (1.651 m)   Physical Exam Cardiovascular:     Rate and Rhythm: Normal rate and regular rhythm.     Heart sounds: Normal heart sounds.  Pulmonary:  Effort: Pulmonary effort is normal.     Breath sounds: Normal breath sounds.  Skin:    General: Skin is warm and dry.  Neurological:     Mental Status: She is alert and oriented to person, place, and time.         Latest Ref Rng & Units 06/04/2022    2:36 PM  CMP  Glucose 70 - 99 mg/dL 95   BUN 8 - 23 mg/dL 16   Creatinine 0.44 - 1.00 mg/dL 1.10   Sodium 135 - 145 mmol/L 136   Potassium 3.5 - 5.1 mmol/L 4.0   Chloride 98 - 111 mmol/L 103   CO2 22 - 32 mmol/L 25   Calcium 8.9 - 10.3 mg/dL 8.8   Total Protein 6.5 - 8.1 g/dL 6.9   Total Bilirubin 0.3 - 1.2 mg/dL 1.1   Alkaline Phos 38 - 126 U/L 84   AST 15 - 41 U/L 20   ALT 0 - 44 U/L 9       Latest Ref Rng & Units 06/18/2022    9:02 AM  CBC  WBC 4.0 - 10.5 K/uL 4.8   Hemoglobin 12.0 - 15.0 g/dL 11.4   Hematocrit 36.0 - 46.0 % 36.3   Platelets 150 - 400 K/uL 179     No images are attached to the encounter.  CT CHEST WO CONTRAST  Result Date: 06/21/2022 CLINICAL DATA:  Preoperative evaluation for bronchoscopy, history of metastatic disease of unknown primary EXAM: CT CHEST WITHOUT CONTRAST TECHNIQUE: Multidetector CT imaging of the chest was performed following the standard protocol without IV contrast. RADIATION DOSE REDUCTION: This exam was performed according to the departmental dose-optimization program which includes automated exposure control, adjustment of the mA and/or kV according to patient size and/or use of iterative reconstruction technique. COMPARISON:  05/28/2022, 05/03/2022 FINDINGS: Cardiovascular: Unenhanced imaging of the heart is unremarkable without pericardial effusion. There is calcification of the mitral and aortic valves. Prominent coronary artery atherosclerosis within the LAD  distribution. Normal caliber of the thoracic aorta. Atherosclerosis of the aortic arch. Mediastinum/Nodes: Thyroid, trachea, and esophagus are unremarkable. There has been significant improvement in the mediastinal adenopathy seen previously. There are no pathologically enlarged mediastinal lymph nodes identified. Largest measures 7 mm in the right paratracheal region, unchanged since prior exam. The hilar adenopathy seen on prior study is more difficult to evaluate without intravenous contrast. Lungs/Pleura: There are numerous bilateral pulmonary nodules again identified. There has been waxing and waning appearance of the nodules since prior exam, with index nodules as follows: Left upper lobe, image 36/4, 17 x 13 mm.  Previously 25 x 19 mm. Right middle lobe, image 79/4, 18 x 16 mm.  Previously 23 x 16 mm. Left lower lobe, image 98/4, 21 x 15 mm.  Previously 16 x 15 mm. Right lower lobe, image 71/4, 15 x 14 mm.  Previously 12 x 13 mm. No effusion or pneumothorax.  Central airways are patent. Upper Abdomen: Splenomegaly. Nodular contour of the liver capsule consistent with cirrhosis. Otherwise unremarkable unenhanced appearance of the upper abdomen. Musculoskeletal: No acute or destructive bony lesions. Reconstructed images demonstrate no additional findings. IMPRESSION: 1. Innumerable bilateral pulmonary nodules as above, with some nodules demonstrating significant reduction in size while others have increased. Differential diagnosis would include diffuse metastatic disease or atypical infection. Please correlate with bronchoscopy results. 2. Resolution of the mediastinal adenopathy seen previously. Evaluation of the hilar adenopathy limited without IV contrast. 3. Stable findings of cirrhosis and splenomegaly. 4. Aortic Atherosclerosis (  ICD10-I70.0). Coronary artery atherosclerosis. Electronically Signed   By: Randa Ngo M.D.   On: 06/21/2022 18:41   DG Chest Port 1 View  Result Date: 06/20/2022 CLINICAL  DATA:  Status post bronchoscopy EXAM: PORTABLE CHEST 1 VIEW COMPARISON:  05/28/2022, 04/26/2022 FINDINGS: Single frontal view of the chest demonstrates a stable cardiac silhouette. Lung volumes are diminished. Innumerable bilateral pulmonary nodules are consistent with known metastatic disease. No new consolidation, effusion, or pneumothorax. No acute bony abnormalities. IMPRESSION: 1. Low lung volumes, with innumerable bilateral pulmonary nodules consistent with metastatic disease. 2. No complication after bronchoscopy. Electronically Signed   By: Randa Ngo M.D.   On: 06/20/2022 17:07   DG C-Arm 1-60 Min-No Report  Result Date: 06/20/2022 Fluoroscopy was utilized by the requesting physician.  No radiographic interpretation.   DG C-Arm 1-60 Min-No Report  Result Date: 06/20/2022 Fluoroscopy was utilized by the requesting physician.  No radiographic interpretation.   DG C-Arm 1-60 Min-No Report  Result Date: 06/20/2022 Fluoroscopy was utilized by the requesting physician.  No radiographic interpretation.     Assessment and plan- Patient is a 67 y.o. female with multiple bilateral lung nodules here to discuss further management  Lung nodules: Patient has bilateral lung nodules which are too numerous to count by imaging ranging from 1 to 2.7 cm which was reported to be concerning for metastatic disease.  Patient had bronchoscopy with multiple biopsies and all of them have been negative for malignancy.  We discussed her case at tumor board and one of the concerns brought up by pulmonary is that we could be dealing with potential vasculitis which can present this way.  I did get in touch with Dr. Lanney Gins who has subsequently seen the patient and has done additional vasculitis and infectious workup.  Patient tells me that he has also sent her information to [facility] for another opinion.  At this time in the absence of any biopsy-proven malignancy I am holding off on further workup at my  end as far as her lung nodules are concerned.  She may need another attempt at tissue diagnosis CT-guided biopsy versus bronchoscopy.  There is a concern that these lesions are potentially vascular and bronchoscopy may be a safer approach.  Tracer uptake in the tail of the pancreas: Etiology unclear. Dr. Cephas Darby from [facility] will also review her PET images next week and we will likely schedule her for EUS either here at [facility] or at [facility]  I do not think that her pancreatic lesions are related to her lung lesions but given that they had PET uptake we will get them looked at nevertheless.    Follow-up with me to be decided based on if and when patient gets EUS.    Visit Diagnosis 1. Lung nodules   2. Pancreatic lesion      Dr. Randa Evens, MD, MPH [facility] at [facility] [address] [id number] 07/02/2022 2:51 PM

## 2022-07-09 DIAGNOSIS — R918 Other nonspecific abnormal finding of lung field: Secondary | ICD-10-CM | POA: Diagnosis not present

## 2022-07-11 ENCOUNTER — Encounter: Admission: RE | Payer: Self-pay | Source: Home / Self Care

## 2022-07-11 ENCOUNTER — Ambulatory Visit: Admission: RE | Admit: 2022-07-11 | Payer: PPO | Source: Home / Self Care

## 2022-07-11 SURGERY — BRONCHOSCOPY, WITH EBUS
Anesthesia: General

## 2022-07-13 LAB — CULTURE, FUNGUS WITHOUT SMEAR

## 2022-07-16 ENCOUNTER — Telehealth: Payer: Self-pay

## 2022-07-16 ENCOUNTER — Other Ambulatory Visit: Payer: Self-pay

## 2022-07-16 NOTE — Telephone Encounter (Signed)
EUS scheduled for July 26, 2022 with Dr. Cephas Darby. Spoke with Ms. Nieland. Denies anticoagulants and diabetes. Copy of instructions sent to My Chart for review. No further questions at this time. Appointment arranged with Dr. Janese Banks for results.

## 2022-07-16 NOTE — Telephone Encounter (Signed)
Spoke with Ms. Frechette. Educated further on EUS and reviewed instructions she received. All questions answered. She will follow up with Dr. Janese Banks 4/1 to review EUS results.

## 2022-07-19 DIAGNOSIS — E7800 Pure hypercholesterolemia, unspecified: Secondary | ICD-10-CM | POA: Diagnosis not present

## 2022-07-19 DIAGNOSIS — R918 Other nonspecific abnormal finding of lung field: Secondary | ICD-10-CM | POA: Diagnosis not present

## 2022-07-19 DIAGNOSIS — D509 Iron deficiency anemia, unspecified: Secondary | ICD-10-CM | POA: Diagnosis not present

## 2022-07-19 DIAGNOSIS — Z90710 Acquired absence of both cervix and uterus: Secondary | ICD-10-CM | POA: Diagnosis not present

## 2022-07-19 DIAGNOSIS — I10 Essential (primary) hypertension: Secondary | ICD-10-CM | POA: Diagnosis not present

## 2022-07-19 DIAGNOSIS — K869 Disease of pancreas, unspecified: Secondary | ICD-10-CM | POA: Diagnosis not present

## 2022-07-19 DIAGNOSIS — G4733 Obstructive sleep apnea (adult) (pediatric): Secondary | ICD-10-CM | POA: Diagnosis not present

## 2022-07-24 ENCOUNTER — Telehealth: Payer: Self-pay

## 2022-07-24 ENCOUNTER — Other Ambulatory Visit: Payer: Self-pay | Admitting: Pulmonary Disease

## 2022-07-24 DIAGNOSIS — R059 Cough, unspecified: Secondary | ICD-10-CM | POA: Diagnosis not present

## 2022-07-24 DIAGNOSIS — R918 Other nonspecific abnormal finding of lung field: Secondary | ICD-10-CM

## 2022-07-24 DIAGNOSIS — R051 Acute cough: Secondary | ICD-10-CM | POA: Diagnosis not present

## 2022-07-24 DIAGNOSIS — R0602 Shortness of breath: Secondary | ICD-10-CM | POA: Diagnosis not present

## 2022-07-24 NOTE — Telephone Encounter (Signed)
Sarah Marquez called to cancel her EUS scheduled for 07/26/22 due to a respiratory illness. She saw her pulmonologist today and she was advised to postpone being sedated until illness has cleared. We will reschedule her to 4/11, next available. Her follow up with Dr. Janese Banks will need to be rescheduled. Message sent to scheduling.

## 2022-07-30 ENCOUNTER — Ambulatory Visit: Payer: PPO | Admitting: Oncology

## 2022-08-01 DIAGNOSIS — R918 Other nonspecific abnormal finding of lung field: Secondary | ICD-10-CM | POA: Diagnosis not present

## 2022-08-01 DIAGNOSIS — R053 Chronic cough: Secondary | ICD-10-CM | POA: Diagnosis not present

## 2022-08-02 ENCOUNTER — Encounter: Payer: Self-pay | Admitting: Internal Medicine

## 2022-08-05 LAB — ACID FAST CULTURE WITH REFLEXED SENSITIVITIES (MYCOBACTERIA): Acid Fast Culture: NEGATIVE

## 2022-08-08 ENCOUNTER — Encounter: Payer: Self-pay | Admitting: Internal Medicine

## 2022-08-09 ENCOUNTER — Encounter: Payer: Self-pay | Admitting: Internal Medicine

## 2022-08-09 ENCOUNTER — Encounter
Admission: RE | Disposition: A | Discharge status: Home / Self Care | Payer: Self-pay | Source: Home / Self Care | Attending: Internal Medicine

## 2022-08-09 ENCOUNTER — Ambulatory Visit: Payer: PPO | Admitting: Anesthesiology

## 2022-08-09 ENCOUNTER — Ambulatory Visit
Admission: RE | Admit: 2022-08-09 | Discharge: 2022-08-09 | Disposition: A | Discharge status: Home / Self Care | Payer: PPO | Attending: Internal Medicine | Admitting: Internal Medicine

## 2022-08-09 DIAGNOSIS — K317 Polyp of stomach and duodenum: Secondary | ICD-10-CM | POA: Insufficient documentation

## 2022-08-09 DIAGNOSIS — R935 Abnormal findings on diagnostic imaging of other abdominal regions, including retroperitoneum: Secondary | ICD-10-CM | POA: Insufficient documentation

## 2022-08-09 DIAGNOSIS — I10 Essential (primary) hypertension: Secondary | ICD-10-CM | POA: Diagnosis not present

## 2022-08-09 DIAGNOSIS — K8689 Other specified diseases of pancreas: Secondary | ICD-10-CM | POA: Diagnosis not present

## 2022-08-09 DIAGNOSIS — R591 Generalized enlarged lymph nodes: Secondary | ICD-10-CM | POA: Insufficient documentation

## 2022-08-09 HISTORY — PX: EUS: SHX5427

## 2022-08-09 SURGERY — UPPER ENDOSCOPIC ULTRASOUND (EUS) LINEAR
Anesthesia: General

## 2022-08-09 MED ORDER — SODIUM CHLORIDE 0.9 % IV SOLN
INTRAVENOUS | Status: DC
  Administered 2022-08-09: 1000 mL via INTRAVENOUS

## 2022-08-09 MED ORDER — LIDOCAINE HCL (PF) 2 % IJ SOLN
INTRAMUSCULAR | Status: AC
  Filled 2022-08-09: qty 5

## 2022-08-09 MED ORDER — PROPOFOL 10 MG/ML IV BOLUS
INTRAVENOUS | Status: AC
  Filled 2022-08-09: qty 40

## 2022-08-09 MED ORDER — LIDOCAINE HCL (CARDIAC) PF 100 MG/5ML IV SOSY
PREFILLED_SYRINGE | INTRAVENOUS | Status: DC | PRN
  Administered 2022-08-09: 50 mg via INTRAVENOUS

## 2022-08-09 MED ORDER — GLYCOPYRROLATE 0.2 MG/ML IJ SOLN
INTRAMUSCULAR | Status: DC | PRN
  Administered 2022-08-09: .1 mg via INTRAVENOUS

## 2022-08-09 MED ORDER — GLYCOPYRROLATE 0.2 MG/ML IJ SOLN
INTRAMUSCULAR | Status: AC
  Filled 2022-08-09: qty 1

## 2022-08-09 MED ORDER — PROPOFOL 10 MG/ML IV BOLUS
INTRAVENOUS | Status: DC | PRN
  Administered 2022-08-09: 50 mg via INTRAVENOUS
  Administered 2022-08-09: 100 mg via INTRAVENOUS

## 2022-08-09 MED ORDER — PROPOFOL 500 MG/50ML IV EMUL
INTRAVENOUS | Status: DC | PRN
  Administered 2022-08-09: 100 ug/kg/min via INTRAVENOUS

## 2022-08-09 NOTE — Anesthesia Preprocedure Evaluation (Signed)
Anesthesia Evaluation  Patient identified by MRN, date of birth, ID band Patient awake  General Assessment Comment:No documented airway complications on record  Reviewed: Allergy & Precautions, NPO status , Patient's Chart, lab work & pertinent test results  History of Anesthesia Complications (+) DIFFICULT AIRWAY and history of anesthetic complications  Airway Mallampati: III  TM Distance: <3 FB Neck ROM: full    Dental  (+) Missing, Dental Advidsory Given, Partial Upper   Pulmonary shortness of breath and with exertion, asthma , sleep apnea , neg recent URI   Pulmonary exam normal        Cardiovascular Exercise Tolerance: Good hypertension, (-) angina (-) Past MI and (-) Cardiac Stents Normal cardiovascular exam(-) dysrhythmias (-) Valvular Problems/Murmurs     Neuro/Psych  Neuromuscular disease  negative psych ROS   GI/Hepatic Neg liver ROS, PUD,GERD  Controlled,,  Endo/Other    Morbid obesity  Renal/GU      Musculoskeletal   Abdominal  (+) + obese  Peds  Hematology negative hematology ROS (+)   Anesthesia Other Findings Past Medical History: No date: Arthritis     Comment:  neck, knees No date: Asthma No date: Cancer of the skin, basal cell     Comment:  a.) s/p excision from neck, back, and foot No date: Carpal tunnel syndrome No date: Colon polyps No date: Difficult intubation No date: Gastric ulcer No date: GERD (gastroesophageal reflux disease) No date: Gout No date: Hyperlipidemia No date: Hypertension No date: Iron deficiency anemia No date: OSA (obstructive sleep apnea)     Comment:  a.) does not require nocturnal PAP therapy No date: Vertigo     Comment:  last episode 12/2018 No date: Wears dentures     Comment:  partial upper  Past Surgical History: No date: ABDOMINAL HYSTERECTOMY No date: APPENDECTOMY No date: BACK SURGERY     Comment:  x2 FOR RUPTURED DISC 1990's: CARDIAC  CATHETERIZATION 04/07/2019: CATARACT EXTRACTION W/PHACO; Left     Comment:  Procedure: CATARACT EXTRACTION PHACO AND INTRAOCULAR               LENS PLACEMENT (IOC) LEFT 4.43, 00:34.1;  Surgeon:               Galen Manila, MD;  Location: [facility];                Service: Ophthalmology;  Laterality: Left;  Sleep               Apnea-does not wear CPAP Latex-adhesives 05/05/2019: CATARACT EXTRACTION W/PHACO; Right     Comment:  Procedure: CATARACT EXTRACTION PHACO AND INTRAOCULAR               LENS PLACEMENT (IOC) RIGHT;  4.83, 00:33.4;  Surgeon:               Galen Manila, MD;  Location: [facility];                Service: Ophthalmology;  Laterality: Right;  SLEP               APNEA-CPAP No date: CHOLECYSTECTOMY No date: COLONOSCOPY 10/16/2017: COLONOSCOPY WITH PROPOFOL; N/A     Comment:  Procedure: COLONOSCOPY WITH PROPOFOL;  Surgeon: Scot Jun, MD;  Location: [facility] ENDOSCOPY;  Service:               Endoscopy;  Laterality: N/A; No date: ESOPHAGOGASTRODUODENOSCOPY 10/16/2017: ESOPHAGOGASTRODUODENOSCOPY (  EGD) WITH PROPOFOL; N/A     Comment:  Procedure: ESOPHAGOGASTRODUODENOSCOPY (EGD) WITH               PROPOFOL;  Surgeon: Scot Jun, MD;  Location[facility]               [facility] END[facility];  Service: Endoscopy;  Laterality: N/A; No date: FUNCTIONAL ENDOSCOPIC SINUS SURGERY No date: knee arthoscopy No date: LUMBAR DISC SURGERY     Comment:  ruptured disc x 2 No date: TONSILLECTOMY AND ADENOIDECTOMY 10/25/2020: TOTAL KNEE ARTHROPLASTY; Right     Comment:  Procedure: TOTAL KNEE ARTHROPLASTY - [place] Neighbor to               assist;  Surgeon: Kennedy Bucker, MD;  Location: [facility];              Service: Orthopedics;  Laterality: Right;  BMI    Body Mass Index: 43.22 kg/m      Reproductive/Obstetrics negative OB ROS                              Anesthesia Physical Anesthesia Plan  ASA: 3  Anesthesia Plan: General    Post-op Pain Management: Minimal or no pain anticipated   Induction: Intravenous  PONV Risk Score and Plan: 3 and Ondansetron, Treatment may vary due to age or medical condition, Propofol infusion and TIVA  Airway Management Planned: Natural Airway and Simple Face Mask  Additional Equipment: None  Intra-op Plan:   Post-operative Plan:   Informed Consent: I have reviewed the patients History and Physical, chart, labs and discussed the procedure including the risks, benefits and alternatives for the proposed anesthesia with the patient or authorized representative who has indicated his/her understanding and acceptance.     Dental Advisory Given  Plan Discussed with: Anesthesiologist, CRNA and Surgeon  Anesthesia Plan Comments: (Discussed risks of anesthesia with patient, including possibility of difficulty with spontaneous ventilation under anesthesia necessitating airway intervention, PONV, and rare risks such as cardiac or respiratory or neurological events, and allergic reactions. Discussed the role of CRNA in patient's perioperative care. Patient understands. Patient informed about increased incidence of above perioperative risk due to high BMI. Patient understands.  )         Anesthesia Quick Evaluation

## 2022-08-09 NOTE — H&P (Signed)
67 yo female who presents for an EUS for evaluation of abnormal PET scan finding in the pancreas and lymph node.  No contraindication for procedure.  Vital signs stable.     Allergies  Allergen Reactions       Albuterol        Nebulizer form, causes wheezing   Beclomethasone Swelling      Beconase Nose spray swelled face up Currently uses Qvar on occasion and it does not cause problems   Sulfa Antibiotics Hives   Latex Rash      Adhesives, bandaids Paper tape is okay   Meloxicam Palpitations            Past Medical History:  Diagnosis Date   Arthritis      neck, knees   Asthma     Calcification of right carotid artery     Cancer of the skin, basal cell      a.) s/p excision from neck, back, and foot   Carpal tunnel syndrome     Colon polyps     Difficult intubation     Dyspnea     Gastric ulcer     GERD (gastroesophageal reflux disease)     Gout     History of 2019 novel coronavirus disease (COVID-19) 10/21/2020   Hyperlipidemia     Hypertension     Iron deficiency     Iron deficiency anemia     OSA (obstructive sleep apnea)      a.) does not require nocturnal PAP therapy   Pulmonary nodule     Vertigo      last episode 12/2018   Wears dentures      partial upper             Past Surgical History:  Procedure Laterality Date   ABDOMINAL HYSTERECTOMY       APPENDECTOMY       BACK SURGERY        x2 FOR RUPTURED DISC   CARDIAC CATHETERIZATION   1990's   CATARACT EXTRACTION W/PHACO Left 04/07/2019    Procedure: CATARACT EXTRACTION PHACO AND INTRAOCULAR LENS PLACEMENT (IOC) LEFT 4.43, 00:34.1;  Surgeon: Galen Manila, MD;  Location: [facility];  Service: Ophthalmology;  Laterality: Left;  Sleep Apnea-does not wear CPAP Latex-adhesives   CATARACT EXTRACTION W/PHACO Right 05/05/2019    Procedure: CATARACT EXTRACTION PHACO AND INTRAOCULAR LENS PLACEMENT (IOC) RIGHT;  4.83, 00:33.4;  Surgeon: Galen Manila, MD;  Location: [facility];   Service: Ophthalmology;  Laterality: Right;  SLEP APNEA-CPAP   CHOLECYSTECTOMY       COLONOSCOPY       COLONOSCOPY WITH PROPOFOL N/A 10/16/2017    Procedure: COLONOSCOPY WITH PROPOFOL;  Surgeon: Scot Jun, MD;  Location: [facility];  Service: Endoscopy;  Laterality: N/A;   ESOPHAGOGASTRODUODENOSCOPY       ESOPHAGOGASTRODUODENOSCOPY (EGD) WITH PROPOFOL N/A 10/16/2017    Procedure: ESOPHAGOGASTRODUODENOSCOPY (EGD) WITH PROPOFOL;  Surgeon: Scot Jun, MD;  Location: [facility];  Service: Endoscopy;  Laterality: N/A;   FUNCTIONAL ENDOSCOPIC SINUS SURGERY       knee arthoscopy       LUMBAR DISC SURGERY        ruptured disc x 2   TONSILLECTOMY AND ADENOIDECTOMY       TOTAL KNEE ARTHROPLASTY Right 10/25/2020    Procedure: TOTAL KNEE ARTHROPLASTY - [place] Neighbor to assist;  Surgeon: Kennedy Bucker, MD;  Location: [facility];  Service: Orthopedics;  Laterality: Right;   TOTAL KNEE ARTHROPLASTY Left  07/06/2021    Procedure: TOTAL KNEE ARTHROPLASTY;  Surgeon: Kennedy BuckerMenz, Michael, MD;  Location: [facility];  Service: Orthopedics;  Laterality: Left;   VIDEO BRONCHOSCOPY WITH ENDOBRONCHIAL ULTRASOUND N/A 06/20/2022    Procedure: VIDEO BRONCHOSCOPY WITH ENDOBRONCHIAL ULTRASOUND;  Surgeon: Vida RiggerAleskerov, Fuad, MD;  Location: [facility];  Service: Thoracic;  Laterality: N/A;      Social History         Socioeconomic History   Marital status: Married      Spouse name: Ethelene Brownsnthony   Number of children: Not on file   Years of education: Not on file   Highest education level: Not on file  Occupational History   Occupation: variety of office jobs      Comment: retired  Tobacco Use   Smoking status: Never   Smokeless tobacco: Never   Tobacco comments:      former social smoker  Building services engineerVaping Use   Vaping Use: Never used  Substance and Sexual Activity   Alcohol use: No      Alcohol/week: 0.0 standard drinks of alcohol   Drug use: No   Sexual activity: Yes      Birth control/protection: Post-menopausal   Other Topics Concern   Not on file  Social History Narrative    Patient lives with husband and an older son.    Patient feels safe in home. Son has Muscular dystrophy and they have a ramp.    Social Determinants of Health        Financial Resource Strain: Not on file  Food Insecurity: No Food Insecurity (06/04/2022)    Hunger Vital Sign     Worried About Running Out of Food in the Last Year: Never true     Ran Out of Food in the Last Year: Never true  Transportation Needs: No Transportation Needs (06/04/2022)    PRAPARE - Therapist, artTransportation     Lack of Transportation (Medical): No     Lack of Transportation (Non-Medical): No  Physical Activity: Inactive (12/20/2021)    Exercise Vital Sign     Days of Exercise per Week: 0 days     Minutes of Exercise per Session: 0 min  Stress: No Stress Concern Present (12/20/2021)    [employer] - Occupational Stress Questionnaire     Feeling of Stress : Not at all  Social Connections: Socially Integrated (12/20/2021)    Social Connection and Isolation Panel [NHANES]     Frequency of Communication with Friends and Family: More than three times a week     Frequency of Social Gatherings with Friends and Family: More than three times a week     Attends Religious Services: More than 4 times per year     Active Member of [employer] Financial[employer]ubs or Organizations: Yes     Attends Engineer, structuralClub or Organization Meetings: More than 4 times per year     Marital Status: Married  Catering managerntimate Partner Violence: Not At Risk (06/04/2022)    Humiliation, Afraid, Rape, and Kick questionnaire     Fear of Current or Ex-Partner: No     Emotionally Abused: No     Physically Abused: No     Sexually Abused: No           Family History  Problem Relation Age of Onset   Breast cancer Mother [id number]   Heart attack Father     Heart attack Brother          Current Outpatient Medications:    beclomethasone (QVAR) 80 MCG/ACT inhaler, Inhale  2 puffs into the lungs 2 (two) times  daily as needed (shortness of breath)., Disp: , Rfl:    etodolac (LODINE) 400 MG tablet, Take 400 mg by mouth 2 (two) times daily., Disp: , Rfl:    HYDROcodone bit-homatropine (HYCODAN) 5-1.5 MG/5ML syrup, Take 5 mLs by mouth 2 (two) times daily., Disp: 120 mL, Rfl: 0   HYDROcodone bit-homatropine (HYCODAN) 5-1.5 MG/5ML syrup, Take 5 mLs by mouth 2 (two) times daily as needed for cough., Disp: 120 mL, Rfl: 0   losartan (COZAAR) 50 MG tablet, Take 1 tablet (50 mg total) by mouth daily., Disp: 90 tablet, Rfl: 3   montelukast (SINGULAIR) 10 MG tablet, TAKE 1 TABLET (10 MG TOTAL) BY MOUTH DAILY., Disp: 30 tablet, Rfl: 2   Multiple Vitamins-Minerals (PRESERVISION AREDS 2 PO), Take 1 tablet by mouth in the morning and at bedtime., Disp: , Rfl:    pantoprazole (PROTONIX) 40 MG tablet, Take 1 tablet (40 mg total) by mouth 2 (two) times daily., Disp: 180 tablet, Rfl: 0   triamcinolone ointment (KENALOG) 0.1 %, Apply topically 2 (two) times daily as needed., Disp: , Rfl:    Vitamin D, Ergocalciferol, (DRISDOL) 1.25 MG (50000 UNIT) CAPS capsule, TAKE 1 CAPSULE (50,000 UNITS TOTAL) BY MOUTH EVERY 7 (SEVEN) DAYS. (TAKING ONE TABLET PER WEEK), Disp: 4 capsule, Rfl: 5

## 2022-08-09 NOTE — Anesthesia Postprocedure Evaluation (Signed)
Anesthesia Post Note  Patient: Sarah Marquez  Procedure(s) Performed: UPPER ENDOSCOPIC ULTRASOUND (EUS) LINEAR  Patient location during evaluation: Endoscopy Anesthesia Type: General Level of consciousness: awake and alert Pain management: pain level controlled Vital Signs Assessment: post-procedure vital signs reviewed and stable Respiratory status: spontaneous breathing, nonlabored ventilation, respiratory function stable and patient connected to nasal cannula oxygen Cardiovascular status: blood pressure returned to baseline and stable Postop Assessment: no apparent nausea or vomiting Anesthetic complications: no   No notable events documented.   Last Vitals:  Vitals:   08/09/22 1339 08/09/22 1349  BP: (!) 142/70 137/73  Pulse:    Resp: (!) 22   Temp:    SpO2:      Last Pain:  Vitals:   08/09/22 1349  TempSrc:   PainSc: 0-No pain                 Corinda Gubler

## 2022-08-09 NOTE — Transfer of Care (Signed)
Immediate Anesthesia Transfer of Care Note  Patient: SKYLEE HELMKE  Procedure(s) Performed: UPPER ENDOSCOPIC ULTRASOUND (EUS) LINEAR  Patient Location: Endoscopy Unit  Anesthesia Type:General  Level of Consciousness: awake, alert , and oriented  Airway & Oxygen Therapy: Patient Spontanous Breathing  Post-op Assessment: Report given to RN and Post -op Vital signs reviewed and stable  Post vital signs: Reviewed and stable  Last Vitals:  Vitals Value Taken Time  BP 144/74 08/09/22 1329  Temp    Pulse 86 08/09/22 1329  Resp 33 08/09/22 1329  SpO2 98 % 08/09/22 1329  Vitals shown include unvalidated device data.  Last Pain:  Vitals:   08/09/22 1237  TempSrc: Temporal  PainSc: 0-No pain         Complications: No notable events documented.

## 2022-08-09 NOTE — Op Note (Signed)
[facility] Gastroenterology Patient Name: Sarah ChromanSusan Marquez Procedure Date: 08/09/2022 12:17 PM MRN: [id number] Account #: [id number] Date of Birth: 1956/04/18 Admit Type: Outpatient Age: 67 [facility] ROOM 3 Gender: Female Note Status: Finalized Instrument Name: Laurette SchimkeUpper-Endoscope [id number],Linear EUS Scope [id number] Procedure:             Upper EUS Indications:           Abnormal abdominal PET scan: lymphadenopathy and                         questionable abnormality in the pancreas tail Patient Profile:       Refer to note in patient chart for documentation of                         history and physical. Providers:             Prudencio Pairebecca A. Audyn Dimercurio Referring MD:          Ferdinand Langoichard L. Sullivan LoneGilbert, MD (Referring MD), Owens SharkArchana Rao                         (Referring MD) Medicines:             Propofol per Anesthesia Complications:         No immediate complications. Procedure:             Pre-Anesthesia Assessment:                        Prior to the procedure, a History and Physical was                         performed, and patient medications and allergies were                         reviewed. The patient is competent. The risks and                         benefits of the procedure and the sedation options and                         risks were discussed with the patient. All questions                         were answered and informed consent was obtained.                         Patient identification and proposed procedure were                         verified by the physician, the nurse and the                         anesthesiologist in the pre-procedure area. Mental                         Status Examination: alert and oriented. Airway                         Examination: normal oropharyngeal airway and neck  mobility. Respiratory Examination: clear to                         auscultation. CV Examination: normal. Prophylactic                          Antibiotics: The patient does not require prophylactic                         antibiotics. Prior Anticoagulants: The patient has                         taken no anticoagulant or antiplatelet agents. ASA                         Grade Assessment: III - A patient with severe systemic                         disease. After reviewing the risks and benefits, the                         patient was deemed in satisfactory condition to                         undergo the procedure. The anesthesia plan was to use                         monitored anesthesia care (MAC). Immediately prior to                         administration of medications, the patient was                         re-assessed for adequacy to receive sedatives. The                         heart rate, respiratory rate, oxygen saturations,                         blood pressure, adequacy of pulmonary ventilation, and                         response to care were monitored throughout the                         procedure. The physical status of the patient was                         re-assessed after the procedure.                        After obtaining informed consent, the endoscope was                         passed under direct vision. Throughout the procedure,                         the patient's blood pressure, pulse, and oxygen  saturations were monitored continuously. The Endoscope                         was introduced through the mouth, and advanced to the                         second part of duodenum. The Endoscope was introduced                         through the mouth, and advanced to the duodenum for                         ultrasound examination from the esophagus, stomach and                         duodenum. The upper EUS was accomplished without                         difficulty. The patient tolerated the procedure well. Findings:      ENDOSCOPIC FINDING: :      The examined  esophagus was endoscopically normal.      A few small sessile polyps were found in the gastric fundus and body.      The examined duodenum was endoscopically normal.      ENDOSONOGRAPHIC FINDING: :      There was no sign of significant endosonographic parenchymal or ductal       abnormality in the pancreatic head, genu of the pancreas, pancreatic       body and pancreatic tail. The PD measured 1.0 mm in the head, 2.0 mm in       the neck, 0.8 mm in the body, and 0.9 mm in the tail.      There was no sign of significant endosonographic abnormality in the       common bile duct (1.3 mm) and in the common hepatic duct (6.1 mm).      Two benign-appearing lymph nodes were visualized in the peripancreatic       head region. The largest measured 10 mm by 8 mm in maximal       cross-sectional diameter. The nodes were oval, isoechoic and had well       defined margins. Fine needle biopsy was performed. Color Doppler imaging       was utilized prior to needle puncture to confirm a lack of significant       vascular structures within the needle path. Three passes were made with       the 22 gauge SharkCore biopsy needle using a transduodenal approach. A       visible core of tissue was obtained. The cellularity of the specimen was       adequate. Final cytology results are pending.      Endosonographic imaging in the left lobe of the liver showed no       abnormalities.      The celiac region was visualized and showed no sign of significant       endosonographic abnormality. Impression:            EGD Impressions:                        - Normal esophagus.                        -  A few gastric polyps.                        - Normal examined duodenum.                        EUS Impressions:                        - There was no sign of significant pathology in the                         pancreatic head, genu of the pancreas, pancreatic body                         and pancreatic tail. The  pancreas tail was normal with                         no visualized abnormalities.                        - There was no sign of significant pathology in the                         common bile duct and in the common hepatic duct.                        - Two benign appearing lymph nodes were visualized in                         the peripancreatic head region. The endosonographic                         appearance is suggestive of benign inflammatory                         changes. Fine needle biopsy performed for cytology and                         flow cytometry given the clinical history.                        - Normal visualized portions of the liver.                        - Normal celiac region. Recommendation:        - Discharge patient to home (ambulatory).                        - Await cytology results.                        - The findings and recommendations were discussed with                         the patient.                        - Return to referring physician as previously  scheduled. Further plan of care to be determined by                         the referring physicians. Procedure Code(s):     --- Professional ---                        [phone], Esophagogastroduodenoscopy, flexible,                         transoral; with transendoscopic ultrasound-guided                         intramural or transmural fine needle                         aspiration/biopsy(s), (includes endoscopic ultrasound                         examination limited to the esophagus, stomach or                         duodenum, and adjacent structures) Diagnosis Code(s):     --- Professional ---                        R93.5, Abnormal findings on diagnostic imaging of                         other abdominal regions, including retroperitoneum                        I89.9, Noninfective disorder of lymphatic vessels and                         lymph nodes, unspecified                         K31.7, Polyp of stomach and duodenum CPT copyright 2022 American Medical Association. All rights reserved. The codes documented in this report are preliminary and upon coder review may  be revised to meet current compliance requirements. Attending Participation:      I personally performed the entire procedure without the assistance of a       fellow, resident or surgical assistant. Prudencio Pair Atha Mcbain,  08/09/2022 1:32:00 PM This report has been signed electronically. Number of Addenda: 0 Note Initiated On: 08/09/2022 12:17 PM Estimated Blood Loss:  Estimated blood loss: none.      [facility]

## 2022-08-09 NOTE — Discharge Instructions (Signed)
Discharge to home °

## 2022-08-10 ENCOUNTER — Encounter: Payer: Self-pay | Admitting: Internal Medicine

## 2022-08-10 ENCOUNTER — Other Ambulatory Visit: Payer: Self-pay | Admitting: Specialist

## 2022-08-10 LAB — CYTOLOGY - NON PAP

## 2022-08-13 ENCOUNTER — Encounter: Payer: Self-pay | Admitting: Oncology

## 2022-08-14 ENCOUNTER — Encounter: Payer: Self-pay | Admitting: Oncology

## 2022-08-14 ENCOUNTER — Ambulatory Visit: Payer: PPO | Admitting: Oncology

## 2022-08-14 ENCOUNTER — Inpatient Hospital Stay: Payer: PPO | Attending: Oncology | Admitting: Oncology

## 2022-08-14 VITALS — BP 116/45 | HR 65 | Temp 96.9°F | Resp 18 | Ht 65.0 in | Wt 244.0 lb

## 2022-08-14 DIAGNOSIS — R948 Abnormal results of function studies of other organs and systems: Secondary | ICD-10-CM | POA: Insufficient documentation

## 2022-08-14 DIAGNOSIS — Z87891 Personal history of nicotine dependence: Secondary | ICD-10-CM | POA: Diagnosis not present

## 2022-08-14 DIAGNOSIS — R918 Other nonspecific abnormal finding of lung field: Secondary | ICD-10-CM | POA: Diagnosis not present

## 2022-08-14 DIAGNOSIS — Z803 Family history of malignant neoplasm of breast: Secondary | ICD-10-CM | POA: Insufficient documentation

## 2022-08-15 NOTE — Progress Notes (Signed)
Hematology/Oncology Consult note [facility]  [phone] [phone]  Patient Care Team: Bosie Clos, MD as PCP - General (Family Medicine) Kemper Durie, RN as [facility] [place], [place], [place] as Oncology Nurse Navigator   Name of the patient: Sarah Marquez  [id number]  [birth year]   Date of visit: 08/15/22  Diagnosis- 1.  Bilateral lung nodules of unclear etiology 2.  nonspecific pancreatic uptake noted on PET scan  Chief complaint/ Reason for visit- discuss eus results  Heme/Onc history: Arville Lime is a 67 year old female with a remote history of smoking in her teenage years.  She was seen by ENT for symptoms of persistent cough.  This was followed by a chest x-ray in December 2023 which showed patchy opacities in the bilateral lungs concerning for multifocal pneumonia.  This was followed by a CT chest with contrast in January 2024 which showed numerous bilateral lung nodules with the largest 1 in the left upper lobe measuring 2.5 x 1.9 cm.  No pleural effusion or pneumothorax.  Enlarged mediastinal and hilar lymph nodes measuring up to 1.3 cm.  Findings were concerning for lung cancer and a PET CT scan was recommended.  Patient was seen by pulmonary Dr.Aleskerov and underwent a PET CT scan on 05/28/2022 which showed multiple lung nodules too numerous to count ranging from 1 cm to 2.7 cm which were hypermetabolic on the PET scan.  Tiny left supraclavicular lymph node measuring 0.6 cm with an SUV of 2.18.  Left hilar adenopathy as well as right paratracheal adenopathy which could be nonspecific versus metastatic.  PET CT scan findings consistent with cirrhosis and splenomegaly.  Tracer uptake in the tail of the pancreas of indeterminate etiology.   Patient had bronchoscopy with multiple biopsies in February 2024 which were all negative for malignancy  Patient had a EUS to evaluate the nonspecific pancreatic tail uptake  noted on the PET scan.  There is no endosonographic parenchymal or ductal abnormality in the pancreatic head body or tail of the pancreas.  2 benign appearing lymph nodes in the [facility] pancreatic head region which were biopsied and were negative for malignancy.  Flow cytometry negative for lymphoma.  No endosonographic abnormality noted in the CBD or common hepatic duct.  Interval history-patient states that her cough is overall improved.  She is following up with Dr.Aleskerov and has a repeat CT chest coming up.  ECOG PS- 1 Pain scale- 0   Review of systems- Review of Systems  Constitutional:  Negative for chills, fever, malaise/fatigue and weight loss.  HENT:  Negative for congestion, ear discharge and nosebleeds.   Eyes:  Negative for blurred vision.  Respiratory:  Negative for cough, hemoptysis, sputum production, shortness of breath and wheezing.   Cardiovascular:  Negative for chest pain, palpitations, orthopnea and claudication.  Gastrointestinal:  Negative for abdominal pain, blood in stool, constipation, diarrhea, heartburn, melena, nausea and vomiting.  Genitourinary:  Negative for dysuria, flank pain, frequency, hematuria and urgency.  Musculoskeletal:  Negative for back pain, joint pain and myalgias.  Skin:  Negative for rash.  Neurological:  Negative for dizziness, tingling, focal weakness, seizures, weakness and headaches.  Endo/Heme/Allergies:  Does not bruise/bleed easily.  Psychiatric/Behavioral:  Negative for depression and suicidal ideas. The patient does not have insomnia.       Allergies  Allergen Reactions   Albuterol     Nebulizer form, causes wheezing   Beclomethasone Swelling    Beconase Nose spray swelled face up  Currently uses Qvar on occasion and it does not cause problems   Sulfa Antibiotics Hives   Latex Rash    Adhesives, bandaids Paper tape is okay   Meloxicam Palpitations     Past Medical History:  Diagnosis Date   Arthritis    neck, knees    Asthma    Calcification of right carotid artery    Cancer of the skin, basal cell    a.) s/p excision from neck, back, and foot   Carpal tunnel syndrome    Colon polyps    Difficult intubation    Dyspnea    Gastric ulcer    GERD (gastroesophageal reflux disease)    Gout    History of 2019 novel coronavirus disease (COVID-19) 10/21/2020   Hyperlipidemia    Hypertension    Iron deficiency    Iron deficiency anemia    OSA (obstructive sleep apnea)    a.) does not require nocturnal PAP therapy   Pulmonary nodule    Vertigo    last episode 12/2018   Wears dentures    partial upper     Past Surgical History:  Procedure Laterality Date   ABDOMINAL HYSTERECTOMY     APPENDECTOMY     BACK SURGERY     x2 FOR RUPTURED DISC   CARDIAC CATHETERIZATION  1990's   CATARACT EXTRACTION W/PHACO Left 04/07/2019   Procedure: CATARACT EXTRACTION PHACO AND INTRAOCULAR LENS PLACEMENT (IOC) LEFT 4.43, 00:34.1;  Surgeon: Galen Manila, MD;  Location: [facility];  Service: Ophthalmology;  Laterality: Left;  Sleep Apnea-does not wear CPAP Latex-adhesives   CATARACT EXTRACTION W/PHACO Right 05/05/2019   Procedure: CATARACT EXTRACTION PHACO AND INTRAOCULAR LENS PLACEMENT (IOC) RIGHT;  4.83, 00:33.4;  Surgeon: Galen Manila, MD;  Location: [facility];  Service: Ophthalmology;  Laterality: Right;  SLEP APNEA-CPAP   CHOLECYSTECTOMY     COLONOSCOPY     COLONOSCOPY WITH PROPOFOL N/A 10/16/2017   Procedure: COLONOSCOPY WITH PROPOFOL;  Surgeon: Scot Jun, MD;  Location: [facility];  Service: Endoscopy;  Laterality: N/A;   ESOPHAGOGASTRODUODENOSCOPY     ESOPHAGOGASTRODUODENOSCOPY (EGD) WITH PROPOFOL N/A 10/16/2017   Procedure: ESOPHAGOGASTRODUODENOSCOPY (EGD) WITH PROPOFOL;  Surgeon: Scot Jun, MD;  Location: [facility];  Service: Endoscopy;  Laterality: N/A;   EUS N/A 08/09/2022   Procedure: UPPER ENDOSCOPIC ULTRASOUND (EUS) LINEAR;  Surgeon: Bearl Mulberry, MD;  Location[facility];  Service: Gastroenterology;  Laterality: N/A;   EYE SURGERY     FUNCTIONAL ENDOSCOPIC SINUS SURGERY     JOINT REPLACEMENT     knee arthoscopy     LUMBAR DISC SURGERY     ruptured disc x 2   TONSILLECTOMY AND ADENOIDECTOMY     TOTAL KNEE ARTHROPLASTY Right 10/25/2020   Procedure: TOTAL KNEE ARTHROPLASTY - [place] Neighbor to assist;  Surgeon: Kennedy Bucker, MD;  Location: [facility];  Service: Orthopedics;  Laterality: Right;   TOTAL KNEE ARTHROPLASTY Left 07/06/2021   Procedure: TOTAL KNEE ARTHROPLASTY;  Surgeon: Kennedy Bucker, MD;  Location: [facility];  Service: Orthopedics;  Laterality: Left;   VIDEO BRONCHOSCOPY WITH ENDOBRONCHIAL ULTRASOUND N/A 06/20/2022   Procedure: VIDEO BRONCHOSCOPY WITH ENDOBRONCHIAL ULTRASOUND;  Surgeon: Vida Rigger, MD;  Location: [facility];  Service: Thoracic;  Laterality: N/A;    Social History   Socioeconomic History   Marital status: Married    Spouse name: Ethelene Browns   Number of children: Not on file   Years of education: Not on file   Highest education level: Not on file  Occupational History   Occupation: variety of office jobs    Comment: retired  Tobacco Use   Smoking status: Never   Smokeless tobacco: Never   Tobacco comments:    former social smoker  Building services engineer Use: Never used  Substance and Sexual Activity   Alcohol use: No    Alcohol/week: 0.0 standard drinks of alcohol   Drug use: No   Sexual activity: Yes    Birth control/protection: Post-menopausal  Other Topics Concern   Not on file  Social History Narrative   Patient lives with husband and an older son.   Patient feels safe in home. Son has Muscular dystrophy and they have a ramp.   Social Determinants of Health   Financial Resource Strain: Not on file  Food Insecurity: No Food Insecurity (06/04/2022)   Hunger Vital Sign    Worried About Running Out of Food in the Last Year: Never true    Ran Out of Food in the Last Year: Never true   Transportation Needs: No Transportation Needs (06/04/2022)   PRAPARE - Administrator, Civil Service (Medical): No    Lack of Transportation (Non-Medical): No  Physical Activity: Inactive (12/20/2021)   Exercise Vital Sign    Days of Exercise per Week: 0 days    Minutes of Exercise per Session: 0 min  Stress: No Stress Concern Present (12/20/2021)   [employer] of Occupational Health - Occupational Stress Questionnaire    Feeling of Stress : Not at all  Social Connections: Socially Integrated (12/20/2021)   Social Connection and Isolation Panel [NHANES]    Frequency of Communication with Friends and Family: More than three times a week    Frequency of Social Gatherings with Friends and Family: More than three times a week    Attends Religious Services: More than 4 times per year    Active Member of [employer] or Organizations: Yes    Attends Engineer, structural: More than 4 times per year    Marital Status: Married  Catering manager Violence: Not At Risk (06/04/2022)   Humiliation, Afraid, Rape, and Kick questionnaire    Fear of Current or Ex-Partner: No    Emotionally Abused: No    Physically Abused: No    Sexually Abused: No    Family History  Problem Relation Age of Onset   Breast cancer Mother 81   Heart attack Father    Heart attack Brother      Current Outpatient Medications:    albuterol (VENTOLIN HFA) 108 (90 Base) MCG/ACT inhaler, SMARTSIG:2 Puff(s) Via Inhaler Every 6-8 Hours PRN, Disp: , Rfl:    beclomethasone (QVAR) 80 MCG/ACT inhaler, Inhale 2 puffs into the lungs 2 (two) times daily as needed (shortness of breath)., Disp: , Rfl:    benzonatate (TESSALON) 200 MG capsule, Take by mouth., Disp: , Rfl:    etodolac (LODINE) 400 MG tablet, Take 400 mg by mouth 2 (two) times daily., Disp: , Rfl:    HYDROcodone bit-homatropine (HYCODAN) 5-1.5 MG/5ML syrup, Take 5 mLs by mouth 2 (two) times daily., Disp: 120 mL, Rfl: 0   HYDROcodone bit-homatropine  (HYCODAN) 5-1.5 MG/5ML syrup, Take 5 mLs by mouth 2 (two) times daily as needed for cough., Disp: 120 mL, Rfl: 0   HYDROcodone bit-homatropine (HYCODAN) 5-1.5 MG/5ML syrup, Take by mouth., Disp: , Rfl:    losartan (COZAAR) 50 MG tablet, Take 1 tablet (50 mg total) by mouth daily., Disp: 90 tablet, Rfl: 3   methylPREDNISolone (  MEDROL DOSEPAK) 4 MG TBPK tablet, TAKE 6 TABLETS ON DAY 1 AS DIRECTED ON PACKAGE AND DECREASE BY 1 TAB EACH DAY FOR A TOTAL OF 6 DAYS, Disp: , Rfl:    montelukast (SINGULAIR) 10 MG tablet, TAKE 1 TABLET (10 MG TOTAL) BY MOUTH DAILY., Disp: 30 tablet, Rfl: 2   Multiple Vitamins-Minerals (PRESERVISION AREDS 2 PO), Take 1 tablet by mouth in the morning and at bedtime., Disp: , Rfl:    pantoprazole (PROTONIX) 40 MG tablet, Take 1 tablet (40 mg total) by mouth 2 (two) times daily., Disp: 180 tablet, Rfl: 0   pantoprazole (PROTONIX) 40 MG tablet, Take by mouth., Disp: , Rfl:    predniSONE (DELTASONE) 20 MG tablet, TAKE 1 TABLET (20 MG) BY MOUTH ONCE DAILY FOR 40 DAYS TAKE 1 TAB DAILY FOR 30 DAYS THEN 1/2 TAB FOR 20 DAYS THEN STOP, Disp: , Rfl:    QVAR REDIHALER 80 MCG/ACT inhaler, Inhale into the lungs., Disp: , Rfl:    triamcinolone ointment (KENALOG) 0.1 %, Apply topically 2 (two) times daily as needed., Disp: , Rfl:    Vitamin D, Ergocalciferol, (DRISDOL) 1.25 MG (50000 UNIT) CAPS capsule, TAKE 1 CAPSULE (50,000 UNITS TOTAL) BY MOUTH EVERY 7 (SEVEN) DAYS. (TAKING ONE TABLET PER WEEK), Disp: 4 capsule, Rfl: 5  Physical exam:  Vitals:   08/14/22 1402  BP: (!) 116/45  Pulse: 65  Resp: 18  Temp: (!) 96.9 F (36.1 C)  TempSrc: Tympanic  SpO2: 100%  Weight: 244 lb (110.7 kg)  Height: 5\' 5"  (1.651 m)   Physical Exam Cardiovascular:     Rate and Rhythm: Normal rate and regular rhythm.     Heart sounds: Normal heart sounds.  Pulmonary:     Effort: Pulmonary effort is normal.     Breath sounds: Normal breath sounds.  Skin:    General: Skin is warm and dry.  Neurological:      Mental Status: She is alert and oriented to person, place, and time.         Latest Ref Rng & Units 06/04/2022    2:36 PM  CMP  Glucose 70 - 99 mg/dL 95   BUN 8 - 23 mg/dL 16   Creatinine 1.61 - 1.00 mg/dL 0.96   Sodium 045 - 409 mmol/L 136   Potassium 3.5 - 5.1 mmol/L 4.0   Chloride 98 - 111 mmol/L 103   CO2 22 - 32 mmol/L 25   Calcium 8.9 - 10.3 mg/dL 8.8   Total Protein 6.5 - 8.1 g/dL 6.9   Total Bilirubin 0.3 - 1.2 mg/dL 1.1   Alkaline Phos 38 - 126 U/L 84   AST 15 - 41 U/L 20   ALT 0 - 44 U/L 9       Latest Ref Rng & Units 06/18/2022    9:02 AM  CBC  WBC 4.0 - 10.5 K/uL 4.8   Hemoglobin 12.0 - 15.0 g/dL 81.1   Hematocrit 91.4 - 46.0 % 36.3   Platelets 150 - 400 K/uL 179     Assessment and plan- Patient is a 67 y.o. female who is here to discuss following issues  Abnormal PET scan uptake noted in the tail of the pancreas: This was followed by an EUS which did not show any endosonographic abnormalities of the pancreas of the CBD.  There were 2 benign-appearing lymph nodes which were biopsied and were negative for malignancy.  This does not require any further follow-up from my side.  2.  Bilateral lung  nodules: There are hypermetabolic and clearly looks abnormal on the PET scan.  Patient had a bronchoscopy in February 2024 which was negative for malignancy.  Unclear if you are dealing with malignancy versus a vasculitis process.  Patient has a repeat CT chest coming up this month and she will likely get a second bronchoscopy by [facility] following that.  I will defer further management of her lung nodules to pulmonary at this time.  No follow-up with me needed at this time but she can be referred to me in the future if there is any concern for malignancy based on bronchoscopy findings   Visit Diagnosis 1. Abnormal positron emission tomography (PET) scan      Dr. Owens Shark, MD, MPH [facility] [phone] 08/15/2022 8:30  AM

## 2022-08-20 DIAGNOSIS — M1712 Unilateral primary osteoarthritis, left knee: Secondary | ICD-10-CM | POA: Diagnosis not present

## 2022-08-20 DIAGNOSIS — Z96652 Presence of left artificial knee joint: Secondary | ICD-10-CM | POA: Diagnosis not present

## 2022-08-21 ENCOUNTER — Encounter
Admission: RE | Admit: 2022-08-21 | Discharge: 2022-08-21 | Disposition: A | Discharge status: Home / Self Care | Payer: PPO | Source: Ambulatory Visit | Attending: Pulmonary Disease | Admitting: Pulmonary Disease

## 2022-08-21 HISTORY — DX: Vitamin D deficiency, unspecified: E55.9

## 2022-08-21 NOTE — Patient Instructions (Addendum)
Your procedure is scheduled on: Wednesday, May 1 Report to the Registration Desk on the 1st floor of the [facility] To find out your arrival time, please call [phone] between 1PM - 3PM on: Tuesday, April 30 If your arrival time is 6:00 am, do not arrive before that time as the [facility] entrance doors do not open until 6:00 am.  REMEMBER: Instructions that are not followed completely may result in serious medical risk, up to and including death; or upon the discretion of your surgeon and anesthesiologist your surgery may need to be rescheduled.  Do not eat food after midnight the night before surgery.  No gum chewing or hard candies.  You may however, drink CLEAR liquids up to 2 hours before you are scheduled to arrive for your surgery. Do not drink anything within 2 hours of your scheduled arrival time.  Clear liquids include: - water  - apple juice without pulp - gatorade (not RED colors) - black coffee or tea (Do NOT add milk or creamers to the coffee or tea) Do NOT drink anything that is not on this list.  One week prior to surgery: starting April 24 Stop Anti-inflammatories (NSAIDS) such as Advil, Aleve, Ibuprofen, Motrin, Naproxen, Naprosyn and Aspirin based products such as Excedrin, Goody's Powder, BC Powder. Stop ANY OVER THE COUNTER supplements until after surgery. Stop preservision AREDS You may however, continue to take Tylenol if needed for pain up until the day of surgery.  Continue taking all prescribed medications   TAKE ONLY THESE MEDICATIONS THE MORNING OF SURGERY WITH A SIP OF WATER:  Qvar inhaler Pantoprazole (Protonix) - (take one the night before and one on the morning of surgery - helps to prevent nausea after surgery.) Prednisone if still taking  Use inhalers on the day of surgery and bring your albuterol inhaler to the hospital.  No Alcohol for 24 hours before or after surgery.  No Smoking including e-cigarettes for 24 hours before surgery.   No chewable tobacco products for at least 6 hours before surgery.  No nicotine patches on the day of surgery.  Do not use any "recreational" drugs for at least a week (preferably 2 weeks) before your surgery.  Please be advised that the combination of cocaine and anesthesia may have negative outcomes, up to and including death. If you test positive for cocaine, your surgery will be cancelled.  On the morning of surgery brush your teeth with toothpaste and water, you may rinse your mouth with mouthwash if you wish. Do not swallow any toothpaste or mouthwash.  Do not wear jewelry, make-up, hairpins, clips or nail polish.  Do not wear lotions, powders, or perfumes.   Contact lenses, hearing aids and dentures may not be worn into surgery.  Do not bring valuables to the hospital. [facility] is not responsible for any missing/lost belongings or valuables.   Notify your doctor if there is any change in your medical condition (cold, fever, infection).  Wear comfortable clothing (specific to your surgery type) to the hospital.  After surgery, you can help prevent lung complications by doing breathing exercises.  Take deep breaths and cough every 1-2 hours. Your doctor may order a device called an Incentive Spirometer to help you take deep breaths.  If you are being discharged the day of surgery, you will not be allowed to drive home. You will need a responsible individual to drive you home and stay with you for 24 hours after surgery.   If you are  taking public transportation, you will need to have a responsible individual with you.  Please call the Pre-admissions Testing Dept. at [phone] if you have any questions about these instructions.  Surgery Visitation Policy:  Patients having surgery or a procedure may have two visitors.  Children under the age of 35 must have an adult with them who is not the patient.

## 2022-08-23 ENCOUNTER — Ambulatory Visit
Admission: RE | Admit: 2022-08-23 | Discharge: 2022-08-23 | Disposition: A | Discharge status: Home / Self Care | Payer: PPO | Source: Ambulatory Visit | Attending: Pulmonary Disease | Admitting: Pulmonary Disease

## 2022-08-23 DIAGNOSIS — R918 Other nonspecific abnormal finding of lung field: Secondary | ICD-10-CM

## 2022-08-24 DIAGNOSIS — R918 Other nonspecific abnormal finding of lung field: Secondary | ICD-10-CM | POA: Diagnosis not present

## 2022-08-27 ENCOUNTER — Encounter
Admission: RE | Admit: 2022-08-27 | Discharge: 2022-08-27 | Disposition: A | Discharge status: Home / Self Care | Payer: PPO | Source: Ambulatory Visit | Attending: Pulmonary Disease | Admitting: Pulmonary Disease

## 2022-08-27 DIAGNOSIS — J939 Pneumothorax, unspecified: Secondary | ICD-10-CM | POA: Diagnosis not present

## 2022-08-27 DIAGNOSIS — J90 Pleural effusion, not elsewhere classified: Secondary | ICD-10-CM | POA: Diagnosis not present

## 2022-08-27 DIAGNOSIS — Z87891 Personal history of nicotine dependence: Secondary | ICD-10-CM | POA: Diagnosis not present

## 2022-08-27 DIAGNOSIS — Z7951 Long term (current) use of inhaled steroids: Secondary | ICD-10-CM | POA: Diagnosis not present

## 2022-08-27 DIAGNOSIS — Z01812 Encounter for preprocedural laboratory examination: Secondary | ICD-10-CM | POA: Insufficient documentation

## 2022-08-27 DIAGNOSIS — J9811 Atelectasis: Secondary | ICD-10-CM | POA: Diagnosis not present

## 2022-08-27 DIAGNOSIS — M109 Gout, unspecified: Secondary | ICD-10-CM | POA: Diagnosis present

## 2022-08-27 DIAGNOSIS — Z8249 Family history of ischemic heart disease and other diseases of the circulatory system: Secondary | ICD-10-CM | POA: Diagnosis not present

## 2022-08-27 DIAGNOSIS — E559 Vitamin D deficiency, unspecified: Secondary | ICD-10-CM | POA: Diagnosis present

## 2022-08-27 DIAGNOSIS — D509 Iron deficiency anemia, unspecified: Secondary | ICD-10-CM | POA: Diagnosis present

## 2022-08-27 DIAGNOSIS — Z1152 Encounter for screening for COVID-19: Secondary | ICD-10-CM | POA: Diagnosis not present

## 2022-08-27 DIAGNOSIS — Y848 Other medical procedures as the cause of abnormal reaction of the patient, or of later complication, without mention of misadventure at the time of the procedure: Secondary | ICD-10-CM | POA: Diagnosis present

## 2022-08-27 DIAGNOSIS — Z85828 Personal history of other malignant neoplasm of skin: Secondary | ICD-10-CM | POA: Diagnosis not present

## 2022-08-27 DIAGNOSIS — R918 Other nonspecific abnormal finding of lung field: Secondary | ICD-10-CM | POA: Diagnosis present

## 2022-08-27 DIAGNOSIS — Z8616 Personal history of COVID-19: Secondary | ICD-10-CM | POA: Diagnosis not present

## 2022-08-27 DIAGNOSIS — J8410 Pulmonary fibrosis, unspecified: Secondary | ICD-10-CM | POA: Diagnosis present

## 2022-08-27 DIAGNOSIS — G4733 Obstructive sleep apnea (adult) (pediatric): Secondary | ICD-10-CM | POA: Diagnosis present

## 2022-08-27 DIAGNOSIS — R911 Solitary pulmonary nodule: Secondary | ICD-10-CM | POA: Diagnosis not present

## 2022-08-27 DIAGNOSIS — Z96653 Presence of artificial knee joint, bilateral: Secondary | ICD-10-CM | POA: Diagnosis present

## 2022-08-27 DIAGNOSIS — J8489 Other specified interstitial pulmonary diseases: Secondary | ICD-10-CM | POA: Diagnosis present

## 2022-08-27 DIAGNOSIS — J811 Chronic pulmonary edema: Secondary | ICD-10-CM | POA: Diagnosis not present

## 2022-08-27 DIAGNOSIS — K219 Gastro-esophageal reflux disease without esophagitis: Secondary | ICD-10-CM | POA: Diagnosis present

## 2022-08-27 DIAGNOSIS — I10 Essential (primary) hypertension: Secondary | ICD-10-CM | POA: Diagnosis present

## 2022-08-27 DIAGNOSIS — Z6839 Body mass index (BMI) 39.0-39.9, adult: Secondary | ICD-10-CM | POA: Diagnosis not present

## 2022-08-27 DIAGNOSIS — M47812 Spondylosis without myelopathy or radiculopathy, cervical region: Secondary | ICD-10-CM | POA: Diagnosis present

## 2022-08-27 DIAGNOSIS — D381 Neoplasm of uncertain behavior of trachea, bronchus and lung: Secondary | ICD-10-CM | POA: Diagnosis not present

## 2022-08-27 DIAGNOSIS — I2510 Atherosclerotic heart disease of native coronary artery without angina pectoris: Secondary | ICD-10-CM | POA: Diagnosis present

## 2022-08-27 DIAGNOSIS — T17890A Other foreign object in other parts of respiratory tract causing asphyxiation, initial encounter: Secondary | ICD-10-CM | POA: Diagnosis present

## 2022-08-27 DIAGNOSIS — Z803 Family history of malignant neoplasm of breast: Secondary | ICD-10-CM | POA: Diagnosis not present

## 2022-08-27 DIAGNOSIS — J4540 Moderate persistent asthma, uncomplicated: Secondary | ICD-10-CM | POA: Diagnosis present

## 2022-08-27 DIAGNOSIS — J95811 Postprocedural pneumothorax: Secondary | ICD-10-CM | POA: Diagnosis present

## 2022-08-27 DIAGNOSIS — E785 Hyperlipidemia, unspecified: Secondary | ICD-10-CM | POA: Diagnosis present

## 2022-08-28 ENCOUNTER — Other Ambulatory Visit: Payer: PPO

## 2022-08-28 LAB — SARS CORONAVIRUS 2 (TAT 6-24 HRS): SARS Coronavirus 2: NEGATIVE

## 2022-08-29 ENCOUNTER — Ambulatory Visit: Payer: PPO | Admitting: Anesthesiology

## 2022-08-29 ENCOUNTER — Ambulatory Visit: Payer: PPO

## 2022-08-29 ENCOUNTER — Other Ambulatory Visit: Payer: Self-pay

## 2022-08-29 ENCOUNTER — Encounter
Admission: RE | Disposition: A | Discharge status: Home / Self Care | Payer: Self-pay | Source: Home / Self Care | Attending: Osteopathic Medicine

## 2022-08-29 ENCOUNTER — Inpatient Hospital Stay
Admission: RE | Admit: 2022-08-29 | Discharge: 2022-09-03 | DRG: 164 | Disposition: A | Discharge status: Home / Self Care | Payer: PPO | Attending: Osteopathic Medicine | Admitting: Osteopathic Medicine

## 2022-08-29 DIAGNOSIS — Y848 Other medical procedures as the cause of abnormal reaction of the patient, or of later complication, without mention of misadventure at the time of the procedure: Secondary | ICD-10-CM | POA: Diagnosis present

## 2022-08-29 DIAGNOSIS — I2510 Atherosclerotic heart disease of native coronary artery without angina pectoris: Secondary | ICD-10-CM | POA: Diagnosis present

## 2022-08-29 DIAGNOSIS — Z882 Allergy status to sulfonamides status: Secondary | ICD-10-CM

## 2022-08-29 DIAGNOSIS — Z803 Family history of malignant neoplasm of breast: Secondary | ICD-10-CM

## 2022-08-29 DIAGNOSIS — I10 Essential (primary) hypertension: Secondary | ICD-10-CM | POA: Diagnosis present

## 2022-08-29 DIAGNOSIS — Z87891 Personal history of nicotine dependence: Secondary | ICD-10-CM

## 2022-08-29 DIAGNOSIS — D509 Iron deficiency anemia, unspecified: Secondary | ICD-10-CM | POA: Diagnosis present

## 2022-08-29 DIAGNOSIS — J95811 Postprocedural pneumothorax: Principal | ICD-10-CM | POA: Diagnosis present

## 2022-08-29 DIAGNOSIS — Z6839 Body mass index (BMI) 39.0-39.9, adult: Secondary | ICD-10-CM

## 2022-08-29 DIAGNOSIS — Z8719 Personal history of other diseases of the digestive system: Secondary | ICD-10-CM

## 2022-08-29 DIAGNOSIS — J8489 Other specified interstitial pulmonary diseases: Secondary | ICD-10-CM | POA: Diagnosis present

## 2022-08-29 DIAGNOSIS — R918 Other nonspecific abnormal finding of lung field: Secondary | ICD-10-CM | POA: Diagnosis present

## 2022-08-29 DIAGNOSIS — Z886 Allergy status to analgesic agent status: Secondary | ICD-10-CM

## 2022-08-29 DIAGNOSIS — M47812 Spondylosis without myelopathy or radiculopathy, cervical region: Secondary | ICD-10-CM | POA: Diagnosis present

## 2022-08-29 DIAGNOSIS — Z96653 Presence of artificial knee joint, bilateral: Secondary | ICD-10-CM | POA: Diagnosis present

## 2022-08-29 DIAGNOSIS — Z8711 Personal history of peptic ulcer disease: Secondary | ICD-10-CM

## 2022-08-29 DIAGNOSIS — Z91040 Latex allergy status: Secondary | ICD-10-CM

## 2022-08-29 DIAGNOSIS — Z90710 Acquired absence of both cervix and uterus: Secondary | ICD-10-CM

## 2022-08-29 DIAGNOSIS — Z9049 Acquired absence of other specified parts of digestive tract: Secondary | ICD-10-CM

## 2022-08-29 DIAGNOSIS — J4540 Moderate persistent asthma, uncomplicated: Secondary | ICD-10-CM | POA: Diagnosis present

## 2022-08-29 DIAGNOSIS — Z85828 Personal history of other malignant neoplasm of skin: Secondary | ICD-10-CM

## 2022-08-29 DIAGNOSIS — Z888 Allergy status to other drugs, medicaments and biological substances status: Secondary | ICD-10-CM

## 2022-08-29 DIAGNOSIS — J939 Pneumothorax, unspecified: Principal | ICD-10-CM | POA: Diagnosis present

## 2022-08-29 DIAGNOSIS — E559 Vitamin D deficiency, unspecified: Secondary | ICD-10-CM | POA: Diagnosis present

## 2022-08-29 DIAGNOSIS — G4733 Obstructive sleep apnea (adult) (pediatric): Secondary | ICD-10-CM | POA: Diagnosis present

## 2022-08-29 DIAGNOSIS — Z1152 Encounter for screening for COVID-19: Secondary | ICD-10-CM

## 2022-08-29 DIAGNOSIS — R911 Solitary pulmonary nodule: Secondary | ICD-10-CM | POA: Diagnosis not present

## 2022-08-29 DIAGNOSIS — Z8249 Family history of ischemic heart disease and other diseases of the circulatory system: Secondary | ICD-10-CM

## 2022-08-29 DIAGNOSIS — J90 Pleural effusion, not elsewhere classified: Secondary | ICD-10-CM | POA: Diagnosis not present

## 2022-08-29 DIAGNOSIS — K219 Gastro-esophageal reflux disease without esophagitis: Secondary | ICD-10-CM | POA: Diagnosis present

## 2022-08-29 DIAGNOSIS — T17890A Other foreign object in other parts of respiratory tract causing asphyxiation, initial encounter: Secondary | ICD-10-CM | POA: Diagnosis present

## 2022-08-29 DIAGNOSIS — Z7951 Long term (current) use of inhaled steroids: Secondary | ICD-10-CM

## 2022-08-29 DIAGNOSIS — J8410 Pulmonary fibrosis, unspecified: Secondary | ICD-10-CM | POA: Diagnosis present

## 2022-08-29 DIAGNOSIS — Z79899 Other long term (current) drug therapy: Secondary | ICD-10-CM

## 2022-08-29 DIAGNOSIS — Z8616 Personal history of COVID-19: Secondary | ICD-10-CM

## 2022-08-29 DIAGNOSIS — E785 Hyperlipidemia, unspecified: Secondary | ICD-10-CM | POA: Diagnosis present

## 2022-08-29 DIAGNOSIS — M109 Gout, unspecified: Secondary | ICD-10-CM | POA: Diagnosis present

## 2022-08-29 HISTORY — PX: VIDEO BRONCHOSCOPY WITH ENDOBRONCHIAL ULTRASOUND: SHX6177

## 2022-08-29 LAB — CBC
HCT: 40.4 % (ref 36.0–46.0)
Hemoglobin: 13 g/dL (ref 12.0–15.0)
MCH: 25.4 pg — ABNORMAL LOW (ref 26.0–34.0)
MCHC: 32.2 g/dL (ref 30.0–36.0)
MCV: 78.9 fL — ABNORMAL LOW (ref 80.0–100.0)
Platelets: 194 10*3/uL (ref 150–400)
RBC: 5.12 MIL/uL — ABNORMAL HIGH (ref 3.87–5.11)
RDW: 16.1 % — ABNORMAL HIGH (ref 11.5–15.5)
WBC: 6.8 10*3/uL (ref 4.0–10.5)
nRBC: 0 % (ref 0.0–0.2)

## 2022-08-29 LAB — CREATININE, SERUM
Creatinine, Ser: 1.12 mg/dL — ABNORMAL HIGH (ref 0.44–1.00)
GFR, Estimated: 54 mL/min — ABNORMAL LOW (ref >60)

## 2022-08-29 SURGERY — BRONCHOSCOPY, WITH EBUS
Anesthesia: General

## 2022-08-29 MED ORDER — BENZONATATE 100 MG PO CAPS: 200.0000 mg | ORAL_CAPSULE | Freq: Two times a day (BID) | ORAL | Status: DC | PRN

## 2022-08-29 MED ORDER — PHENYLEPHRINE 80 MCG/ML (10ML) SYRINGE FOR IV PUSH (FOR BLOOD PRESSURE SUPPORT)
PREFILLED_SYRINGE | INTRAVENOUS | Status: AC
  Filled 2022-08-29: qty 10

## 2022-08-29 MED ORDER — PROPOFOL 10 MG/ML IV BOLUS
INTRAVENOUS | Status: DC | PRN
  Administered 2022-08-29: 150 mg via INTRAVENOUS

## 2022-08-29 MED ORDER — ADULT MULTIVITAMIN W/MINERALS CH
1.0000 | ORAL_TABLET | Freq: Every day | ORAL | Status: DC
  Administered 2022-08-29 – 2022-09-03 (×6): 1 via ORAL
  Filled 2022-08-29 (×6): qty 1

## 2022-08-29 MED ORDER — SENNOSIDES-DOCUSATE SODIUM 8.6-50 MG PO TABS: 1.0000 | ORAL_TABLET | Freq: Every evening | ORAL | Status: DC | PRN

## 2022-08-29 MED ORDER — LIDOCAINE HCL (PF) 1 % IJ SOLN
30.0000 mL | Freq: Once | INTRAMUSCULAR | Status: DC
  Filled 2022-08-29: qty 30

## 2022-08-29 MED ORDER — PROPOFOL 10 MG/ML IV BOLUS
INTRAVENOUS | Status: AC
  Filled 2022-08-29: qty 20

## 2022-08-29 MED ORDER — ROCURONIUM BROMIDE 100 MG/10ML IV SOLN
INTRAVENOUS | Status: DC | PRN
  Administered 2022-08-29: 20 mg via INTRAVENOUS
  Administered 2022-08-29 (×3): 10 mg via INTRAVENOUS
  Administered 2022-08-29: 40 mg via INTRAVENOUS

## 2022-08-29 MED ORDER — FENTANYL CITRATE (PF) 100 MCG/2ML IJ SOLN
INTRAMUSCULAR | Status: DC | PRN
  Administered 2022-08-29: 50 ug via INTRAVENOUS

## 2022-08-29 MED ORDER — TRAZODONE HCL 50 MG PO TABS: 25.0000 mg | ORAL_TABLET | Freq: Every evening | ORAL | Status: DC | PRN

## 2022-08-29 MED ORDER — MIDAZOLAM HCL 2 MG/2ML IJ SOLN
INTRAMUSCULAR | Status: DC | PRN
  Administered 2022-08-29 (×2): 1 mg via INTRAVENOUS

## 2022-08-29 MED ORDER — OXYCODONE HCL 5 MG PO TABS: 5.0000 mg | ORAL_TABLET | ORAL | Status: DC | PRN

## 2022-08-29 MED ORDER — FENTANYL CITRATE (PF) 100 MCG/2ML IJ SOLN
INTRAMUSCULAR | Status: AC
  Filled 2022-08-29: qty 2

## 2022-08-29 MED ORDER — SUGAMMADEX SODIUM 200 MG/2ML IV SOLN
INTRAVENOUS | Status: DC | PRN
  Administered 2022-08-29: 250 mg via INTRAVENOUS

## 2022-08-29 MED ORDER — LIDOCAINE HCL (PF) 1 % IJ SOLN
10.0000 mL | Freq: Once | INTRAMUSCULAR | Status: AC
  Administered 2022-08-29: 10 mL via INTRADERMAL

## 2022-08-29 MED ORDER — SUCCINYLCHOLINE CHLORIDE 200 MG/10ML IV SOSY
PREFILLED_SYRINGE | INTRAVENOUS | Status: DC | PRN
  Administered 2022-08-29: 100 mg via INTRAVENOUS

## 2022-08-29 MED ORDER — PHENYLEPHRINE HCL (PRESSORS) 10 MG/ML IV SOLN
INTRAVENOUS | Status: DC | PRN
  Administered 2022-08-29: 120 ug via INTRAVENOUS

## 2022-08-29 MED ORDER — BUTAMBEN-TETRACAINE-BENZOCAINE 2-2-14 % EX AERO
1.0000 | INHALATION_SPRAY | Freq: Once | CUTANEOUS | Status: DC
  Filled 2022-08-29: qty 20

## 2022-08-29 MED ORDER — MORPHINE SULFATE (PF) 2 MG/ML IV SOLN: 2.0000 mg | INTRAVENOUS | Status: DC | PRN

## 2022-08-29 MED ORDER — ONDANSETRON HCL 4 MG PO TABS: 4.0000 mg | ORAL_TABLET | Freq: Four times a day (QID) | ORAL | Status: DC | PRN

## 2022-08-29 MED ORDER — LOSARTAN POTASSIUM 50 MG PO TABS
50.0000 mg | ORAL_TABLET | Freq: Every day | ORAL | Status: DC
  Administered 2022-08-29 – 2022-09-03 (×6): 50 mg via ORAL
  Filled 2022-08-29 (×6): qty 1

## 2022-08-29 MED ORDER — LACTATED RINGERS IV SOLN: INTRAVENOUS | Status: DC

## 2022-08-29 MED ORDER — ONDANSETRON HCL 4 MG/2ML IJ SOLN
INTRAMUSCULAR | Status: DC | PRN
  Administered 2022-08-29: 4 mg via INTRAVENOUS

## 2022-08-29 MED ORDER — PHENYLEPHRINE HCL 0.25 % NA SOLN
1.0000 | Freq: Four times a day (QID) | NASAL | Status: DC | PRN
  Filled 2022-08-29: qty 15

## 2022-08-29 MED ORDER — PANTOPRAZOLE SODIUM 40 MG PO TBEC
40.0000 mg | DELAYED_RELEASE_TABLET | Freq: Two times a day (BID) | ORAL | Status: DC
  Administered 2022-08-29 – 2022-09-03 (×10): 40 mg via ORAL
  Filled 2022-08-29 (×10): qty 1

## 2022-08-29 MED ORDER — CHLORHEXIDINE GLUCONATE 0.12 % MT SOLN
15.0000 mL | Freq: Once | OROMUCOSAL | Status: AC
  Administered 2022-08-29: 15 mL via OROMUCOSAL

## 2022-08-29 MED ORDER — ENOXAPARIN SODIUM 40 MG/0.4ML IJ SOSY
40.0000 mg | PREFILLED_SYRINGE | INTRAMUSCULAR | Status: DC
  Administered 2022-08-30 – 2022-08-31 (×2): 40 mg via SUBCUTANEOUS
  Filled 2022-08-29 (×2): qty 0.4

## 2022-08-29 MED ORDER — MONTELUKAST SODIUM 10 MG PO TABS
10.0000 mg | ORAL_TABLET | Freq: Every day | ORAL | Status: DC
  Administered 2022-08-29 – 2022-09-03 (×6): 10 mg via ORAL
  Filled 2022-08-29 (×6): qty 1

## 2022-08-29 MED ORDER — ORAL CARE MOUTH RINSE: 15.0000 mL | Freq: Once | OROMUCOSAL | Status: AC

## 2022-08-29 MED ORDER — PHENYLEPHRINE HCL-NACL 20-0.9 MG/250ML-% IV SOLN
INTRAVENOUS | Status: DC | PRN
  Administered 2022-08-29: 20 ug/min via INTRAVENOUS

## 2022-08-29 MED ORDER — MIDAZOLAM HCL 2 MG/2ML IJ SOLN
INTRAMUSCULAR | Status: AC
  Filled 2022-08-29: qty 2

## 2022-08-29 MED ORDER — DEXAMETHASONE SODIUM PHOSPHATE 10 MG/ML IJ SOLN
INTRAMUSCULAR | Status: DC | PRN
  Administered 2022-08-29 (×2): 5 mg via INTRAVENOUS

## 2022-08-29 MED ORDER — LIDOCAINE HCL URETHRAL/MUCOSAL 2 % EX GEL
1.0000 | Freq: Once | CUTANEOUS | Status: DC
  Filled 2022-08-29: qty 5

## 2022-08-29 MED ORDER — BISACODYL 5 MG PO TBEC: 5.0000 mg | DELAYED_RELEASE_TABLET | Freq: Every day | ORAL | Status: DC | PRN

## 2022-08-29 MED ORDER — ONDANSETRON HCL 4 MG/2ML IJ SOLN: 4.0000 mg | Freq: Four times a day (QID) | INTRAMUSCULAR | Status: DC | PRN

## 2022-08-29 MED ORDER — LIDOCAINE HCL (CARDIAC) PF 100 MG/5ML IV SOSY
PREFILLED_SYRINGE | INTRAVENOUS | Status: DC | PRN
  Administered 2022-08-29: 60 mg via INTRAVENOUS

## 2022-08-29 MED ORDER — CHLORHEXIDINE GLUCONATE 0.12 % MT SOLN
OROMUCOSAL | Status: AC
  Filled 2022-08-29: qty 15

## 2022-08-29 NOTE — Consult Note (Signed)
PULMONOLOGY         Date: 08/29/2022,   MRN# [id number] JAYDON SOROKA [birth year]     AdmissionWeight: 108.9 kg                 CurrentWeight: 108.9 kg  Referring provider: Dr Jenne Campus   CHIEF COMPLAINT:   Hypermetabolic lung nodules   HISTORY OF PRESENT ILLNESS   67 year old female with a history of chronic moderate persistent asthma, osteoarthritis of the neck, coronary artery disease history of skin basal cell carcinoma, carpal tunnel syndrome, colonic polyps, dyspnea on exertion, GERD, gout, dyslipidemia, essential hypertension, iron deficiency, obstructive sleep apnea, vitamin D deficiency for reevaluation of hypermetabolic lung nodules.  Specifically question remains if this possibly cancer versus vasculitis as therapy is quite different.  Patient did report improvement after prednisone however this therapy is generally temporary and patient needs definitive treatment whether this is a vasculitis or not.  Additionally any mucous plugging will be removed via aspiration, bronchoalveolar lavage will be performed for additional microbiology transbronchial and endobronchial biopsies will be performed for histologic evaluation for atypia including carcinoma as well as organizing pneumonia versus vasculitis.   PAST MEDICAL HISTORY   Past Medical History:  Diagnosis Date   Arthritis    neck, knees   Asthma    Calcification of right carotid artery    Cancer of the skin, basal cell    a.) s/p excision from neck, back, and foot   Carpal tunnel syndrome    Colon polyps    Difficult intubation    Dyspnea    Gastric ulcer    GERD (gastroesophageal reflux disease)    Gout    History of 2019 novel coronavirus disease (COVID-19) 10/21/2020   Hyperlipidemia    Hypertension    Iron deficiency    Iron deficiency anemia    OSA (obstructive sleep apnea)    a.) does not require nocturnal PAP therapy   Pulmonary nodule    Vertigo    last episode 12/2018   Vitamin D deficiency     Wears dentures    partial upper     SURGICAL HISTORY   Past Surgical History:  Procedure Laterality Date   ABDOMINAL HYSTERECTOMY     APPENDECTOMY     BACK SURGERY     x2 L5 FOR RUPTURED DISC   CARDIAC CATHETERIZATION  1990's   CATARACT EXTRACTION W/PHACO Left 04/07/2019   Procedure: CATARACT EXTRACTION PHACO AND INTRAOCULAR LENS PLACEMENT (IOC) LEFT 4.43, 00:34.1;  Surgeon: Galen Manila, MD;  Location: [facility];  Service: Ophthalmology;  Laterality: Left;  Sleep Apnea-does not wear CPAP Latex-adhesives   CATARACT EXTRACTION W/PHACO Right 05/05/2019   Procedure: CATARACT EXTRACTION PHACO AND INTRAOCULAR LENS PLACEMENT (IOC) RIGHT;  4.83, 00:33.4;  Surgeon: Galen Manila, MD;  Location: [facility];  Service: Ophthalmology;  Laterality: Right;  SLEP APNEA-CPAP   CHOLECYSTECTOMY     COLONOSCOPY     2006, 2009, 2014, 2019   COLONOSCOPY WITH PROPOFOL N/A 10/16/2017   Procedure: COLONOSCOPY WITH PROPOFOL;  Surgeon: Scot Jun, MD;  Location: [facility] END[facility]COPY;  Service: Endoscopy;  Laterality: N/A;   ESOPHAGOGASTRODUODENOSCOPY     2006, 2014, 2015,2019   ESOPHAGOGASTRODUODENOSCOPY (EGD) WITH PROPOFOL N/A 10/16/2017   Procedure: ESOPHAGOGASTRODUODENOSCOPY (EGD) WITH PROPOFOL;  Surgeon: Scot Jun, MD;  Location: [facility] ENDOSCOPY;  Service: Endoscopy;  Laterality: N/A;   EUS N/A 08/09/2022   Procedure: UPPER ENDOSCOPIC ULTRASOUND (EUS) LINEAR;  Surgeon: Bearl Mulberry, MD;  Location: [facility]  ENDOSCOPY;  Service: Gastroenterology;  Laterality: N/A;   FUNCTIONAL ENDOSCOPIC SINUS SURGERY     JOINT REPLACEMENT     KNEE ARTHROSCOPY Right 11/30/2002   LUMBAR DISC SURGERY     ruptured disc x 2   TONSILLECTOMY AND ADENOIDECTOMY     TOTAL KNEE ARTHROPLASTY Right 10/25/2020   Procedure: TOTAL KNEE ARTHROPLASTY - [place] Neighbor to assist;  Surgeon: Kennedy Bucker, MD;  Location: [facility];  Service: Orthopedics;  Laterality: Right;   TOTAL KNEE ARTHROPLASTY  Left 07/06/2021   Procedure: TOTAL KNEE ARTHROPLASTY;  Surgeon: Kennedy Bucker, MD;  Location: [facility];  Service: Orthopedics;  Laterality: Left;   VIDEO BRONCHOSCOPY WITH ENDOBRONCHIAL ULTRASOUND N/A 06/20/2022   Procedure: VIDEO BRONCHOSCOPY WITH ENDOBRONCHIAL ULTRASOUND;  Surgeon: Vida Rigger, MD;  Location: [facility];  Service: Thoracic;  Laterality: N/A;     FAMILY HISTORY   Family History  Problem Relation Age of Onset   Breast cancer Mother 59   Heart attack Father    Heart attack Brother      SOCIAL HISTORY   Social History   Tobacco Use   Smoking status: Never   Smokeless tobacco: Never   Tobacco comments:    former social smoker  Vaping Use   Vaping Use: Never used  Substance Use Topics   Alcohol use: No    Alcohol/week: 0.0 standard drinks of alcohol   Drug use: No     MEDICATIONS    Home Medication:    Current Medication:  Current Facility-Administered Medications:    butamben-tetracaine-benzocaine (CETACAINE) spray 1 spray, 1 spray, Topical, Once, Vida Rigger, MD   lactated ringers infusion, , Intravenous, Continuous, Louie Boston, MD, Last Rate: 10 mL/hr at 08/29/22 1140, New Bag at 08/29/22 1140   lidocaine (PF) (XYLOCAINE) 1 % injection 30 mL, 30 mL, Other, Once, Holliday Sheaffer, MD   lidocaine (XYLOCAINE) 2 % jelly 1 Application, 1 Application, Topical, Once, Lousie Calico, MD   phenylephrine (NEO-SYNEPHRINE) 0.25 % nasal spray 1 spray, 1 spray, Each Nare, Q6H PRN, Karna Christmas, Kindal Ponti, MD    ALLERGIES   Albuterol, Beclomethasone, Sulfa antibiotics, Lodine [etodolac], Latex, and Meloxicam     REVIEW OF SYSTEMS    Review of Systems:  Gen:  Denies  fever, sweats, chills weigh loss  HEENT: Denies blurred vision, double vision, ear pain, eye pain, hearing loss, nose bleeds, sore throat Cardiac:  No dizziness, chest pain or heaviness, chest tightness,edema Resp:   reports dyspnea chronically  Gi: Denies swallowing difficulty,  stomach pain, nausea or vomiting, diarrhea, constipation, bowel incontinence Gu:  Denies bladder incontinence, burning urine Ext:   Denies Joint pain, stiffness or swelling Skin: Denies  skin rash, easy bruising or bleeding or hives Endoc:  Denies polyuria, polydipsia , polyphagia or weight change Psych:   Denies depression, insomnia or hallucinations   Other:  All other systems negative   VS: BP 136/82   Pulse 83   Temp 97.8 F (36.6 C) (Temporal)   Resp 18   Ht 5\' 5"  (1.651 m)   Wt 108.9 kg   SpO2 95%   BMI 39.94 kg/m      PHYSICAL EXAM    GENERAL:NAD, no fevers, chills, no weakness no fatigue HEAD: Normocephalic, atraumatic.  EYES: Pupils equal, round, reactive to light. Extraocular muscles intact. No scleral icterus.  MOUTH: Moist mucosal membrane. Dentition intact. No abscess noted.  EAR, NOSE, THROAT: Clear without exudates. No external lesions.  NECK: Supple. No thyromegaly. No nodules. No JVD.  PULMONARY: decreased breath  sounds with mild rhonchi worse at bases bilaterally.  CARDIOVASCULAR: S1 and S2. Regular rate and rhythm. No murmurs, rubs, or gallops. No edema. Pedal pulses 2+ bilaterally.  GASTROINTESTINAL: Soft, nontender, nondistended. No masses. Positive bowel sounds. No hepatosplenomegaly.  MUSCULOSKELETAL: No swelling, clubbing, or edema. Range of motion full in all extremities.  NEUROLOGIC: Cranial nerves II through XII are intact. No gross focal neurological deficits. Sensation intact. Reflexes intact.  SKIN: No ulceration, lesions, rashes, or cyanosis. Skin warm and dry. Turgor intact.  PSYCHIATRIC: Mood, affect within normal limits. The patient is awake, alert and oriented x 3. Insight, judgment intact.       IMAGING   Most recent CT and PET scan reviewed.   ASSESSMENT/PLAN   Bilateral pulmonary nodules  -interval improvement but not resolution post steroid therapy  - would like to have definitive diagnsosis regarding vascultis vs carcinoma vs  ongoing infection    - plan for BAL, therapeutic aspiration of tracheobronchial tree and lung biopsy via transbronchial and endobronchial approach.   - plan to use [facility] platform and endobronchial ultrasound  - no new findings with patient on examination   - questions answered and husband waiting for patietn  - -Reviewed risks/complications and benefits with patient, risks include infection, pneumothorax/pneumomediastinum which may require chest tube placement as well as overnight/prolonged hospitalization and possible mechanical ventilation. Other risks include bleeding and very rarely death.  Patient understands risks and wishes to proceed.  Additional questions were answered, and patient is aware that post procedure patient will be going home with family and may experience cough with possible clots on expectoration as well as phlegm which may last few days as well as hoarseness of voice post intubation and mechanical ventilation.              Thank you for allowing me to participate in the care of this patient.   Patient/Family are satisfied with care plan and all questions have been answered.    Provider disclosure: Patient with at least one acute or chronic illness or injury that poses a threat to life or bodily function and is being managed actively during this encounter.  All of the below services have been performed independently by signing provider:  review of prior documentation from internal and or external health records.  Review of previous and current lab results.  Interview and comprehensive assessment during patient visit today. Review of current and previous chest radiographs/CT scans. Discussion of management and test interpretation with health care team and patient/family.   This document was prepared using Dragon voice recognition software and may include unintentional dictation errors.     Vida Rigger, M.D.  Division of Pulmonary & Critical Care Medicine

## 2022-08-29 NOTE — Anesthesia Preprocedure Evaluation (Signed)
Anesthesia Evaluation  Patient identified by MRN, date of birth, ID band Patient awake    Reviewed: Allergy & Precautions, NPO status , Patient's Chart, lab work & pertinent test results  History of Anesthesia Complications (+) DIFFICULT AIRWAY and history of anesthetic complications  Airway Mallampati: I  TM Distance: >3 FB Neck ROM: full    Dental  (+) Chipped, Poor Dentition, Missing   Pulmonary shortness of breath, asthma , sleep apnea , Recent URI    Pulmonary exam normal        Cardiovascular hypertension, negative cardio ROS Normal cardiovascular exam     Neuro/Psych  Neuromuscular disease  negative psych ROS   GI/Hepatic Neg liver ROS, PUD,GERD  Controlled,,  Endo/Other  negative endocrine ROS    Renal/GU      Musculoskeletal   Abdominal   Peds  Hematology negative hematology ROS (+)   Anesthesia Other Findings Past Medical History: No date: Arthritis     Comment:  neck, knees No date: Asthma No date: Calcification of right carotid artery No date: Cancer of the skin, basal cell     Comment:  a.) s/p excision from neck, back, and foot No date: Carpal tunnel syndrome No date: Colon polyps No date: Difficult intubation No date: Dyspnea No date: Gastric ulcer No date: GERD (gastroesophageal reflux disease) No date: Gout 10/21/2020: History of 2019 novel coronavirus disease (COVID-19) No date: Hyperlipidemia No date: Hypertension No date: Iron deficiency No date: Iron deficiency anemia No date: OSA (obstructive sleep apnea)     Comment:  a.) does not require nocturnal PAP therapy No date: Pulmonary nodule No date: Vertigo     Comment:  last episode 12/2018 No date: Vitamin D deficiency No date: Wears dentures     Comment:  partial upper  Past Surgical History: No date: ABDOMINAL HYSTERECTOMY No date: APPENDECTOMY No date: BACK SURGERY     Comment:  x2 L5 FOR RUPTURED DISC 1990's: CARDIAC  CATHETERIZATION 04/07/2019: CATARACT EXTRACTION W/PHACO; Left     Comment:  Procedure: CATARACT EXTRACTION PHACO AND INTRAOCULAR               LENS PLACEMENT (IOC) LEFT 4.43, 00:34.1;  Surgeon:               Galen Manila, MD;  Location: [facility];                Service: Ophthalmology;  Laterality: Left;  Sleep               Apnea-does not wear CPAP Latex-adhesives 05/05/2019: CATARACT EXTRACTION W/PHACO; Right     Comment:  Procedure: CATARACT EXTRACTION PHACO AND INTRAOCULAR               LENS PLACEMENT (IOC) RIGHT;  4.83, 00:33.4;  Surgeon:               Galen Manila, MD;  Location: [facility];                Service: Ophthalmology;  Laterality: Right;  SLEP               APNEA-CPAP No date: CHOLECYSTECTOMY No date: COLONOSCOPY     Comment:  2006, 2009, 2014, 2019 10/16/2017: COLONOSCOPY WITH PROPOFOL; N/A     Comment:  Procedure: COLONOSCOPY WITH PROPOFOL;  Surgeon: Scot Jun, MD;  Location: [facility] ENDOSCOPY;  Service:  Endoscopy;  Laterality: N/A; No date: ESOPHAGOGASTRODUODENOSCOPY     Comment:  2006, 2014, 2015,2019 10/16/2017: ESOPHAGOGASTRODUODENOSCOPY (EGD) WITH PROPOFOL; N/A     Comment:  Procedure: ESOPHAGOGASTRODUODENOSCOPY (EGD) WITH               PROPOFOL;  Surgeon: Scot Jun, MD;  Location:               [facility]COPY;  Service: Endoscopy;  Laterality: N/A; 08/09/2022: EUS; N/A     Comment:  Procedure: UPPER ENDOSCOPIC ULTRASOUND (EUS) LINEAR;                Surgeon: Bearl Mulberry, MD;  Location: [facility]               ENDOSCOPY;  Service: Gastroenterology;  Laterality: N/A; No date: FUNCTIONAL ENDOSCOPIC SINUS SURGERY No date: JOINT REPLACEMENT 11/30/2002: KNEE ARTHROSCOPY; Right No date: LUMBAR DISC SURGERY     Comment:  ruptured disc x 2 No date: TONSILLECTOMY AND ADENOIDECTOMY 10/25/2020: TOTAL KNEE ARTHROPLASTY; Right     Comment:  Procedure: TOTAL KNEE ARTHROPLASTY - [place] Neighbor to                assist;  Surgeon: Kennedy Bucker, MD;  Location: [facility];              Service: Orthopedics;  Laterality: Right; 07/06/2021: TOTAL KNEE ARTHROPLASTY; Left     Comment:  Procedure: TOTAL KNEE ARTHROPLASTY;  Surgeon: Kennedy Bucker, MD;  Location: [facility];  Service: Orthopedics;               Laterality: Left; 06/20/2022: VIDEO BRONCHOSCOPY WITH ENDOBRONCHIAL ULTRASOUND; N/A     Comment:  Procedure: VIDEO BRONCHOSCOPY WITH ENDOBRONCHIAL               ULTRASOUND;  Surgeon: Vida Rigger, MD;  Location:               [facility];  Service: Thoracic;  Laterality: N/A;  BMI    Body Mass Index: 39.94 kg/m      Reproductive/Obstetrics negative OB ROS                             Anesthesia Physical Anesthesia Plan  ASA: 3  Anesthesia Plan: General ETT   Post-op Pain Management:    Induction: Intravenous  PONV Risk Score and Plan: Dexamethasone, Ondansetron, Midazolam and Treatment may vary due to age or medical condition  Airway Management Planned: Oral ETT and Video Laryngoscope Planned  Additional Equipment:   Intra-op Plan:   Post-operative Plan: Extubation in OR  Informed Consent: I have reviewed the patients History and Physical, chart, labs and discussed the procedure including the risks, benefits and alternatives for the proposed anesthesia with the patient or authorized representative who has indicated his/her understanding and acceptance.     Dental Advisory Given  Plan Discussed with: Anesthesiologist, CRNA and Surgeon  Anesthesia Plan Comments: (Patient consented for risks of anesthesia including but not limited to:  - adverse reactions to medications - damage to eyes, teeth, lips or other oral mucosa - nerve damage due to positioning  - sore throat or hoarseness - Damage to heart, brain, nerves, lungs, other parts of body or loss of life  Patient voiced understanding.)       Anesthesia Quick  Evaluation

## 2022-08-29 NOTE — Procedures (Signed)
Interventional Radiology Procedure Note  Date of Procedure: 08/29/2022  Procedure: CT guided chest tube placement, left   Findings:  1. Successful CT guided chest tube placement, left, 8 Fr for pneumothorax    Complications: No immediate complications noted.   Estimated Blood Loss: minimal  Follow-up and Recommendations: 1. Per Pulmonology    Olive Bass, MD  Vascular & Interventional Radiology  08/29/2022 4:54 PM

## 2022-08-29 NOTE — Anesthesia Postprocedure Evaluation (Signed)
Anesthesia Post Note  Patient: Sarah Marquez  Procedure(s) Performed: VIDEO BRONCHOSCOPY WITH ENDOBRONCHIAL ULTRASOUND ROBOTIC ASSISTED NAVIGATIONAL BRONCHOSCOPY  Patient location during evaluation: PACU Anesthesia Type: General Level of consciousness: awake and alert Pain management: pain level controlled Vital Signs Assessment: post-procedure vital signs reviewed and stable Respiratory status: spontaneous breathing, nonlabored ventilation, respiratory function stable and patient connected to nasal cannula oxygen Cardiovascular status: blood pressure returned to baseline and stable Postop Assessment: no apparent nausea or vomiting Anesthetic complications: no   No notable events documented.   Last Vitals:  Vitals:   08/29/22 1520 08/29/22 1530  BP: 133/84 (!) 150/85  Pulse: 88 85  Resp: 13 (!) 28  Temp: (!) 36.1 C   SpO2: 97% 98%    Last Pain:  Vitals:   08/29/22 1530  TempSrc:   PainSc: 0-No pain                 Louie Boston

## 2022-08-29 NOTE — Anesthesia Procedure Notes (Signed)
Procedure Name: Intubation Date/Time: 08/29/2022 1:12 PM  Performed by: Genia Del, CRNAPre-anesthesia Checklist: Patient identified, Patient being monitored, Timeout performed, Emergency Drugs available and Suction available Patient Re-evaluated:Patient Re-evaluated prior to induction Oxygen Delivery Method: Circle system utilized Preoxygenation: Pre-oxygenation with 100% oxygen Induction Type: IV induction Ventilation: Mask ventilation without difficulty Laryngoscope Size: McGraph and 4 Grade View: Grade I Tube type: Oral Tube size: 8.5 mm Number of attempts: 1 Airway Equipment and Method: Stylet Placement Confirmation: ETT inserted through vocal cords under direct vision, positive ETCO2 and breath sounds checked- equal and bilateral Secured at: 22 cm Tube secured with: Tape Dental Injury: Teeth and Oropharynx as per pre-operative assessment  Comments: Bronch MD described difficult intubation previously.   Grade 1 view with McGrath and ETT quickly and easily placed w/o any issues today.  Large glottic opening with 8.5 ETT easily inserted after cords sprayed with LTA 5 cc 4% Lidocaine.  Pt was an easy two hand mask without need for OA.

## 2022-08-29 NOTE — H&P (Signed)
HISTORY AND PHYSICAL    Sarah Marquez   WUJ:[id number] DOB: 1955-12-15   Date of Service: 08/29/22 Requesting physician/APP from ED: Treatment Team:  Attending Provider: Sunnie Nielsen, DO  PCP: Bosie Clos, MD     Sarah Marquez is a 67 y.o. female w/ PMH chronic moderate persistent asthma, osteoarthritis of the neck, coronary artery disease history of skin basal cell carcinoma, carpal tunnel syndrome, colonic polyps, dyspnea on exertion, GERD, gout, dyslipidemia, essential hypertension, iron deficiency, obstructive sleep apnea, vitamin D deficiency. She was seen today by Dr Karna Christmas for bronchoscopy / evaluation hypermetabolic lung nodules (to ascertain cancer vs vasculitis). Procedure was successful but complicated by L pneumothorax. Pt stable on 3L O2. IR asked to place pleural drain. Hospitalist consulted for admission.   HPI: Pt reports no concerns prior to her bronchoscopy today. Following bronchoscopy, reports mild SOB but feels okay at rest  and on O2.   Consultants:  IR Pulmonology   Procedures: 08/29/22 bronchoscopy w/ transbronchial and endobronchial biopsies for histologic evaluation, as well as therapeutic lavage/aspiration LUL mucus plugging,       ASSESSMENT & PLAN:   Active Problems:  L pneumothorax  Lung mass uncertain etiology  Chronic Problems:  Essential HTN  GERD  Moderate Persistent Asthma   HLD  OSA    L pneumothorax Chest tube in place IR/pulmonary following CXR in AM O2 support as needed   Lung mass uncertain etiology  Pending biopsy results  Essential HTN Cont home losartan  GERD Cont home PPI  Moderate Persistent Asthma  Albuterol prn   HLD Follow outpatient  OSA CPAP qhs      DVT prophylaxis: lovenox  Pertinent IV fluids/nutrition: no continuous IV fluids, regular diet  Central lines / invasive devices: L chest tube  Code Status: FULL CODE ACP documentation reviewed: 05/01, none on filein  [facility]  Current Admission Status: observation  TOC needs / Dispo plan: none Barriers to discharge / significant pending items: pending AM CXR and pulm/IR, may be able to remove chest tube tomorrow, but will change to inpatietn if needing to stay w/ chest tube longer                      Review of Systems:  Review of Systems  Constitutional:  Negative for chills and fever.  HENT:  Negative for sinus pain and sore throat.   Respiratory:  Positive for shortness of breath. Negative for cough.   Cardiovascular:  Negative for chest pain, palpitations, orthopnea and leg swelling.  Gastrointestinal:  Negative for abdominal pain, heartburn and nausea.  Genitourinary:  Negative for dysuria.  Musculoskeletal:  Negative for myalgias.  Neurological:  Negative for dizziness.  Psychiatric/Behavioral:  Negative for depression.        has a past medical history of Arthritis, Asthma, Calcification of right carotid artery, Cancer of the skin, basal cell, Carpal tunnel syndrome, Colon polyps, Difficult intubation, Dyspnea, Gastric ulcer, GERD (gastroesophageal reflux disease), Gout, History of 2019 novel coronavirus disease (COVID-19) (10/21/2020), Hyperlipidemia, Hypertension, Iron deficiency, Iron deficiency anemia, OSA (obstructive sleep apnea), Pulmonary nodule, Vertigo, Vitamin D deficiency, and Wears dentures. (Not in an outpatient encounter)   Allergies  Allergen Reactions   Albuterol     Nebulizer form, causes wheezing   Beclomethasone Swelling    Beconase Nose spray swelled face up Currently uses Qvar on occasion and it does not cause problems   Sulfa Antibiotics Hives   Lodine [Etodolac] Itching   Latex Rash  Adhesives, bandaids Paper tape is okay   Meloxicam Palpitations      family history includes Breast cancer (age of onset: 24) in her mother; Heart attack in her brother and father. Past Surgical History:  Procedure Laterality Date   ABDOMINAL HYSTERECTOMY      APPENDECTOMY     BACK SURGERY     x2 L5 FOR RUPTURED DISC   CARDIAC CATHETERIZATION  1990's   CATARACT EXTRACTION W/PHACO Left 04/07/2019   Procedure: CATARACT EXTRACTION PHACO AND INTRAOCULAR LENS PLACEMENT (IOC) LEFT 4.43, 00:34.1;  Surgeon: Galen Manila, MD;  Location: [facility];  Service: Ophthalmology;  Laterality: Left;  Sleep Apnea-does not wear CPAP Latex-adhesives   CATARACT EXTRACTION W/PHACO Right 05/05/2019   Procedure: CATARACT EXTRACTION PHACO AND INTRAOCULAR LENS PLACEMENT (IOC) RIGHT;  4.83, 00:33.4;  Surgeon: Galen Manila, MD;  Location: [facility];  Service: Ophthalmology;  Laterality: Right;  SLEP APNEA-CPAP   CHOLECYSTECTOMY     COLONOSCOPY     2006, 2009, 2014, 2019   COLONOSCOPY WITH PROPOFOL N/A 10/16/2017   Procedure: COLONOSCOPY WITH PROPOFOL;  Surgeon: Scot Jun, MD;  Location: [facility];  Service: Endoscopy;  Laterality: N/A;   ESOPHAGOGASTRODUODENOSCOPY     2006, 2014, 2015,2019   ESOPHAGOGASTRODUODENOSCOPY (EGD) WITH PROPOFOL N/A 10/16/2017   Procedure: ESOPHAGOGASTRODUODENOSCOPY (EGD) WITH PROPOFOL;  Surgeon: Scot Jun, MD;  Location: [facility];  Service: Endoscopy;  Laterality: N/A;   EUS N/A 08/09/2022   Procedure: UPPER ENDOSCOPIC ULTRASOUND (EUS) LINEAR;  Surgeon: Bearl Mulberry, MD;  [facility];  Service: Gastroenterology;  Laterality: N/A;   FUNCTIONAL ENDOSCOPIC SINUS SURGERY     JOINT REPLACEMENT     KNEE ARTHROSCOPY Right 11/30/2002   LUMBAR DISC SURGERY     ruptured disc x 2   TONSILLECTOMY AND ADENOIDECTOMY     TOTAL KNEE ARTHROPLASTY Right 10/25/2020   Procedure: TOTAL KNEE ARTHROPLASTY - [place] Neighbor to assist;  Surgeon: Kennedy Bucker, MD;  Location: [facility];  Service: Orthopedics;  Laterality: Right;   TOTAL KNEE ARTHROPLASTY Left 07/06/2021   Procedure: TOTAL KNEE ARTHROPLASTY;  Surgeon: Kennedy Bucker, MD;  Location: [facility];  Service: Orthopedics;  Laterality:  Left;   VIDEO BRONCHOSCOPY WITH ENDOBRONCHIAL ULTRASOUND N/A 06/20/2022   Procedure: VIDEO BRONCHOSCOPY WITH ENDOBRONCHIAL ULTRASOUND;  Surgeon: Vida Rigger, MD;  Location: [facility];  Service: Thoracic;  Laterality: N/A;          Objective Findings:  Vitals:   08/29/22 1645 08/29/22 1700 08/29/22 1715 08/29/22 1730  BP: (!) 147/82 136/78 (!) 147/88 (!) 143/84  Pulse: 87 92 94 93  Resp: (!) 26 (!) 22 16   Temp:  (!) 97.1 F (36.2 C)    TempSrc:      SpO2: 96% 96% 95% 93%  Weight:      Height:        Intake/Output Summary (Last 24 hours) at 08/29/2022 1737 Last data filed at 08/29/2022 1451 Gross per 24 hour  Intake 400 ml  Output 10 ml  Net 390 ml   Filed Weights   08/29/22 1106  Weight: 108.9 kg    Examination:  Physical Exam Constitutional:      General: She is not in acute distress.    Appearance: Normal appearance.  Cardiovascular:     Rate and Rhythm: Normal rate and regular rhythm.     Pulses: Normal pulses.     Heart sounds: Normal heart sounds.  Pulmonary:     Effort: No accessory muscle usage or respiratory  distress.     Breath sounds: Examination of the left-upper field reveals decreased breath sounds. Examination of the left-middle field reveals decreased breath sounds. Decreased breath sounds present.  Abdominal:     General: Abdomen is flat.     Palpations: Abdomen is soft.  Neurological:     Mental Status: She is alert.          Scheduled Medications:   butamben-tetracaine-benzocaine  1 spray Topical Once   [START ON 08/30/2022] enoxaparin (LOVENOX) injection  40 mg Subcutaneous Q24H   lidocaine (PF)  30 mL Other Once   lidocaine  1 Application Topical Once    Continuous Infusions:  lactated ringers 100 mL/hr at 08/29/22 1413    PRN Medications:  bisacodyl, morphine injection, ondansetron **OR** ondansetron (ZOFRAN) IV, oxyCODONE, phenylephrine, senna-docusate, traZODone  Antimicrobials:  Anti-infectives (From admission, onward)     None           Data Reviewed: I have personally reviewed following labs and imaging studies  CBC: No results for input(s): "WBC", "NEUTROABS", "HGB", "HCT", "MCV", "PLT" in the last 168 hours. Basic Metabolic Panel: No results for input(s): "NA", "K", "CL", "CO2", "GLUCOSE", "BUN", "CREATININE", "CALCIUM", "MG", "PHOS" in the last 168 hours. GFR: CrCl cannot be calculated (Patient's most recent lab result is older than the maximum 21 days allowed.). Liver Function Tests: No results for input(s): "AST", "ALT", "ALKPHOS", "BILITOT", "PROT", "ALBUMIN" in the last 168 hours. No results for input(s): "LIPASE", "AMYLASE" in the last 168 hours. No results for input(s): "AMMONIA" in the last 168 hours. Coagulation Profile: No results for input(s): "INR", "PROTIME" in the last 168 hours. Cardiac Enzymes: No results for input(s): "CKTOTAL", "CKMB", "CKMBINDEX", "TROPONINI" in the last 168 hours. BNP (last 3 results) No results for input(s): "PROBNP" in the last 8760 hours. HbA1C: No results for input(s): "HGBA1C" in the last 72 hours. CBG: No results for input(s): "GLUCAP" in the last 168 hours. Lipid Profile: No results for input(s): "CHOL", "HDL", "LDLCALC", "TRIG", "CHOLHDL", "LDLDIRECT" in the last 72 hours. Thyroid Function Tests: No results for input(s): "TSH", "T4TOTAL", "FREET4", "T3FREE", "THYROIDAB" in the last 72 hours. Anemia Panel: No results for input(s): "VITAMINB12", "FOLATE", "FERRITIN", "TIBC", "IRON", "RETICCTPCT" in the last 72 hours. Most Recent Urinalysis On File:     Component Value Date/Time   COLORURINE YELLOW (A) 06/22/2021 1019   APPEARANCEUR HAZY (A) 06/22/2021 1019   LABSPEC 1.016 06/22/2021 1019   PHURINE 6.0 06/22/2021 1019   GLUCOSEU NEGATIVE 06/22/2021 1019   HGBUR NEGATIVE 06/22/2021 1019   BILIRUBINUR NEGATIVE 06/22/2021 1019   BILIRUBINUR negative 02/12/2020 1035   KETONESUR NEGATIVE 06/22/2021 1019   PROTEINUR NEGATIVE 06/22/2021 1019    UROBILINOGEN 0.2 02/12/2020 1035   NITRITE NEGATIVE 06/22/2021 1019   LEUKOCYTESUR MODERATE (A) 06/22/2021 1019   Sepsis Labs: @LABRCNTIP (procalcitonin:4,lacticidven:4)  Recent Results (from the past 240 hour(s))  SARS CORONAVIRUS 2 (TAT 6-24 HRS) Anterior Nasal Swab     Status: None   Collection Time: 08/27/22  3:25 PM   Specimen: Anterior Nasal Swab  Result Value Ref Range Status   SARS Coronavirus 2 NEGATIVE NEGATIVE Final    Comment: (NOTE) SARS-CoV-2 target nucleic acids are NOT DETECTED.  The SARS-CoV-2 RNA is generally detectable in upper and lower respiratory specimens during the acute phase of infection. Negative results do not preclude SARS-CoV-2 infection, do not rule out co-infections with other pathogens, and should not be used as the sole basis for treatment or other patient management decisions. Negative results must be combined  with clinical observations, patient history, and epidemiological information. The expected result is Negative.  Fact Sheet for Patients: HairSlick.no  Fact Sheet for Healthcare Providers: quierodirigir.com  This test is not yet approved or cleared by the [place] FDA and  has been authorized for detection and/or diagnosis of SARS-CoV-2 by FDA under an Emergency Use Authorization (EUA). This EUA will remain  in effect (meaning this test can be used) for the duration of the COVID-19 declaration under Se ction 564(b)(1) of the Act, 21 U.S.C. section 360bbb-3(b)(1), unless the authorization is terminated or revoked sooner.  Performed at [facility], [address], [place], [place] [address]          Radiology Studies: DG Chest Port 1 View  Result Date: 08/29/2022 CLINICAL DATA:  Post bronchoscopy EXAM: PORTABLE CHEST 1 VIEW COMPARISON:  Portable exam 1531 hours compared to 06/20/2022 FINDINGS: Large LEFT pneumothorax with complete collapse of LEFT lung.  Mediastinal shift to RIGHT. Findings are consistent tension pneumothorax. Scattered infiltrate RIGHT lung. Osseous structures unremarkable. IMPRESSION: Large LEFT tension pneumothorax with complete collapse of LEFT lung and mediastinal shift to RIGHT. Critical Value/emergent results were called by telephone at the time of interpretation on 08/29/2022 at 3:42 pm to provider FUAD ALESKEROV , who verbally acknowledged these results. Electronically Signed   By: Ulyses Southward M.D.   On: 08/29/2022 15:43   DG C-Arm 1-60 Min-No Report  Result Date: 08/29/2022 Fluoroscopy was utilized by the requesting physician.  No radiographic interpretation.             LOS: 0 days      Sunnie Nielsen, DO [facility] 08/29/2022, 5:37 PM    Dictation software may have been used to generate the above note. Typos may occur and escape review in typed/dictated notes. Please contact Dr Lyn Hollingshead directly for clarity if needed.  Staff may message me via secure chat in Epic  but this may not receive an immediate response,  please page me for urgent matters!  If 7PM-7AM, please contact night coverage [email or web address]

## 2022-08-29 NOTE — Consult Note (Signed)
Chief Complaint: Patient was seen in consultation today for large left pneumothorax at the request of Vida Rigger, MD  Referring Physician(s):  Vida Rigger, MD  Supervising Physician: Pernell Dupre  Patient Status: [facility] - Out-pt  History of Present Illness:  Sarah Marquez is a 67 y.o. female presented to [facility] today for bronchoscopy with for evaluation of hypermetabolic lung nodules, bronchoalveolar lavage and therapeutic aspiration of tracheobronchial tree.  Postprocedural CXR showed large left pneumothorax.  Patient was referred to IR for left chest tube placement.  Procedure was approved by Dr. Juliette Alcide.  Patient denies chest pain, shortness of breath or back pain.   Past Medical History:  Diagnosis Date   Arthritis    neck, knees   Asthma    Calcification of right carotid artery    Cancer of the skin, basal cell    a.) s/p excision from neck, back, and foot   Carpal tunnel syndrome    Colon polyps    Difficult intubation    Dyspnea    Gastric ulcer    GERD (gastroesophageal reflux disease)    Gout    History of 2019 novel coronavirus disease (COVID-19) 10/21/2020   Hyperlipidemia    Hypertension    Iron deficiency    Iron deficiency anemia    OSA (obstructive sleep apnea)    a.) does not require nocturnal PAP therapy   Pulmonary nodule    Vertigo    last episode 12/2018   Vitamin D deficiency    Wears dentures    partial upper    Past Surgical History:  Procedure Laterality Date   ABDOMINAL HYSTERECTOMY     APPENDECTOMY     BACK SURGERY     x2 L5 FOR RUPTURED DISC   CARDIAC CATHETERIZATION  1990's   CATARACT EXTRACTION W/PHACO Left 04/07/2019   Procedure: CATARACT EXTRACTION PHACO AND INTRAOCULAR LENS PLACEMENT (IOC) LEFT 4.43, 00:34.1;  Surgeon: Galen Manila, MD;  Location: [facility];  Service: Ophthalmology;  Laterality: Left;  Sleep Apnea-does not wear CPAP Latex-adhesives   CATARACT EXTRACTION W/PHACO Right 05/05/2019    Procedure: CATARACT EXTRACTION PHACO AND INTRAOCULAR LENS PLACEMENT (IOC) RIGHT;  4.83, 00:33.4;  Surgeon: Galen Manila, MD;  Location: [facility];  Service: Ophthalmology;  Laterality: Right;  SLEP APNEA-CPAP   CHOLECYSTECTOMY     COLONOSCOPY     2006, 2009, 2014, 2019   COLONOSCOPY WITH PROPOFOL N/A 10/16/2017   Procedure: COLONOSCOPY WITH PROPOFOL;  Surgeon: Scot Jun, MD;  Location: [facility] ENDOSCOPY;  Service: Endoscopy;  Laterality: N/A;   ESOPHAGOGASTRODUODENOSCOPY     2006, 2014, 2015,2019   ESOPHAGOGASTRODUODENOSCOPY (EGD) WITH PROPOFOL N/A 10/16/2017   Procedure: ESOPHAGOGASTRODUODENOSCOPY (EGD) WITH PROPOFOL;  Surgeon: Scot Jun, MD;  Location: [facility] ENDOSCOPY;  Service: Endoscopy;  Laterality: N/A;   EUS N/A 08/09/2022   Procedure: UPPER ENDOSCOPIC ULTRASOUND (EUS) LINEAR;  Surgeon: Bearl Mulberry, MD;  Location: [facility] ENDOSCOPY;  Service: Gastroenterology;  Laterality: N/A;   FUNCTIONAL ENDOSCOPIC SINUS SURGERY     JOINT REPLACEMENT     KNEE ARTHROSCOPY Right 11/30/2002   LUMBAR DISC SURGERY     ruptured disc x 2   TONSILLECTOMY AND ADENOIDECTOMY     TOTAL KNEE ARTHROPLASTY Right 10/25/2020   Procedure: TOTAL KNEE ARTHROPLASTY - [place] Neighbor to assist;  Surgeon: Kennedy Bucker, MD;  Location: [facility] ORS;  Service: Orthopedics;  Laterality: Right;   TOTAL KNEE ARTHROPLASTY Left 07/06/2021   Procedure: TOTAL KNEE ARTHROPLASTY;  Surgeon: Kennedy Bucker, MD;  Location: [facility] ORS;  Service: Orthopedics;  Laterality: Left;   VIDEO BRONCHOSCOPY WITH ENDOBRONCHIAL ULTRASOUND N/A 06/20/2022   Procedure: VIDEO BRONCHOSCOPY WITH ENDOBRONCHIAL ULTRASOUND;  Surgeon: Vida Rigger, MD;  Location: [facility] ORS;  Service: Thoracic;  Laterality: N/A;    Allergies: Albuterol, Beclomethasone, Sulfa antibiotics, Lodine [etodolac], Latex, and Meloxicam  Medications: Prior to Admission medications   Medication Sig Start Date End Date Taking? Authorizing Provider   albuterol (VENTOLIN HFA) 108 (90 Base) MCG/ACT inhaler SMARTSIG:2 Puff(s) Via Inhaler Every 6-8 Hours PRN 05/06/22  Yes [provider]  beclomethasone (QVAR) 80 MCG/ACT inhaler Inhale 2 puffs into the lungs 2 (two) times daily as needed (shortness of breath).   Yes [provider]  benzonatate (TESSALON) 200 MG capsule Take 200 mg by mouth 2 (two) times daily as needed. 05/15/22  Yes [provider]  losartan (COZAAR) 50 MG tablet Take 1 tablet (50 mg total) by mouth daily. 01/03/22  Yes Bosie Clos, MD  montelukast (SINGULAIR) 10 MG tablet TAKE 1 TABLET (10 MG TOTAL) BY MOUTH DAILY. 02/08/16  Yes Mungal, Vishal, MD  Multiple Vitamins-Minerals (PRESERVISION AREDS 2 PO) Take 1 tablet by mouth in the morning and at bedtime.   Yes [provider]  pantoprazole (PROTONIX) 40 MG tablet Take 1 tablet (40 mg total) by mouth 2 (two) times daily. 03/19/22  Yes Simmons-Robinson, Tawanna Cooler, MD  predniSONE (DELTASONE) 20 MG tablet Take 10 mg by mouth daily with breakfast. 08/07/22  Yes [provider]  triamcinolone ointment (KENALOG) 0.1 % Apply topically 2 (two) times daily as needed. 12/19/21  Yes [provider]  Vitamin D, Ergocalciferol, (DRISDOL) 1.25 MG (50000 UNIT) CAPS capsule TAKE 1 CAPSULE (50,000 UNITS TOTAL) BY MOUTH EVERY 7 (SEVEN) DAYS. (TAKING ONE TABLET PER WEEK) Patient taking differently: Take 50,000 Units by mouth every 7 (seven) days. Monday 11/07/20  Yes Bosie Clos, MD     Family History  Problem Relation Age of Onset   Breast cancer Mother 70   Heart attack Father    Heart attack Brother     Social History   Socioeconomic History   Marital status: Married    Spouse name: Ethelene Browns   Number of children: Not on file   Years of education: Not on file   Highest education level: Not on file  Occupational History   Occupation: variety of office jobs    Comment: retired  Tobacco Use   Smoking status: Never   Smokeless  tobacco: Never   Tobacco comments:    former social smoker  Building services engineer Use: Never used  Substance and Sexual Activity   Alcohol use: No    Alcohol/week: 0.0 standard drinks of alcohol   Drug use: No   Sexual activity: Yes    Birth control/protection: Post-menopausal  Other Topics Concern   Not on file  Social History Narrative   Patient lives with husband and an older son.   Patient feels safe in home. Son has Muscular dystrophy and they have a ramp.   Social Determinants of Health   Financial Resource Strain: Not on file  Food Insecurity: No Food Insecurity (06/04/2022)   Hunger Vital Sign    Worried About Running Out of Food in the Last Year: Never true    Ran Out of Food in the Last Year: Never true  Transportation Needs: No Transportation Needs (06/04/2022)   PRAPARE - Administrator, Civil Service (Medical): No    Lack of Transportation (  Non-Medical): No  Physical Activity: Inactive (12/20/2021)   Exercise Vital Sign    Days of Exercise per Week: 0 days    Minutes of Exercise per Session: 0 min  Stress: No Stress Concern Present (12/20/2021)   [employer] of Occupational Health - Occupational Stress Questionnaire    Feeling of Stress : Not at all  Social Connections: Socially Integrated (12/20/2021)   Social Connection and Isolation Panel [NHANES]    Frequency of Communication with Friends and Family: More than three times a week    Frequency of Social Gatherings with Friends and Family: More than three times a week    Attends Religious Services: More than 4 times per year    Active Member of [employer] or Organizations: Yes    Attends Engineer, structural: More than 4 times per year    Marital Status: Married    Review of Systems: A 12 point ROS discussed and pertinent positives are indicated in the HPI above.  All other systems are negative.  Review of Systems  All other systems reviewed and are negative.   Vital Signs: BP (!) 150/85    Pulse 85   Temp (!) 96.9 F (36.1 C)   Resp (!) 28   Ht 5\' 5"  (1.651 m)   Wt 240 lb (108.9 kg)   SpO2 98%   BMI 39.94 kg/m     Physical Exam Vitals reviewed.  Constitutional:      General: She is not in acute distress.    Appearance: She is not ill-appearing.  HENT:     Head: Normocephalic and atraumatic.     Mouth/Throat:     Mouth: Mucous membranes are dry.     Pharynx: Oropharynx is clear.  Eyes:     Extraocular Movements: Extraocular movements intact.     Pupils: Pupils are equal, round, and reactive to light.  Cardiovascular:     Rate and Rhythm: Normal rate and regular rhythm.  Pulmonary:     Effort: Pulmonary effort is normal. No respiratory distress.     Breath sounds: Wheezing present.     Comments: Expiratory wheezing RUL. Diminished sounds LUL. Skin:    General: Skin is warm and dry.  Neurological:     Mental Status: She is alert.  Psychiatric:        Mood and Affect: Mood normal.        Behavior: Behavior normal.     Imaging: DG C-Arm 1-60 Min-No Report  Result Date: 08/29/2022 Fluoroscopy was utilized by the requesting physician.  No radiographic interpretation.   CT SUPER D CHEST WO MONARCH PILOT  Result Date: 08/28/2022 CLINICAL DATA:  Preprocedural bronchoscopy EXAM: CT CHEST WITHOUT CONTRAST TECHNIQUE: Multidetector CT imaging of the chest was performed using thin slice collimation for electromagnetic bronchoscopy planning purposes, without intravenous contrast. RADIATION DOSE REDUCTION: This exam was performed according to the departmental dose-optimization program which includes automated exposure control, adjustment of the mA and/or kV according to patient size and/or use of iterative reconstruction technique. COMPARISON:  CT 06/19/2022. FINDINGS: Cardiovascular: On this non IV contrast exam, heart is borderline in size. Coronary artery calcifications are seen. Calcifications along the mitral valve annulus. The thoracic aorta has a normal course  and caliber. There is some enlargement of the pulmonary arteries. Please correlate for pulmonary artery hypertension. Mediastinum/Nodes: No specific abnormal lymph node enlargement identified in the axillary region, hilum or mediastinum on this noncontrast examination. Previous lymph node right paratracheal region the med 7 mm in diameter measurement,  today is smaller. Slightly patulous thoracic esophagus. Stable thyroid gland. Lungs/Pleura: Extensive breathing motion. No consolidation or pneumothorax. Diffuse patchy ground-glass and mosaic pattern to the lungs. Multiple nodules are once again identified and appear to be decreasing from the previous exam. Specific lesions will be followed for continuity. Example left upper lobe lesion which measured 17 x 13 mm, today on series 3, image 40 measures 10 by 6 mm. Middle lobe lesion which previously measured 18 x 16 mm, today on series 3, image 72 measures 12 x 9 mm. Left lower lobe lesion which previously measured 2.1 x 1.5 cm, today on series 3, image 105 measures 11 x 7 mm. Right lower lobe lesion anteriorly which measured 15 x 14 mm, today on series 3, image 67 measures 9 by 8 mm. Many of the nodules are more ill-defined today. There are some lesions which are similar in size to the prior. Upper Abdomen: Along the upper abdomen the adrenal glands are preserved. The spleen is enlarged. Nodular liver. Varices. Musculoskeletal: Curvature of the thoracic spine with degenerative changes. Posterior subdermal nodule on series 2, image 17 consistent with a sebaceous cyst. IMPRESSION: Numerous bilateral lung nodules are again seen but are more ill-defined today and many of which are smaller. Heterogeneous appearance of the lungs with ground-glass and mosaic pattern. No pleural effusion or pneumothorax. No abnormal lymph node enlargement identified. A few prior lymph nodes are further smaller today. Changes again of chronic liver disease in the upper abdomen with a nodular  liver, varices and splenomegaly. Aortic Atherosclerosis (ICD10-I70.0). Electronically Signed   By: Karen Kays M.D.   On: 08/28/2022 16:23    Labs:  CBC: Recent Labs    10/18/21 1112 06/04/22 1436 06/18/22 0902  WBC 5.7 6.8 4.8  HGB 12.2 12.4 11.4*  HCT 38.1 38.4 36.3  PLT 198 243 179    COAGS: Recent Labs    06/18/22 0902  INR 1.1  APTT 35    BMP: Recent Labs    10/18/21 1112 06/04/22 1436  NA 143 136  K 4.2 4.0  CL 105 103  CO2 24 25  GLUCOSE 91 95  BUN 10 16  CALCIUM 9.2 8.8*  CREATININE 0.80 1.10*  GFRNONAA  --  55*    LIVER FUNCTION TESTS: Recent Labs    10/18/21 1112 06/04/22 1436  BILITOT 0.9 1.1  AST 20 20  ALT 14 9  ALKPHOS 113 84  PROT 6.3 6.9  ALBUMIN 3.6* 3.1*    TUMOR MARKERS: No results for input(s): "AFPTM", "CEA", "CA199", "CHROMGRNA" in the last 8760 hours.  Assessment and Plan:  67 year old female with PMHx significant for asthma, dyspnea, gastric ulcer, GERD, HLD, HTN, IDA, OSA and pulmonary nodule presents to IR for emergent left chest tube placement for large left pneumothorax status post bronchoscopy today.  Patient resting on stretcher. She is alert, calm and pleasant. She is in no distress. Patient's husband provided consent due to anesthesia given earlier today.  Risks and benefits of left chest tube placement discussed with the patient's husband including bleeding, infection, damage to adjacent structures and sepsis.  All of the patient's husband's questions were answered, and is agreeable to proceed.  Consent signed and in chart.  Thank you for this interesting consult.  I greatly enjoyed meeting Sarah Marquez and look forward to participating in their care.  A copy of this report was sent to the requesting provider on this date.  Electronically Signed: Shon Hough, NP 08/29/2022, 3:47 PM  I spent a total of 20 minutes in face to face in clinical consultation, greater than 50% of which was  counseling/coordinating care for large left pneumothorax status post bronchoscopy.

## 2022-08-29 NOTE — Progress Notes (Signed)
Patient stable. VSS. No c/o SOB or chest pain. O2 simple face mask on 6L/min. Chest xray shows pneumotharax. Dr Karna Christmas aware of and proceed with intervention.

## 2022-08-29 NOTE — Transfer of Care (Signed)
Immediate Anesthesia Transfer of Care Note  Patient: Sarah Marquez  Procedure(s) Performed: VIDEO BRONCHOSCOPY WITH ENDOBRONCHIAL ULTRASOUND ROBOTIC ASSISTED NAVIGATIONAL BRONCHOSCOPY  Patient Location: PACU  Anesthesia Type:General  Level of Consciousness: awake, alert , and oriented  Airway & Oxygen Therapy: Patient Spontanous Breathing  Post-op Assessment: Report given to RN and Post -op Vital signs reviewed and stable  Post vital signs: Reviewed and stable  Last Vitals:  Vitals Value Taken Time  BP 133/84 08/29/22 1520  Temp    Pulse 85 08/29/22 1525  Resp 0 08/29/22 1525  SpO2 96 % 08/29/22 1525  Vitals shown include unvalidated device data.  Last Pain:  Vitals:   08/29/22 1106  TempSrc: Temporal  PainSc: 0-No pain         Complications: No notable events documented.

## 2022-08-29 NOTE — Progress Notes (Signed)
Returned to PACU. Chest tube placed left upper chest to suction. VSS. Sats 95% on 3L/min. No c/o chest pain or SOB.

## 2022-08-29 NOTE — Hospital Course (Addendum)
Sarah Marquez is a 67 y.o. female w/ PMH chronic moderate persistent asthma, osteoarthritis of the neck, coronary artery disease history of skin basal cell carcinoma, carpal tunnel syndrome, colonic polyps, dyspnea on exertion, GERD, gout, dyslipidemia, essential hypertension, iron deficiency, obstructive sleep apnea, vitamin D deficiency. She was seen today by Dr Karna Christmas for bronchoscopy / evaluation hypermetabolic lung nodules (to ascertain cancer vs vasculitis).  05/01: bronchoscopy was successful but complicated by L pneumothorax. Pt stable on 3L O2. IR placed pleural drain. Hospitalist consulted for admission. 05/02: CXR AM shows reexpansion L lung except small PTX L thoracic margin no more than 10% volume. Off O2. Clamped chest tube, plan CXR tomorrow  05/03: recurrence PTX, chest tube back to suction, lung reinflated, pulmonary following, likely will keep here thru the weekend to allow resolution bronchopleural fistula, repeat CXR later today and tomorrow AM.  05/04: lung sounds present all fields, no SOB, doing well. CXR no PTX, L chest tube in place and clamped today by Dr Karna Christmas 05/05: CXR this am no concerns. Chest tube unable to be removed, d/t IR not available over the weekend in case needing replaced. Hopefully will be able to remove and discharge tomorrow. Remains clamped for now.   Consultants:  Pulmonary  IR  Procedures: 08/29/22 bronchoscopy w/ transbronchial and endobronchial biopsies for histologic evaluation, as well as therapeutic lavage/aspiration LUL mucus plugging,  08/29/22 placement L pigtail chest tube catheter       ASSESSMENT & PLAN:   Active Problems:             L pneumothorax             Lung mass uncertain etiology  Chronic Problems:             Essential HTN             GERD             Moderate Persistent Asthma              HLD             OSA                L pneumothorax, recurrent Chest tube in place, remove tomorrow (IR not here  today) pulmonary following Follow serial CXR O2 support prn - has not needed this    Lung mass uncertain etiology  Pending biopsy results --> follow outpatient    Essential HTN Cont home losartan   GERD Cont home PPI   Moderate Persistent Asthma  Albuterol prn    HLD Follow outpatient   OSA CPAP qhs          DVT prophylaxis: lovenox  Pertinent IV fluids/nutrition: no continuous IV fluids, regular diet  Central lines / invasive devices: L chest tube   Code Status: FULL CODE ACP documentation reviewed: 05/01, none on file in [facility]   Current Admission Status: inpatient  TOC needs / Dispo plan: none Barriers to discharge / significant pending items: will be here thru the weekend penidng perisstent resolution PTX

## 2022-08-29 NOTE — Progress Notes (Signed)
Out of recovery to radiology/CT. Stable

## 2022-08-29 NOTE — Procedures (Signed)
ROBOTIC NAVIGATIONAL BRONCHOSCOPY PROCEDURE NOTE  FIBEROPTIC BRONCHOSCOPY WITH BRONCHOALVEOLAR LAVAGE AND THERAPEUTIC ASPIRATION OF TRACHEOBRONCHIAL TREE PROCEDURE NOTE  ENDOBRONCHIAL ULTRASOUND PROCEDURE NOTE    Flexible bronchoscopy was performed  by : Karna Christmas MD  assistance by : 1)Repiratory therapist  and 2)[employer] staff and 3) Anesthesia team and 4) Flouroscopy team and 5) [facility] Staff   Indication for the procedure was :  Pre-procedural H&P. The following assessment was performed on the day of the procedure prior to initiating sedation History:  Chest pain n Dyspnea y Hemoptysis n Cough y Fever n Other pertinent items n  Examination Vital signs -reviewed as per nursing documentation today Cardiac    Murmurs: n  Rubs : n  Gallop: n Lungs Wheezing: n Rales : n Rhonchi :y  Other pertinent findings: SOB/hypoxemia due to chronic lung disease   Pre-procedural assessment for Procedural Sedation included: Depth of sedation: As per anesthesia team  ASA Classification:  2 Mallampati airway assessment: 3    Medication list reviewed: y  The patient's interval history was taken and revealed: no new complaints The pre- procedure physical examination revealed: No new findings Refer to prior clinic note for details.  Informed Consent: Informed consent was obtained from:  patient after explanation of procedure and risks, benefits, as well as alternative procedures available.  Explanation of level of sedation and possible transfusion was also provided.    Procedural Preparation: Time out was performed and patient was identified by name and birthdate and procedure to be performed and side for sampling, if any, was specified. Pt was intubated by anesthesia.  The patient was appropriately draped.   Fiberoptic bronchoscopy with airway inspection and BAL Procedure findings:  Bronchoscope was inserted via ETT  without difficulty.  Posterior oropharynx,  epiglottis, arytenoids, false cords and vocal cords were not visualized as these were bypassed by endotracheal tube. The distal trachea was normal in circumference and appearance without mucosal, cartilaginous or branching abnormalities.  The main carina was mildly splayed . All right and left lobar airways were visualized to the Subsegmental level.  Sub- sub segmental carinae were identified in all the distal airways.   Secretions were visible in the following airways and appeared to be clear.  The mucosa was : friable at RUL  Airways were notable for:        exophytic lesions :n       extrinsic compression in the following distributions: n.       Friable mucosa: at left upper lobe       Anthrocotic material /pigmentation: n     Post procedure Diagnosis:   Mucus plugging noted at LUL which was evacuated and therapeutic aspiration of tracheobronchial tree performed prior to starting Monarch portion.   BAL x 2 performed at left upper lobe sent for micriobiology     Electromagnetic Navigational Bronchoscopy Procedure Findings:    Post appropriate planning and registration robotic navigation was used to visualize target lesion.    Post procedure diagnosis: denuded left upper lobe carina with friable mucosa biopsied with FNA x 3 (transbronchial), Surgical biopsy performed with 5 endobronchial biopsies and 4 trans bronchial biopsies      Endobronchial ultrasound assisted hilar and mediastinal lymph node biopsies procedure findings: The fiberoptic bronchoscope was removed and the EBUS scope was introduced. Examination began to evaluate for pathologically enlarged lymph nodes starting on the left side progressing to the rightside.  All lymph node appeared sub-centimeter with healthy fatty core and were not biopsied for this reason. Lymph  node biopsies were not performed   Post procedure diagnosis:  normal hilar and mediastinal lymph nodes   Specimens obtained  included:  Broncho-alveolar lavage site:LUL   sent for microbiology                        90 ml volume infused 45 ml volume returned with cellular appearance  Endobronchial biopsy site:  LUL ; sent for histology                                 Transbronchial biopsy site: LUL ; sent for histology     Fluoroscopy Used: yes ;        Pictorial documentation attached: none                 Transbronchial WANG needle aspiration site: yes  sent for: LUL sent for histology                                     Immediate sampling complications included:none  Epinephrine ZERO ml was used topically  The bronchoscopy was terminated due to completion of the planned procedure and the bronchoscope was removed.   Total dosage of Lidocaine was ZERO mg Total fluoroscopy time was AS PER RADIOLOGY  minutes  Supplemental oxygen was provided at AS PER ANESTHESIA lpm by nasal canula post operatively  Estimated Blood loss: <10 EXPECTED cc.  Complications included:  NONE IMMEDIATE  Preliminary CXR findings :  IN PROCESS  Disposition: HOME WITH HUSBAND  Follow up with Dr. Karna Christmas in 5 days for result discussion.     Vida Rigger MD  [facility] Division of Pulmonary & Critical Care Medicine

## 2022-08-30 ENCOUNTER — Observation Stay: Payer: PPO

## 2022-08-30 ENCOUNTER — Encounter: Payer: Self-pay | Admitting: Pulmonary Disease

## 2022-08-30 DIAGNOSIS — Z8249 Family history of ischemic heart disease and other diseases of the circulatory system: Secondary | ICD-10-CM | POA: Diagnosis not present

## 2022-08-30 DIAGNOSIS — T17890A Other foreign object in other parts of respiratory tract causing asphyxiation, initial encounter: Secondary | ICD-10-CM | POA: Diagnosis present

## 2022-08-30 DIAGNOSIS — K219 Gastro-esophageal reflux disease without esophagitis: Secondary | ICD-10-CM | POA: Diagnosis present

## 2022-08-30 DIAGNOSIS — I10 Essential (primary) hypertension: Secondary | ICD-10-CM | POA: Diagnosis present

## 2022-08-30 DIAGNOSIS — M47812 Spondylosis without myelopathy or radiculopathy, cervical region: Secondary | ICD-10-CM | POA: Diagnosis present

## 2022-08-30 DIAGNOSIS — J8410 Pulmonary fibrosis, unspecified: Secondary | ICD-10-CM | POA: Diagnosis present

## 2022-08-30 DIAGNOSIS — E785 Hyperlipidemia, unspecified: Secondary | ICD-10-CM | POA: Diagnosis present

## 2022-08-30 DIAGNOSIS — Z1152 Encounter for screening for COVID-19: Secondary | ICD-10-CM | POA: Diagnosis not present

## 2022-08-30 DIAGNOSIS — I2510 Atherosclerotic heart disease of native coronary artery without angina pectoris: Secondary | ICD-10-CM | POA: Diagnosis present

## 2022-08-30 DIAGNOSIS — D509 Iron deficiency anemia, unspecified: Secondary | ICD-10-CM | POA: Diagnosis present

## 2022-08-30 DIAGNOSIS — Z96653 Presence of artificial knee joint, bilateral: Secondary | ICD-10-CM | POA: Diagnosis present

## 2022-08-30 DIAGNOSIS — Z6839 Body mass index (BMI) 39.0-39.9, adult: Secondary | ICD-10-CM | POA: Diagnosis not present

## 2022-08-30 DIAGNOSIS — Z87891 Personal history of nicotine dependence: Secondary | ICD-10-CM | POA: Diagnosis not present

## 2022-08-30 DIAGNOSIS — Z85828 Personal history of other malignant neoplasm of skin: Secondary | ICD-10-CM | POA: Diagnosis not present

## 2022-08-30 DIAGNOSIS — G4733 Obstructive sleep apnea (adult) (pediatric): Secondary | ICD-10-CM | POA: Diagnosis present

## 2022-08-30 DIAGNOSIS — J95811 Postprocedural pneumothorax: Secondary | ICD-10-CM | POA: Diagnosis present

## 2022-08-30 DIAGNOSIS — Z803 Family history of malignant neoplasm of breast: Secondary | ICD-10-CM | POA: Diagnosis not present

## 2022-08-30 DIAGNOSIS — J939 Pneumothorax, unspecified: Secondary | ICD-10-CM | POA: Diagnosis not present

## 2022-08-30 DIAGNOSIS — Y848 Other medical procedures as the cause of abnormal reaction of the patient, or of later complication, without mention of misadventure at the time of the procedure: Secondary | ICD-10-CM | POA: Diagnosis present

## 2022-08-30 DIAGNOSIS — J4540 Moderate persistent asthma, uncomplicated: Secondary | ICD-10-CM | POA: Diagnosis present

## 2022-08-30 DIAGNOSIS — E559 Vitamin D deficiency, unspecified: Secondary | ICD-10-CM | POA: Diagnosis present

## 2022-08-30 DIAGNOSIS — J8489 Other specified interstitial pulmonary diseases: Secondary | ICD-10-CM | POA: Diagnosis present

## 2022-08-30 DIAGNOSIS — Z8616 Personal history of COVID-19: Secondary | ICD-10-CM | POA: Diagnosis not present

## 2022-08-30 DIAGNOSIS — Z7951 Long term (current) use of inhaled steroids: Secondary | ICD-10-CM | POA: Diagnosis not present

## 2022-08-30 DIAGNOSIS — M109 Gout, unspecified: Secondary | ICD-10-CM | POA: Diagnosis present

## 2022-08-30 DIAGNOSIS — R918 Other nonspecific abnormal finding of lung field: Secondary | ICD-10-CM | POA: Diagnosis present

## 2022-08-30 LAB — BASIC METABOLIC PANEL
Anion gap: 8 (ref 5–15)
BUN: 25 mg/dL — ABNORMAL HIGH (ref 8–23)
CO2: 25 mmol/L (ref 22–32)
Calcium: 9 mg/dL (ref 8.9–10.3)
Chloride: 105 mmol/L (ref 98–111)
Creatinine, Ser: 0.92 mg/dL (ref 0.44–1.00)
GFR, Estimated: >60 mL/min (ref >60)
Glucose, Bld: 118 mg/dL — ABNORMAL HIGH (ref 70–99)
Potassium: 4.1 mmol/L (ref 3.5–5.1)
Sodium: 138 mmol/L (ref 135–145)

## 2022-08-30 LAB — CBC
HCT: 37.1 % (ref 36.0–46.0)
Hemoglobin: 11.7 g/dL — ABNORMAL LOW (ref 12.0–15.0)
MCH: 24.9 pg — ABNORMAL LOW (ref 26.0–34.0)
MCHC: 31.5 g/dL (ref 30.0–36.0)
MCV: 79.1 fL — ABNORMAL LOW (ref 80.0–100.0)
Platelets: 193 10*3/uL (ref 150–400)
RBC: 4.69 MIL/uL (ref 3.87–5.11)
RDW: 15.8 % — ABNORMAL HIGH (ref 11.5–15.5)
WBC: 9.9 10*3/uL (ref 4.0–10.5)
nRBC: 0 % (ref 0.0–0.2)

## 2022-08-30 LAB — CULTURE, BAL-QUANTITATIVE W GRAM STAIN

## 2022-08-30 LAB — CYTOLOGY - NON PAP

## 2022-08-30 MED ORDER — ALBUTEROL SULFATE (2.5 MG/3ML) 0.083% IN NEBU: 2.5000 mg | INHALATION_SOLUTION | Freq: Four times a day (QID) | RESPIRATORY_TRACT | Status: DC | PRN

## 2022-08-30 NOTE — Progress Notes (Signed)
PULMONOLOGY         Date: 08/30/2022,   MRN# [id number] Sarah Marquez 1956-03-03     AdmissionWeight: 108.9 kg                 CurrentWeight: 108.9 kg  Referring provider: Dr Jenne Campus   CHIEF COMPLAINT:   Hypermetabolic lung nodules   HISTORY OF PRESENT ILLNESS   67 year old female with a history of chronic moderate persistent asthma, osteoarthritis of the neck, coronary artery disease history of skin basal cell carcinoma, carpal tunnel syndrome, colonic polyps, dyspnea on exertion, GERD, gout, dyslipidemia, essential hypertension, iron deficiency, obstructive sleep apnea, vitamin D deficiency for reevaluation of hypermetabolic lung nodules.  Specifically question remains if this possibly cancer versus vasculitis as therapy is quite different.  Patient did report improvement after prednisone however this therapy is generally temporary and patient needs definitive treatment whether this is a vasculitis or not.  Additionally any mucous plugging will be removed via aspiration, bronchoalveolar lavage will be performed for additional microbiology transbronchial and endobronchial biopsies will be performed for histologic evaluation for atypia including carcinoma as well as organizing pneumonia versus vasculitis.  -08/30/22- patient developed pneumothorax on left after FNA during bronch yesterday.  She is on room air with only 10%..  She is doing well.  Mild wheezing , have ordered albuterol.  I have clamped chest tube and repeat cxr has been ordered.    PAST MEDICAL HISTORY   Past Medical History:  Diagnosis Date   Arthritis    neck, knees   Asthma    Calcification of right carotid artery    Cancer of the skin, basal cell    a.) s/p excision from neck, back, and foot   Carpal tunnel syndrome    Colon polyps    Difficult intubation    Dyspnea    Gastric ulcer    GERD (gastroesophageal reflux disease)    Gout    History of 2019 novel coronavirus disease (COVID-19)  10/21/2020   Hyperlipidemia    Hypertension    Iron deficiency    Iron deficiency anemia    OSA (obstructive sleep apnea)    a.) does not require nocturnal PAP therapy   Pulmonary nodule    Vertigo    last episode 12/2018   Vitamin D deficiency    Wears dentures    partial upper     SURGICAL HISTORY   Past Surgical History:  Procedure Laterality Date   ABDOMINAL HYSTERECTOMY     APPENDECTOMY     BACK SURGERY     x2 L5 FOR RUPTURED DISC   CARDIAC CATHETERIZATION  1990's   CATARACT EXTRACTION W/PHACO Left 04/07/2019   Procedure: CATARACT EXTRACTION PHACO AND INTRAOCULAR LENS PLACEMENT (IOC) LEFT 4.43, 00:34.1;  Surgeon: Galen Manila, MD;  Location: [facility];  Service: Ophthalmology;  Laterality: Left;  Sleep Apnea-does not wear CPAP Latex-adhesives   CATARACT EXTRACTION W/PHACO Right 05/05/2019   Procedure: CATARACT EXTRACTION PHACO AND INTRAOCULAR LENS PLACEMENT (IOC) RIGHT;  4.83, 00:33.4;  Surgeon: Galen Manila, MD;  Location: [facility];  Service: Ophthalmology;  Laterality: Right;  SLEP APNEA-CPAP   CHOLECYSTECTOMY     COLONOSCOPY     2006, 2009, 2014, 2019   COLONOSCOPY WITH PROPOFOL N/A 10/16/2017   Procedure: COLONOSCOPY WITH PROPOFOL;  Surgeon: Scot Jun, MD;  Location: [facility];  Service: Endoscopy;  Laterality: N/A;   ESOPHAGOGASTRODUODENOSCOPY     2006, 2014, 2015,2019   ESOPHAGOGASTRODUODENOSCOPY (EGD) WITH PROPOFOL N/A  10/16/2017   Procedure: ESOPHAGOGASTRODUODENOSCOPY (EGD) WITH PROPOFOL;  Surgeon: Scot Jun, MD;  Location: [facility] END[facility]COPY;  Service: Endoscopy;  Laterality: N/A;   EUS N/A 08/09/2022   Procedure: UPPER ENDOSCOPIC ULTRASOUND (EUS) LINEAR;  Surgeon: Bearl Mulberry, MD;  Location: [facility] ENDOSCOPY;  Service: Gastroenterology;  Laterality: N/A;   FUNCTIONAL ENDOSCOPIC SINUS SURGERY     JOINT REPLACEMENT     KNEE ARTHROSCOPY Right 11/30/2002   LUMBAR DISC SURGERY     ruptured disc x 2    TONSILLECTOMY AND ADENOIDECTOMY     TOTAL KNEE ARTHROPLASTY Right 10/25/2020   Procedure: TOTAL KNEE ARTHROPLASTY - [place] Neighbor to assist;  Surgeon: Kennedy Bucker, MD;  Location: [facility];  Service: Orthopedics;  Laterality: Right;   TOTAL KNEE ARTHROPLASTY Left 07/06/2021   Procedure: TOTAL KNEE ARTHROPLASTY;  Surgeon: Kennedy Bucker, MD;  Location: [facility];  Service: Orthopedics;  Laterality: Left;   VIDEO BRONCHOSCOPY WITH ENDOBRONCHIAL ULTRASOUND N/A 06/20/2022   Procedure: VIDEO BRONCHOSCOPY WITH ENDOBRONCHIAL ULTRASOUND;  Surgeon: Vida Rigger, MD;  Location: [facility];  Service: Thoracic;  Laterality: N/A;   VIDEO BRONCHOSCOPY WITH ENDOBRONCHIAL ULTRASOUND N/A 08/29/2022   Procedure: VIDEO BRONCHOSCOPY WITH ENDOBRONCHIAL ULTRASOUND;  Surgeon: Vida Rigger, MD;  Location: [facility];  Service: Thoracic;  Laterality: N/A;     FAMILY HISTORY   Family History  Problem Relation Age of Onset   Breast cancer Mother 67   Heart attack Father    Heart attack Brother      SOCIAL HISTORY   Social History   Tobacco Use   Smoking status: Never   Smokeless tobacco: Never   Tobacco comments:    former social smoker  Vaping Use   Vaping Use: Never used  Substance Use Topics   Alcohol use: No    Alcohol/week: 0.0 standard drinks of alcohol   Drug use: No     MEDICATIONS    Home Medication:    Current Medication:  Current Facility-Administered Medications:    benzonatate (TESSALON) capsule 200 mg, 200 mg, Oral, BID PRN, Sunnie Nielsen, DO   bisacodyl (DULCOLAX) EC tablet 5 mg, 5 mg, Oral, Daily PRN, Sunnie Nielsen, DO   enoxaparin (LOVENOX) injection 40 mg, 40 mg, Subcutaneous, Q24H, Alexander, Natalie, DO   losartan (COZAAR) tablet 50 mg, 50 mg, Oral, Daily, Sunnie Nielsen, DO, 50 mg at 08/29/22 2037   montelukast (SINGULAIR) tablet 10 mg, 10 mg, Oral, Daily, Sunnie Nielsen, DO, 10 mg at 08/29/22 2037   morphine (PF) 2 MG/ML injection 2 mg, 2 mg,  Intravenous, Q2H PRN, Sunnie Nielsen, DO   multivitamin with minerals tablet 1 tablet, 1 tablet, Oral, Daily, Sunnie Nielsen, DO, 1 tablet at 08/29/22 2037   ondansetron (ZOFRAN) tablet 4 mg, 4 mg, Oral, Q6H PRN **OR** ondansetron (ZOFRAN) injection 4 mg, 4 mg, Intravenous, Q6H PRN, Sunnie Nielsen, DO   oxyCODONE (Oxy IR/ROXICODONE) immediate release tablet 5 mg, 5 mg, Oral, Q4H PRN, Sunnie Nielsen, DO   pantoprazole (PROTONIX) EC tablet 40 mg, 40 mg, Oral, BID, Sunnie Nielsen, DO, 40 mg at 08/29/22 2036   senna-docusate (Senokot-S) tablet 1 tablet, 1 tablet, Oral, QHS PRN, Sunnie Nielsen, DO   traZODone (DESYREL) tablet 25 mg, 25 mg, Oral, QHS PRN, Sunnie Nielsen, DO    ALLERGIES   Albuterol, Beclomethasone, Sulfa antibiotics, Lodine [etodolac], Latex, and Meloxicam     REVIEW OF SYSTEMS    Review of Systems:  Gen:  Denies  fever, sweats, chills weigh loss  HEENT: Denies blurred vision, double vision, ear  pain, eye pain, hearing loss, nose bleeds, sore throat Cardiac:  No dizziness, chest pain or heaviness, chest tightness,edema Resp:   reports dyspnea chronically  Gi: Denies swallowing difficulty, stomach pain, nausea or vomiting, diarrhea, constipation, bowel incontinence Gu:  Denies bladder incontinence, burning urine Ext:   Denies Joint pain, stiffness or swelling Skin: Denies  skin rash, easy bruising or bleeding or hives Endoc:  Denies polyuria, polydipsia , polyphagia or weight change Psych:   Denies depression, insomnia or hallucinations   Other:  All other systems negative   VS: BP 120/82 (BP Location: Left Arm)   Pulse 64   Temp 97.7 F (36.5 C) (Oral)   Resp 18   Ht 5\' 5"  (1.651 m)   Wt 108.9 kg   SpO2 97%   BMI 39.94 kg/m      PHYSICAL EXAM    GENERAL:NAD, no fevers, chills, no weakness no fatigue HEAD: Normocephalic, atraumatic.  EYES: Pupils equal, round, reactive to light. Extraocular muscles intact. No scleral icterus.   MOUTH: Moist mucosal membrane. Dentition intact. No abscess noted.  EAR, NOSE, THROAT: Clear without exudates. No external lesions.  NECK: Supple. No thyromegaly. No nodules. No JVD.  PULMONARY: decreased breath sounds with mild rhonchi worse at bases bilaterally.  CARDIOVASCULAR: S1 and S2. Regular rate and rhythm. No murmurs, rubs, or gallops. No edema. Pedal pulses 2+ bilaterally.  GASTROINTESTINAL: Soft, nontender, nondistended. No masses. Positive bowel sounds. No hepatosplenomegaly.  MUSCULOSKELETAL: No swelling, clubbing, or edema. Range of motion full in all extremities.  NEUROLOGIC: Cranial nerves II through XII are intact. No gross focal neurological deficits. Sensation intact. Reflexes intact.  SKIN: No ulceration, lesions, rashes, or cyanosis. Skin warm and dry. Turgor intact.  PSYCHIATRIC: Mood, affect within normal limits. The patient is awake, alert and oriented x 3. Insight, judgment intact.       IMAGING   Most recent CT and PET scan reviewed.   ASSESSMENT/PLAN   Bilateral pulmonary nodules  -s/p bronchoscopy with FNA induced left pneumothorax   Left secondary pneumothorax - chest tube management - no air leak, have clamped tube will repeat CXR in am. Patient on room air , asymptomatic      Thank you for allowing me to participate in the care of this patient.   Patient/Family are satisfied with care plan and all questions have been answered.    Provider disclosure: Patient with at least one acute or chronic illness or injury that poses a threat to life or bodily function and is being managed actively during this encounter.  All of the below services have been performed independently by signing provider:  review of prior documentation from internal and or external health records.  Review of previous and current lab results.  Interview and comprehensive assessment during patient visit today. Review of current and previous chest radiographs/CT scans. Discussion of  management and test interpretation with health care team and patient/family.   This document was prepared using Dragon voice recognition software and may include unintentional dictation errors.     Vida Rigger, M.D.  Division of Pulmonary & Critical Care Medicine

## 2022-08-30 NOTE — Progress Notes (Signed)
PROGRESS NOTE    Sarah Marquez   NWG:[id number] DOB: 1956-04-09  DOA: 08/29/2022 Date of Service: 08/30/22 PCP: Bosie Clos, MD     Brief Narrative / Hospital Course:  Sarah Marquez is a 67 y.o. female w/ PMH chronic moderate persistent asthma, osteoarthritis of the neck, coronary artery disease history of skin basal cell carcinoma, carpal tunnel syndrome, colonic polyps, dyspnea on exertion, GERD, gout, dyslipidemia, essential hypertension, iron deficiency, obstructive sleep apnea, vitamin D deficiency. She was seen today by Dr Karna Christmas for bronchoscopy / evaluation hypermetabolic lung nodules (to ascertain cancer vs vasculitis).  05/01: bronchoscopy was successful but complicated by L pneumothorax. Pt stable on 3L O2. IR placed pleural drain. Hospitalist consulted for admission. 05/02: CXR AM shows reexpansion L lung except small PTX L thoracic margin no more than 10% volume. Off O2. Clamped chest tube, plan CXR tomorrow   Consultants:  Pulmonary  IR  Procedures: 08/29/22 bronchoscopy w/ transbronchial and endobronchial biopsies for histologic evaluation, as well as therapeutic lavage/aspiration LUL mucus plugging,  08/29/22 placement L pigtail chest tube catheter       ASSESSMENT & PLAN:   Active Problems:             L pneumothorax             Lung mass uncertain etiology  Chronic Problems:             Essential HTN             GERD             Moderate Persistent Asthma              HLD             OSA                L pneumothorax Chest tube in place --> clamp today IR/pulmonary following CXR in AM today looked improved --> repeat tomorrow AM O2 support as needed    Lung mass uncertain etiology  Pending biopsy results   Essential HTN Cont home losartan   GERD Cont home PPI   Moderate Persistent Asthma  Albuterol prn    HLD Follow outpatient   OSA CPAP qhs          DVT prophylaxis: lovenox  Pertinent IV fluids/nutrition: no continuous  IV fluids, regular diet  Central lines / invasive devices: L chest tube   Code Status: FULL CODE ACP documentation reviewed: 05/01, none on filein [facility]   Current Admission Status: observation  TOC needs / Dispo plan: none Barriers to discharge / significant pending items: pending AM CXR and pulm/IR, may be able to remove chest tube tomorrow, but will change to inpatietn if needing to stay w/ chest tube longer                 Subjective / Brief ROS:  Patient reports feeling better today  Denies CP/SOB.  Pain controlled.  Denies new weakness.  Tolerating diet.  Reports no concerns w/ urination/defecation.   Family Communication: none at this time     Objective Findings:  Vitals:   08/29/22 1815 08/29/22 2038 08/30/22 0427 08/30/22 0920  BP: (!) 149/84 137/61 120/82 123/60  Pulse: 81 88 64 77  Resp: (!) 25  18 16   Temp:   97.7 F (36.5 C) 98 F (36.7 C)  TempSrc:   Oral   SpO2: 95% 96% 97% 94%  Weight:      Height:  Intake/Output Summary (Last 24 hours) at 08/30/2022 1503 Last data filed at 08/30/2022 1610 Gross per 24 hour  Intake 100 ml  Output 6 ml  Net 94 ml   Filed Weights   08/29/22 1106  Weight: 108.9 kg    Examination:  Physical Exam Constitutional:      General: She is not in acute distress. Cardiovascular:     Rate and Rhythm: Normal rate and regular rhythm.  Pulmonary:     Effort: Pulmonary effort is normal. No respiratory distress.     Breath sounds: Normal breath sounds.  Skin:    General: Skin is warm and dry.  Neurological:     General: No focal deficit present.     Mental Status: She is alert and oriented to person, place, and time.  Psychiatric:        Mood and Affect: Mood normal.        Behavior: Behavior normal.          Scheduled Medications:   enoxaparin (LOVENOX) injection  40 mg Subcutaneous Q24H   losartan  50 mg Oral Daily   montelukast  10 mg Oral Daily   multivitamin with minerals  1 tablet Oral  Daily   pantoprazole  40 mg Oral BID    Continuous Infusions:   PRN Medications:  albuterol, benzonatate, bisacodyl, morphine injection, ondansetron **OR** ondansetron (ZOFRAN) IV, oxyCODONE, senna-docusate, traZODone  Antimicrobials from admission:  Anti-infectives (From admission, onward)    None           Data Reviewed:  I have personally reviewed the following...  CBC: Recent Labs  Lab 08/29/22 1741 08/30/22 0550  WBC 6.8 9.9  HGB 13.0 11.7*  HCT 40.4 37.1  MCV 78.9* 79.1*  PLT 194 193   Basic Metabolic Panel: Recent Labs  Lab 08/29/22 1741 08/30/22 0550  NA  --  138  K  --  4.1  CL  --  105  CO2  --  25  GLUCOSE  --  118*  BUN  --  25*  CREATININE 1.12* 0.92  CALCIUM  --  9.0   GFR: Estimated Creatinine Clearance: 71.9 mL/min (by C-G formula based on SCr of 0.92 mg/dL). Liver Function Tests: No results for input(s): "AST", "ALT", "ALKPHOS", "BILITOT", "PROT", "ALBUMIN" in the last 168 hours. No results for input(s): "LIPASE", "AMYLASE" in the last 168 hours. No results for input(s): "AMMONIA" in the last 168 hours. Coagulation Profile: No results for input(s): "INR", "PROTIME" in the last 168 hours. Cardiac Enzymes: No results for input(s): "CKTOTAL", "CKMB", "CKMBINDEX", "TROPONINI" in the last 168 hours. BNP (last 3 results) No results for input(s): "PROBNP" in the last 8760 hours. HbA1C: No results for input(s): "HGBA1C" in the last 72 hours. CBG: No results for input(s): "GLUCAP" in the last 168 hours. Lipid Profile: No results for input(s): "CHOL", "HDL", "LDLCALC", "TRIG", "CHOLHDL", "LDLDIRECT" in the last 72 hours. Thyroid Function Tests: No results for input(s): "TSH", "T4TOTAL", "FREET4", "T3FREE", "THYROIDAB" in the last 72 hours. Anemia Panel: No results for input(s): "VITAMINB12", "FOLATE", "FERRITIN", "TIBC", "IRON", "RETICCTPCT" in the last 72 hours. Most Recent Urinalysis On File:     Component Value Date/Time    COLORURINE YELLOW (A) 06/22/2021 1019   APPEARANCEUR HAZY (A) 06/22/2021 1019   LABSPEC 1.016 06/22/2021 1019   PHURINE 6.0 06/22/2021 1019   GLUCOSEU NEGATIVE 06/22/2021 1019   HGBUR NEGATIVE 06/22/2021 1019   BILIRUBINUR NEGATIVE 06/22/2021 1019   BILIRUBINUR negative 02/12/2020 1035   KETONESUR NEGATIVE 06/22/2021 1019  PROTEINUR NEGATIVE 06/22/2021 1019   UROBILINOGEN 0.2 02/12/2020 1035   NITRITE NEGATIVE 06/22/2021 1019   LEUKOCYTESUR MODERATE (A) 06/22/2021 1019   Sepsis Labs: @LABRCNTIP (procalcitonin:4,lacticidven:4) Microbiology: Recent Results (from the past 240 hour(s))  SARS CORONAVIRUS 2 (TAT 6-24 HRS) Anterior Nasal Swab     Status: None   Collection Time: 08/27/22  3:25 PM   Specimen: Anterior Nasal Swab  Result Value Ref Range Status   SARS Coronavirus 2 NEGATIVE NEGATIVE Final    Comment: (NOTE) SARS-CoV-2 target nucleic acids are NOT DETECTED.  The SARS-CoV-2 RNA is generally detectable in upper and lower respiratory specimens during the acute phase of infection. Negative results do not preclude SARS-CoV-2 infection, do not rule out co-infections with other pathogens, and should not be used as the sole basis for treatment or other patient management decisions. Negative results must be combined with clinical observations, patient history, and epidemiological information. The expected result is Negative.  Fact Sheet for Patients: HairSlick.no  Fact Sheet for Healthcare Providers: quierodirigir.com  This test is not yet approved or cleared by the [place] FDA and  has been authorized for detection and/or diagnosis of SARS-CoV-2 by FDA under an Emergency Use Authorization (EUA). This EUA will remain  in effect (meaning this test can be used) for the duration of the COVID-19 declaration under Se ction 564(b)(1) of the Act, 21 U.S.C. section 360bbb-3(b)(1), unless the authorization is terminated  or revoked sooner.  Performed at [facility], [address] [place], [place] [address]   Culture, BAL-quantitative w Gram Stain     Status: None (Preliminary result)   Collection Time: 08/29/22  4:12 PM   Specimen: Bronchoalveolar Lavage; Respiratory  Result Value Ref Range Status   Specimen Description   Final    BRONCHIAL ALVEOLAR LAVAGE Performed at [facility], [address] [place], [place] [address]    Special Requests   Final    NONE Performed at [facility], [address] [place], [place] [address]    Gram Stain NO WBC SEEN NO ORGANISMS SEEN   Final   Culture   Final    NO GROWTH < 24 HOURS Performed at [facility], [address] [place], [place] [address]    Report Status PENDING  Incomplete      Radiology Studies last 3 days: CT [facility] PLEURAL DRAIN W/INDWELL CATH W/IMG GUIDE  Result Date: 08/30/2022 INDICATION: Left pneumothorax EXAM: CT-guided left-sided chest tube placement TECHNIQUE: Multidetector CT imaging of the chest was performed following the standard protocol with/without IV contrast. RADIATION DOSE REDUCTION: This exam was performed according to the departmental dose-optimization program which includes automated exposure control, adjustment of the mA and/or kV according to patient size and/or use of iterative reconstruction technique. MEDICATIONS: Documented in the EMR ANESTHESIA/SEDATION: Local analgesia COMPLICATIONS: None immediate. PROCEDURE: Informed written consent was obtained from the patient after a thorough discussion of the procedural risks, benefits and alternatives. All questions were addressed. Maximal Sterile Barrier Technique was utilized including caps, mask, sterile gowns, sterile gloves, sterile drape, hand hygiene and skin antiseptic. A timeout was performed prior to the initiation of the procedure. The patient was placed supine on the exam table. Limited CT of the chest was performed for planning  purposes. This demonstrated large left pneumothorax. Skin entry site was marked, and the overlying skin was prepped and draped in the standard sterile fashion. Local analgesia was obtained with 1% lidocaine. Using intermittent CT fluoroscopy, a 19 gauge Yueh catheter was  advanced into the left pleural space. Location was confirmed with CT and return of air. Over an Amplatz wire, the access tract was serially dilated to accommodate an 8 French locking pigtail catheter. Location was again confirmed with CT and return of air. Drainage catheter was secured to the skin using silk suture and a dressing. It was attached to a chest tube drainage system. Patient tolerated procedure well without immediate complication. IMPRESSION: Successful CT-guided placement of an 8 French locking chest tube in the left pleural space for treatment of pneumothorax. Electronically Signed   By: Olive Bass M.D.   On: 08/30/2022 10:56   Portable chest 1 View  Result Date: 08/30/2022 CLINICAL DATA:  [id number]. Follow-up pneumothorax post pigtail chest tube placement of the left. EXAM: PORTABLE CHEST 1 VIEW COMPARISON:  Portable chest yesterday at 3:31 p.m., chest CT without contrast 08/23/2022 FINDINGS: 5:18 a.m. Interval insertion of a small caliber pigtail tube thoracostomy in the left chest base. Left lung has reinflated except for a small pneumothorax visible along the lateral left thoracic margin estimated no more than 10% volume. The mediastinum is slightly shifted to the right but less than previously. Scattered multiple bilateral coarsely nodular pulmonary opacities continue to be noted. There is patchy increased opacity in the left base which could be re-expansion edema or an area of pneumonia. No other new opacity is seen. There is mild cardiomegaly, no findings of acute CHF. Aortic tortuosity and atherosclerosis with stable mediastinum. Thoracic spondylosis. IMPRESSION: 1. Re-expansion of the left lung following pigtail chest tube  placement. 2. Small residual pneumothorax along the lateral left thoracic margin estimated no more than 10% volume. The mediastinum is still shifted to the right but less than previously. 3. Patchy increased opacity in the left base could be re-expansion edema or an area of pneumonia. 4. Multiple bilateral coarsely nodular pulmonary opacities. 5. Aortic atherosclerosis. Electronically Signed   By: Almira Bar M.D.   On: 08/30/2022 06:00   DG Chest Port 1 View  Result Date: 08/29/2022 CLINICAL DATA:  Post bronchoscopy EXAM: PORTABLE CHEST 1 VIEW COMPARISON:  Portable exam 1531 hours compared to 06/20/2022 FINDINGS: Large LEFT pneumothorax with complete collapse of LEFT lung. Mediastinal shift to RIGHT. Findings are consistent tension pneumothorax. Scattered infiltrate RIGHT lung. Osseous structures unremarkable. IMPRESSION: Large LEFT tension pneumothorax with complete collapse of LEFT lung and mediastinal shift to RIGHT. Critical Value/emergent results were called by telephone at the time of interpretation on 08/29/2022 at 3:42 pm to provider FUAD ALESKEROV , who verbally acknowledged these results. Electronically Signed   By: Ulyses Southward M.D.   On: 08/29/2022 15:43   DG C-Arm 1-60 Min-No Report  Result Date: 08/29/2022 Fluoroscopy was utilized by the requesting physician.  No radiographic interpretation.             LOS: 0 days       Sunnie Nielsen, DO [facility] 08/30/2022, 3:03 PM    Dictation software may have been used to generate the above note. Typos may occur and escape review in typed/dictated notes. Please contact Dr Lyn Hollingshead directly for clarity if needed.  Staff may message me via secure chat in Epic  but this may not receive an immediate response,  please page me for urgent matters!  If 7PM-7AM, please contact night coverage [email or web address]

## 2022-08-31 ENCOUNTER — Inpatient Hospital Stay: Payer: PPO

## 2022-08-31 DIAGNOSIS — J939 Pneumothorax, unspecified: Secondary | ICD-10-CM | POA: Diagnosis not present

## 2022-08-31 LAB — CULTURE, BAL-QUANTITATIVE W GRAM STAIN: Gram Stain: NONE SEEN

## 2022-08-31 LAB — ACID FAST SMEAR (AFB, MYCOBACTERIA): Acid Fast Smear: NEGATIVE

## 2022-08-31 MED ORDER — ACETAMINOPHEN 325 MG PO TABS
650.0000 mg | ORAL_TABLET | Freq: Two times a day (BID) | ORAL | Status: DC
  Administered 2022-08-31 – 2022-09-03 (×7): 650 mg via ORAL
  Filled 2022-08-31 (×7): qty 2

## 2022-08-31 MED ORDER — ENOXAPARIN SODIUM 60 MG/0.6ML IJ SOSY
0.5000 mg/kg | PREFILLED_SYRINGE | INTRAMUSCULAR | Status: DC
  Administered 2022-09-01 – 2022-09-03 (×3): 55 mg via SUBCUTANEOUS
  Filled 2022-08-31 (×3): qty 0.6

## 2022-08-31 NOTE — Progress Notes (Signed)
PHARMACIST - PHYSICIAN COMMUNICATION  CONCERNING:  Enoxaparin (Lovenox) for DVT Prophylaxis    RECOMMENDATION: Patient was prescribed enoxaprin 40mg  q24 hours for VTE prophylaxis.   Filed Weights   08/29/22 1106  Weight: 108.9 kg (240 lb)    Body mass index is 39.94 kg/m.  Estimated Creatinine Clearance: 71.9 mL/min (by C-G formula based on SCr of 0.92 mg/dL).   Based on [facility] policy patient is candidate for enoxaparin 0.5mg /kg TBW SQ every 24 hours based on BMI being >30.  DESCRIPTION: Pharmacy has adjusted enoxaparin dose per [facility] policy.  Patient is now receiving enoxaparin 55 mg every 24 hours    Barrie Folk, PharmD Clinical Pharmacist  08/31/2022 1:42 PM

## 2022-08-31 NOTE — Progress Notes (Addendum)
PULMONOLOGY         Date: 08/31/2022,   MRN# [id number] DENYLAH PELTER 1956-03-31     AdmissionWeight: 108.9 kg                 CurrentWeight: 108.9 kg  Referring provider: Dr Jenne Campus   CHIEF COMPLAINT:   Hypermetabolic lung nodules   HISTORY OF PRESENT ILLNESS   67 year old female with a history of chronic moderate persistent asthma, osteoarthritis of the neck, coronary artery disease history of skin basal cell carcinoma, carpal tunnel syndrome, colonic polyps, dyspnea on exertion, GERD, gout, dyslipidemia, essential hypertension, iron deficiency, obstructive sleep apnea, vitamin D deficiency for reevaluation of hypermetabolic lung nodules.  Specifically question remains if this possibly cancer versus vasculitis as therapy is quite different.  Patient did report improvement after prednisone however this therapy is generally temporary and patient needs definitive treatment whether this is a vasculitis or not.  Additionally any mucous plugging will be removed via aspiration, bronchoalveolar lavage will be performed for additional microbiology transbronchial and endobronchial biopsies will be performed for histologic evaluation for atypia including carcinoma as well as organizing pneumonia versus vasculitis.  -08/30/22- patient developed pneumothorax on left after FNA during bronch yesterday.  She is on room air with only 10%..  She is doing well.  Mild wheezing , have ordered albuterol.  I have clamped chest tube and repeat cxr has been ordered.   08/31/22 - Patient seen this am, she had non resolved left pneumothorax post clamping of tube suction.  I have since re-inflated the lung and have ordered another cxr for today. This should be much improved.  Will need to keep patient for some time to allow bronchopleural fistula to resolve. She is in no distress , slept well with no acute events   PAST MEDICAL HISTORY   Past Medical History:  Diagnosis Date   Arthritis    neck, knees    Asthma    Calcification of right carotid artery    Cancer of the skin, basal cell    a.) s/p excision from neck, back, and foot   Carpal tunnel syndrome    Colon polyps    Difficult intubation    Dyspnea    Gastric ulcer    GERD (gastroesophageal reflux disease)    Gout    History of 2019 novel coronavirus disease (COVID-19) 10/21/2020   Hyperlipidemia    Hypertension    Iron deficiency    Iron deficiency anemia    OSA (obstructive sleep apnea)    a.) does not require nocturnal PAP therapy   Pulmonary nodule    Vertigo    last episode 12/2018   Vitamin D deficiency    Wears dentures    partial upper     SURGICAL HISTORY   Past Surgical History:  Procedure Laterality Date   ABDOMINAL HYSTERECTOMY     APPENDECTOMY     BACK SURGERY     x2 L5 FOR RUPTURED DISC   CARDIAC CATHETERIZATION  1990's   CATARACT EXTRACTION W/PHACO Left 04/07/2019   Procedure: CATARACT EXTRACTION PHACO AND INTRAOCULAR LENS PLACEMENT (IOC) LEFT 4.43, 00:34.1;  Surgeon: Galen Manila, MD;  Location: [facility];  Service: Ophthalmology;  Laterality: Left;  Sleep Apnea-does not wear CPAP Latex-adhesives   CATARACT EXTRACTION W/PHACO Right 05/05/2019   Procedure: CATARACT EXTRACTION PHACO AND INTRAOCULAR LENS PLACEMENT (IOC) RIGHT;  4.83, 00:33.4;  Surgeon: Galen Manila, MD;  Location: [facility];  Service: Ophthalmology;  Laterality: Right;  SLEP APNEA-CPAP   CHOLECYSTECTOMY     COLONOSCOPY     2006, 2009, 2014, 2019   COLONOSCOPY WITH PROPOFOL N/A 10/16/2017   Procedure: COLONOSCOPY WITH PROPOFOL;  Surgeon: Scot Jun, MD;  Location: [facility];  Service: Endoscopy;  Laterality: N/A;   ESOPHAGOGASTRODUODENOSCOPY     2006, 2014, 2015,2019   ESOPHAGOGASTRODUODENOSCOPY (EGD) WITH PROPOFOL N/A 10/16/2017   Procedure: ESOPHAGOGASTRODUODENOSCOPY (EGD) WITH PROPOFOL;  Surgeon: Scot Jun, MD;  Location[facility];  Service: Endoscopy;  Laterality: N/A;    EUS N/A 08/09/2022   Procedure: UPPER ENDOSCOPIC ULTRASOUND (EUS) LINEAR;  Surgeon: Bearl Mulberry, MD;  Location: [facility] END[facility]COPY;  Service: Gastroenterology;  Laterality: N/A;   FUNCTIONAL ENDOSCOPIC SINUS SURGERY     JOINT REPLACEMENT     KNEE ARTHROSCOPY Right 11/30/2002   LUMBAR DISC SURGERY     ruptured disc x 2   TONSILLECTOMY AND ADENOIDECTOMY     TOTAL KNEE ARTHROPLASTY Right 10/25/2020   Procedure: TOTAL KNEE ARTHROPLASTY - [place] Neighbor to assist;  Surgeon: Kennedy Bucker, MD;  Location: [facility];  Service: Orthopedics;  Laterality: Right;   TOTAL KNEE ARTHROPLASTY Left 07/06/2021   Procedure: TOTAL KNEE ARTHROPLASTY;  Surgeon: Kennedy Bucker, MD;  Location: [facility];  Service: Orthopedics;  Laterality: Left;   VIDEO BRONCHOSCOPY WITH ENDOBRONCHIAL ULTRASOUND N/A 06/20/2022   Procedure: VIDEO BRONCHOSCOPY WITH ENDOBRONCHIAL ULTRASOUND;  Surgeon: Vida Rigger, MD;  Location: [facility];  Service: Thoracic;  Laterality: N/A;   VIDEO BRONCHOSCOPY WITH ENDOBRONCHIAL ULTRASOUND N/A 08/29/2022   Procedure: VIDEO BRONCHOSCOPY WITH ENDOBRONCHIAL ULTRASOUND;  Surgeon: Vida Rigger, MD;  Location: [facility];  Service: Thoracic;  Laterality: N/A;     FAMILY HISTORY   Family History  Problem Relation Age of Onset   Breast cancer Mother 79   Heart attack Father    Heart attack Brother      SOCIAL HISTORY   Social History   Tobacco Use   Smoking status: Never   Smokeless tobacco: Never   Tobacco comments:    former social smoker  Vaping Use   Vaping Use: Never used  Substance Use Topics   Alcohol use: No    Alcohol/week: 0.0 standard drinks of alcohol   Drug use: No     MEDICATIONS    Home Medication:    Current Medication:  Current Facility-Administered Medications:    acetaminophen (TYLENOL) tablet 650 mg, 650 mg, Oral, BID, Jaaziel Peatross, MD   albuterol (PROVENTIL) (2.5 MG/3ML) 0.083% nebulizer solution 2.5 mg, 2.5 mg, Nebulization, Q6H PRN, Vida Rigger, MD   benzonatate (TESSALON) capsule 200 mg, 200 mg, Oral, BID PRN, Sunnie Nielsen, DO   bisacodyl (DULCOLAX) EC tablet 5 mg, 5 mg, Oral, Daily PRN, Sunnie Nielsen, DO   enoxaparin (LOVENOX) injection 40 mg, 40 mg, Subcutaneous, Q24H, Alexander, Natalie, DO, 40 mg at 08/30/22 0947   losartan (COZAAR) tablet 50 mg, 50 mg, Oral, Daily, Sunnie Nielsen, DO, 50 mg at 08/30/22 0947   montelukast (SINGULAIR) tablet 10 mg, 10 mg, Oral, Daily, Sunnie Nielsen, DO, 10 mg at 08/30/22 0947   morphine (PF) 2 MG/ML injection 2 mg, 2 mg, Intravenous, Q2H PRN, Sunnie Nielsen, DO   multivitamin with minerals tablet 1 tablet, 1 tablet, Oral, Daily, Sunnie Nielsen, DO, 1 tablet at 08/30/22 0947   ondansetron (ZOFRAN) tablet 4 mg, 4 mg, Oral, Q6H PRN **OR** ondansetron (ZOFRAN) injection 4 mg, 4 mg, Intravenous, Q6H PRN, Sunnie Nielsen, DO   oxyCODONE (Oxy IR/ROXICODONE) immediate release tablet 5 mg, 5 mg,  Oral, Q4H PRN, Sunnie Nielsen, DO   pantoprazole (PROTONIX) EC tablet 40 mg, 40 mg, Oral, BID, Sunnie Nielsen, DO, 40 mg at 08/30/22 2136   senna-docusate (Senokot-S) tablet 1 tablet, 1 tablet, Oral, QHS PRN, Sunnie Nielsen, DO   traZODone (DESYREL) tablet 25 mg, 25 mg, Oral, QHS PRN, Sunnie Nielsen, DO    ALLERGIES   Albuterol, Beclomethasone, Sulfa antibiotics, Lodine [etodolac], Latex, and Meloxicam     REVIEW OF SYSTEMS    Review of Systems:  Gen:  Denies  fever, sweats, chills weigh loss  HEENT: Denies blurred vision, double vision, ear pain, eye pain, hearing loss, nose bleeds, sore throat Cardiac:  No dizziness, chest pain or heaviness, chest tightness,edema Resp:   reports dyspnea chronically  Gi: Denies swallowing difficulty, stomach pain, nausea or vomiting, diarrhea, constipation, bowel incontinence Gu:  Denies bladder incontinence, burning urine Ext:   Denies Joint pain, stiffness or swelling Skin: Denies  skin rash, easy bruising or  bleeding or hives Endoc:  Denies polyuria, polydipsia , polyphagia or weight change Psych:   Denies depression, insomnia or hallucinations   Other:  All other systems negative   VS: BP (!) 148/72 (BP Location: Left Arm)   Pulse 91   Temp 98.4 F (36.9 C) (Oral)   Resp 17   Ht 5\' 5"  (1.651 m)   Wt 108.9 kg   SpO2 92%   BMI 39.94 kg/m      PHYSICAL EXAM    GENERAL:NAD, no fevers, chills, no weakness no fatigue HEAD: Normocephalic, atraumatic.  EYES: Pupils equal, round, reactive to light. Extraocular muscles intact. No scleral icterus.  MOUTH: Moist mucosal membrane. Dentition intact. No abscess noted.  EAR, NOSE, THROAT: Clear without exudates. No external lesions.  NECK: Supple. No thyromegaly. No nodules. No JVD.  PULMONARY: decreased breath sounds with mild rhonchi worse at bases bilaterally.  CARDIOVASCULAR: S1 and S2. Regular rate and rhythm. No murmurs, rubs, or gallops. No edema. Pedal pulses 2+ bilaterally.  GASTROINTESTINAL: Soft, nontender, nondistended. No masses. Positive bowel sounds. No hepatosplenomegaly.  MUSCULOSKELETAL: No swelling, clubbing, or edema. Range of motion full in all extremities.  NEUROLOGIC: Cranial nerves II through XII are intact. No gross focal neurological deficits. Sensation intact. Reflexes intact.  SKIN: No ulceration, lesions, rashes, or cyanosis. Skin warm and dry. Turgor intact.  PSYCHIATRIC: Mood, affect within normal limits. The patient is awake, alert and oriented x 3. Insight, judgment intact.       IMAGING     ASSESSMENT/PLAN   Bilateral pulmonary nodules  -s/p bronchoscopy with FNA induced left pneumothorax   Left secondary pneumothorax - chest tube management - no air leak, have clamped tube will repeat CXR in am. Patient on room air , asymptomatic  - repeat cxr this morning with non-resolved pneumothorax , I have reinflated lung after this and have another x ray pending for today.     Thank you for allowing me to  participate in the care of this patient.   Patient/Family are satisfied with care plan and all questions have been answered.    Provider disclosure: Patient with at least one acute or chronic illness or injury that poses a threat to life or bodily function and is being managed actively during this encounter.  All of the below services have been performed independently by signing provider:  review of prior documentation from internal and or external health records.  Review of previous and current lab results.  Interview and comprehensive assessment during patient visit today. Review of  current and previous chest radiographs/CT scans. Discussion of management and test interpretation with health care team and patient/family.   This document was prepared using Dragon voice recognition software and may include unintentional dictation errors.     Ottie Glazier, M.D.  Division of Pulmonary & Critical Care Medicine

## 2022-08-31 NOTE — Progress Notes (Signed)
PROGRESS NOTE    BRYAHNA ROATH   ZOX:[id number] DOB: 1955-05-25  DOA: 08/29/2022 Date of Service: 08/31/22 PCP: Bosie Clos, MD     Brief Narrative / Hospital Course:  JETAIME MATHIEU is a 67 y.o. female w/ PMH chronic moderate persistent asthma, osteoarthritis of the neck, coronary artery disease history of skin basal cell carcinoma, carpal tunnel syndrome, colonic polyps, dyspnea on exertion, GERD, gout, dyslipidemia, essential hypertension, iron deficiency, obstructive sleep apnea, vitamin D deficiency. She was seen today by Dr Karna Christmas for bronchoscopy / evaluation hypermetabolic lung nodules (to ascertain cancer vs vasculitis).  05/01: bronchoscopy was successful but complicated by L pneumothorax. Pt stable on 3L O2. IR placed pleural drain. Hospitalist consulted for admission. 05/02: CXR AM shows reexpansion L lung except small PTX L thoracic margin no more than 10% volume. Off O2. Clamped chest tube, plan CXR tomorrow  05/03: recurrence PTX, chest tube back to suction, lung reinflated, pulmonary following, likely will keep here thru the weekend to allow resolution bronchopleural fistula, repeat CXR later today and tomorrow AM.   Consultants:  Pulmonary  IR  Procedures: 08/29/22 bronchoscopy w/ transbronchial and endobronchial biopsies for histologic evaluation, as well as therapeutic lavage/aspiration LUL mucus plugging,  08/29/22 placement L pigtail chest tube catheter       ASSESSMENT & PLAN:   Active Problems:             L pneumothorax             Lung mass uncertain etiology  Chronic Problems:             Essential HTN             GERD             Moderate Persistent Asthma              HLD             OSA                L pneumothorax, recurrent Chest tube in place --> leave to suction thru the weekend to allow healing bronchopleural fistula pulmonary following Follow serial CXR O2 support as needed    Lung mass uncertain etiology  Pending biopsy  results   Essential HTN Cont home losartan   GERD Cont home PPI   Moderate Persistent Asthma  Albuterol prn    HLD Follow outpatient   OSA CPAP qhs          DVT prophylaxis: lovenox  Pertinent IV fluids/nutrition: no continuous IV fluids, regular diet  Central lines / invasive devices: L chest tube   Code Status: FULL CODE ACP documentation reviewed: 05/01, none on file in [facility]   Current Admission Status: inpatient  TOC needs / Dispo plan: none Barriers to discharge / significant pending items: will be here thru the weekend penidng perisstent resolution PTX                Subjective / Brief ROS:  Patient reports feeling better today  Denies CP/SOB.  Pain controlled.  Denies new weakness.  Tolerating diet.    Family Communication: none at this time     Objective Findings:  Vitals:   08/30/22 1639 08/30/22 1954 08/31/22 0422 08/31/22 0911  BP: (!) 126/59 (!) 142/66 (!) 148/72 133/64  Pulse: 82 85 91 84  Resp: 16 18 17 17   Temp: 98.8 F (37.1 C) 98.6 F (37 C) 98.4 F (36.9 C) 98.4 F (36.9 C)  TempSrc:  Oral Oral   SpO2: 94% 95% 92% 91%  Weight:      Height:        Intake/Output Summary (Last 24 hours) at 08/31/2022 1507 Last data filed at 08/31/2022 1420 Gross per 24 hour  Intake 720 ml  Output --  Net 720 ml   Filed Weights   08/29/22 1106  Weight: 108.9 kg    Examination:  Physical Exam Constitutional:      General: She is not in acute distress. Cardiovascular:     Rate and Rhythm: Normal rate and regular rhythm.  Pulmonary:     Effort: Pulmonary effort is normal. No respiratory distress.     Breath sounds: Normal breath sounds.  Skin:    General: Skin is warm and dry.  Neurological:     General: No focal deficit present.     Mental Status: She is alert and oriented to person, place, and time.  Psychiatric:        Mood and Affect: Mood normal.        Behavior: Behavior normal.          Scheduled Medications:    acetaminophen  650 mg Oral BID   [START ON 09/01/2022] enoxaparin (LOVENOX) injection  0.5 mg/kg Subcutaneous Q24H   losartan  50 mg Oral Daily   montelukast  10 mg Oral Daily   multivitamin with minerals  1 tablet Oral Daily   pantoprazole  40 mg Oral BID    Continuous Infusions:   PRN Medications:  albuterol, benzonatate, bisacodyl, morphine injection, ondansetron **OR** ondansetron (ZOFRAN) IV, oxyCODONE, senna-docusate, traZODone  Antimicrobials from admission:  Anti-infectives (From admission, onward)    None           Data Reviewed:  I have personally reviewed the following...  CBC: Recent Labs  Lab 08/29/22 1741 08/30/22 0550  WBC 6.8 9.9  HGB 13.0 11.7*  HCT 40.4 37.1  MCV 78.9* 79.1*  PLT 194 193   Basic Metabolic Panel: Recent Labs  Lab 08/29/22 1741 08/30/22 0550  NA  --  138  K  --  4.1  CL  --  105  CO2  --  25  GLUCOSE  --  118*  BUN  --  25*  CREATININE 1.12* 0.92  CALCIUM  --  9.0   GFR: Estimated Creatinine Clearance: 71.9 mL/min (by C-G formula based on SCr of 0.92 mg/dL). Liver Function Tests: No results for input(s): "AST", "ALT", "ALKPHOS", "BILITOT", "PROT", "ALBUMIN" in the last 168 hours. No results for input(s): "LIPASE", "AMYLASE" in the last 168 hours. No results for input(s): "AMMONIA" in the last 168 hours. Coagulation Profile: No results for input(s): "INR", "PROTIME" in the last 168 hours. Cardiac Enzymes: No results for input(s): "CKTOTAL", "CKMB", "CKMBINDEX", "TROPONINI" in the last 168 hours. BNP (last 3 results) No results for input(s): "PROBNP" in the last 8760 hours. HbA1C: No results for input(s): "HGBA1C" in the last 72 hours. CBG: No results for input(s): "GLUCAP" in the last 168 hours. Lipid Profile: No results for input(s): "CHOL", "HDL", "LDLCALC", "TRIG", "CHOLHDL", "LDLDIRECT" in the last 72 hours. Thyroid Function Tests: No results for input(s): "TSH", "T4TOTAL", "FREET4", "T3FREE", "THYROIDAB" in  the last 72 hours. Anemia Panel: No results for input(s): "VITAMINB12", "FOLATE", "FERRITIN", "TIBC", "IRON", "RETICCTPCT" in the last 72 hours. Most Recent Urinalysis On File:     Component Value Date/Time   COLORURINE YELLOW (A) 06/22/2021 1019   APPEARANCEUR HAZY (A) 06/22/2021 1019   LABSPEC 1.016 06/22/2021 1019  PHURINE 6.0 06/22/2021 1019   GLUCOSEU NEGATIVE 06/22/2021 1019   HGBUR NEGATIVE 06/22/2021 1019   BILIRUBINUR NEGATIVE 06/22/2021 1019   BILIRUBINUR negative 02/12/2020 1035   KETONESUR NEGATIVE 06/22/2021 1019   PROTEINUR NEGATIVE 06/22/2021 1019   UROBILINOGEN 0.2 02/12/2020 1035   NITRITE NEGATIVE 06/22/2021 1019   LEUKOCYTESUR MODERATE (A) 06/22/2021 1019   Sepsis Labs: @LABRCNTIP (procalcitonin:4,lacticidven:4) Microbiology: Recent Results (from the past 240 hour(s))  SARS CORONAVIRUS 2 (TAT 6-24 HRS) Anterior Nasal Swab     Status: None   Collection Time: 08/27/22  3:25 PM   Specimen: Anterior Nasal Swab  Result Value Ref Range Status   SARS Coronavirus 2 NEGATIVE NEGATIVE Final    Comment: (NOTE) SARS-CoV-2 target nucleic acids are NOT DETECTED.  The SARS-CoV-2 RNA is generally detectable in upper and lower respiratory specimens during the acute phase of infection. Negative results do not preclude SARS-CoV-2 infection, do not rule out co-infections with other pathogens, and should not be used as the sole basis for treatment or other patient management decisions. Negative results must be combined with clinical observations, patient history, and epidemiological information. The expected result is Negative.  Fact Sheet for Patients: HairSlick.no  Fact Sheet for Healthcare Providers: quierodirigir.com  This test is not yet approved or cleared by the [place] FDA and  has been authorized for detection and/or diagnosis of SARS-CoV-2 by FDA under an Emergency Use Authorization (EUA). This EUA  will remain  in effect (meaning this test can be used) for the duration of the COVID-19 declaration under Se ction 564(b)(1) of the Act, 21 U.S.C. section 360bbb-3(b)(1), unless the authorization is terminated or revoked sooner.  Performed at [facility], [address] [place], [place] [address]   Culture, BAL-quantitative w Gram Stain     Status: None (Preliminary result)   Collection Time: 08/29/22  4:12 PM   Specimen: Bronchoalveolar Lavage; Respiratory  Result Value Ref Range Status   Specimen Description   Final    BRONCHIAL ALVEOLAR LAVAGE Performed at [facility], [address] [place], [place] [address]    Special Requests   Final    NONE Performed at [facility], [address] [place], [place] [address]    Gram Stain NO WBC SEEN NO ORGANISMS SEEN   Final   Culture   Final    CULTURE REINCUBATED FOR BETTER GROWTH Performed at [facility], [address], [place], [place] [address]    Report Status PENDING  Incomplete  Acid Fast Smear (AFB)     Status: None   Collection Time: 08/29/22  4:12 PM   Specimen: Bronchoalveolar Lavage; Respiratory  Result Value Ref Range Status   AFB Specimen Processing Concentration  Final   Acid Fast Smear Negative  Final    Comment: (NOTE) Performed At: [facility] [address] [place], [place] [id number] Jolene Schimke MD OZ:[id number]    Source (AFB) BRONCHIAL ALVEOLAR LAVAGE  Final    Comment: Performed at [facility], [address], [place], [place] [address]      Radiology Studies last 3 days: [facility] 1 View  Result Date: 08/31/2022 CLINICAL DATA:  Left pneumothorax. EXAM: PORTABLE CHEST 1 VIEW COMPARISON:  Aug 31, 2022. FINDINGS: Stable cardiomediastinal silhouette with mild central pulmonary vascular congestion. Left-sided chest tube is again noted without pneumothorax. Interstitial densities are noted throughout both lungs concerning for edema or atypical  inflammation. Bony thorax is unremarkable. IMPRESSION: Left-sided chest tube is noted without pneumothorax.  Bilateral diffuse interstitial densities are noted concerning for edema or atypical inflammation. Electronically Signed   By: Lupita Raider M.D.   On: 08/31/2022 08:56   DG Chest Port 1 View  Result Date: 08/31/2022 CLINICAL DATA:  Left-sided pneumothorax EXAM: PORTABLE CHEST 1 VIEW COMPARISON:  Chest radiograph dated 08/30/2022 FINDINGS: Lines/tubes: Superomedial repositioning of left pleural catheter. Chest: Asymmetric near-complete atelectasis of the left lung. Persistent right interstitial and nodular opacities. Pleura: Increased large left pneumothorax.  No pleural effusion. Heart/mediastinum: Similar enlarged cardiomediastinal silhouette. Bones: No acute osseous abnormality. IMPRESSION: 1. Superomedial repositioning of left pleural catheter with increased large left pneumothorax, decreased on the subsequent radiograph. 2. Asymmetric near-complete atelectasis of the left lung. Electronically Signed   By: Agustin Cree M.D.   On: 08/31/2022 08:37   CT [facility] PLEURAL DRAIN W/INDWELL CATH W/IMG GUIDE  Result Date: 08/30/2022 INDICATION: Left pneumothorax EXAM: CT-guided left-sided chest tube placement TECHNIQUE: Multidetector CT imaging of the chest was performed following the standard protocol with/without IV contrast. RADIATION DOSE REDUCTION: This exam was performed according to the departmental dose-optimization program which includes automated exposure control, adjustment of the mA and/or kV according to patient size and/or use of iterative reconstruction technique. MEDICATIONS: Documented in the EMR ANESTHESIA/SEDATION: Local analgesia COMPLICATIONS: None immediate. PROCEDURE: Informed written consent was obtained from the patient after a thorough discussion of the procedural risks, benefits and alternatives. All questions were addressed. Maximal Sterile Barrier Technique was utilized including caps,  mask, sterile gowns, sterile gloves, sterile drape, hand hygiene and skin antiseptic. A timeout was performed prior to the initiation of the procedure. The patient was placed supine on the exam table. Limited CT of the chest was performed for planning purposes. This demonstrated large left pneumothorax. Skin entry site was marked, and the overlying skin was prepped and draped in the standard sterile fashion. Local analgesia was obtained with 1% lidocaine. Using intermittent CT fluoroscopy, a 19 gauge Yueh catheter was advanced into the left pleural space. Location was confirmed with CT and return of air. Over an Amplatz wire, the access tract was serially dilated to accommodate an 8 French locking pigtail catheter. Location was again confirmed with CT and return of air. Drainage catheter was secured to the skin using silk suture and a dressing. It was attached to a chest tube drainage system. Patient tolerated procedure well without immediate complication. IMPRESSION: Successful CT-guided placement of an 8 French locking chest tube in the left pleural space for treatment of pneumothorax. Electronically Signed   By: Olive Bass M.D.   On: 08/30/2022 10:56   Portable chest 1 View  Result Date: 08/30/2022 CLINICAL DATA:  [id number]. Follow-up pneumothorax post pigtail chest tube placement of the left. EXAM: PORTABLE CHEST 1 VIEW COMPARISON:  Portable chest yesterday at 3:31 p.m., chest CT without contrast 08/23/2022 FINDINGS: 5:18 a.m. Interval insertion of a small caliber pigtail tube thoracostomy in the left chest base. Left lung has reinflated except for a small pneumothorax visible along the lateral left thoracic margin estimated no more than 10% volume. The mediastinum is slightly shifted to the right but less than previously. Scattered multiple bilateral coarsely nodular pulmonary opacities continue to be noted. There is patchy increased opacity in the left base which could be re-expansion edema or an area of  pneumonia. No other new opacity is seen. There is mild cardiomegaly, no findings of acute CHF. Aortic tortuosity and atherosclerosis with stable mediastinum. Thoracic spondylosis. IMPRESSION: 1. Re-expansion of the left lung following pigtail chest tube placement.  2. Small residual pneumothorax along the lateral left thoracic margin estimated no more than 10% volume. The mediastinum is still shifted to the right but less than previously. 3. Patchy increased opacity in the left base could be re-expansion edema or an area of pneumonia. 4. Multiple bilateral coarsely nodular pulmonary opacities. 5. Aortic atherosclerosis. Electronically Signed   By: Almira Bar M.D.   On: 08/30/2022 06:00   DG Chest Port 1 View  Result Date: 08/29/2022 CLINICAL DATA:  Post bronchoscopy EXAM: PORTABLE CHEST 1 VIEW COMPARISON:  Portable exam 1531 hours compared to 06/20/2022 FINDINGS: Large LEFT pneumothorax with complete collapse of LEFT lung. Mediastinal shift to RIGHT. Findings are consistent tension pneumothorax. Scattered infiltrate RIGHT lung. Osseous structures unremarkable. IMPRESSION: Large LEFT tension pneumothorax with complete collapse of LEFT lung and mediastinal shift to RIGHT. Critical Value/emergent results were called by telephone at the time of interpretation on 08/29/2022 at 3:42 pm to provider FUAD ALESKEROV , who verbally acknowledged these results. Electronically Signed   By: Ulyses Southward M.D.   On: 08/29/2022 15:43   DG C-Arm 1-60 Min-No Report  Result Date: 08/29/2022 Fluoroscopy was utilized by the requesting physician.  No radiographic interpretation.             LOS: 1 day       Sunnie Nielsen, DO [facility] 08/31/2022, 3:07 PM    Dictation software may have been used to generate the above note. Typos may occur and escape review in typed/dictated notes. Please contact Dr Lyn Hollingshead directly for clarity if needed.  Staff may message me via secure chat in Epic  but this may  not receive an immediate response,  please page me for urgent matters!  If 7PM-7AM, please contact night coverage [email or web address]

## 2022-09-01 ENCOUNTER — Inpatient Hospital Stay: Payer: PPO

## 2022-09-01 DIAGNOSIS — J939 Pneumothorax, unspecified: Secondary | ICD-10-CM | POA: Diagnosis not present

## 2022-09-01 LAB — CBC
HCT: 37.1 % (ref 36.0–46.0)
Hemoglobin: 11.5 g/dL — ABNORMAL LOW (ref 12.0–15.0)
MCH: 25.2 pg — ABNORMAL LOW (ref 26.0–34.0)
MCHC: 31 g/dL (ref 30.0–36.0)
MCV: 81.2 fL (ref 80.0–100.0)
Platelets: 172 10*3/uL (ref 150–400)
RBC: 4.57 MIL/uL (ref 3.87–5.11)
RDW: 16.4 % — ABNORMAL HIGH (ref 11.5–15.5)
WBC: 7.2 10*3/uL (ref 4.0–10.5)
nRBC: 0 % (ref 0.0–0.2)

## 2022-09-01 NOTE — Progress Notes (Signed)
PULMONOLOGY         Date: 09/01/2022,   MRN# [id number] LORNE ARONSON 1955-06-24     AdmissionWeight: 108.9 kg                 CurrentWeight: 108.9 kg  Referring provider: Dr Jenne Campus   CHIEF COMPLAINT:   Hypermetabolic lung nodules   HISTORY OF PRESENT ILLNESS   67 year old female with a history of chronic moderate persistent asthma, osteoarthritis of the neck, coronary artery disease history of skin basal cell carcinoma, carpal tunnel syndrome, colonic polyps, dyspnea on exertion, GERD, gout, dyslipidemia, essential hypertension, iron deficiency, obstructive sleep apnea, vitamin D deficiency for reevaluation of hypermetabolic lung nodules.  Specifically question remains if this possibly cancer versus vasculitis as therapy is quite different.  Patient did report improvement after prednisone however this therapy is generally temporary and patient needs definitive treatment whether this is a vasculitis or not.  Additionally any mucous plugging will be removed via aspiration, bronchoalveolar lavage will be performed for additional microbiology transbronchial and endobronchial biopsies will be performed for histologic evaluation for atypia including carcinoma as well as organizing pneumonia versus vasculitis.  -08/30/22- patient developed pneumothorax on left after FNA during bronch yesterday.  She is on room air with only 10%..  She is doing well.  Mild wheezing , have ordered albuterol.  I have clamped chest tube and repeat cxr has been ordered.   08/31/22 - Patient seen this am, she had non resolved left pneumothorax post clamping of tube suction.  I have since re-inflated the lung and have ordered another cxr for today. This should be much improved.  Will need to keep patient for some time to allow bronchopleural fistula to resolve. She is in no distress , slept well with no acute events  09/01/22- chest tube clamped today.  Patient has no residual pneumothorax.  Will repeat cxr in am  to confirm no recurrence post clamping of chest tube overnight. Plan to hopefully dc home Sunday 09/02/22   PAST MEDICAL HISTORY   Past Medical History:  Diagnosis Date   Arthritis    neck, knees   Asthma    Calcification of right carotid artery    Cancer of the skin, basal cell    a.) s/p excision from neck, back, and foot   Carpal tunnel syndrome    Colon polyps    Difficult intubation    Dyspnea    Gastric ulcer    GERD (gastroesophageal reflux disease)    Gout    History of 2019 novel coronavirus disease (COVID-19) 10/21/2020   Hyperlipidemia    Hypertension    Iron deficiency    Iron deficiency anemia    OSA (obstructive sleep apnea)    a.) does not require nocturnal PAP therapy   Pulmonary nodule    Vertigo    last episode 12/2018   Vitamin D deficiency    Wears dentures    partial upper     SURGICAL HISTORY   Past Surgical History:  Procedure Laterality Date   ABDOMINAL HYSTERECTOMY     APPENDECTOMY     BACK SURGERY     x2 L5 FOR RUPTURED DISC   CARDIAC CATHETERIZATION  1990's   CATARACT EXTRACTION W/PHACO Left 04/07/2019   Procedure: CATARACT EXTRACTION PHACO AND INTRAOCULAR LENS PLACEMENT (IOC) LEFT 4.43, 00:34.1;  Surgeon: Galen Manila, MD;  Location: [facility];  Service: Ophthalmology;  Laterality: Left;  Sleep Apnea-does not wear CPAP Latex-adhesives   CATARACT EXTRACTION  W/PHACO Right 05/05/2019   Procedure: CATARACT EXTRACTION PHACO AND INTRAOCULAR LENS PLACEMENT (IOC) RIGHT;  4.83, 00:33.4;  Surgeon: Galen Manila, MD;  Location: [facility];  Service: Ophthalmology;  Laterality: Right;  SLEP APNEA-CPAP   CHOLECYSTECTOMY     COLONOSCOPY     2006, 2009, 2014, 2019   COLONOSCOPY WITH PROPOFOL N/A 10/16/2017   Procedure: COLONOSCOPY WITH PROPOFOL;  Surgeon: Scot Jun, MD;  Location: [facility];  Service: Endoscopy;  Laterality: N/A;   ESOPHAGOGASTRODUODENOSCOPY     2006, 2014, 2015,2019    ESOPHAGOGASTRODUODENOSCOPY (EGD) WITH PROPOFOL N/A 10/16/2017   Procedure: ESOPHAGOGASTRODUODENOSCOPY (EGD) WITH PROPOFOL;  Surgeon: Scot Jun, MD;  Location: [facility];  Service: Endoscopy;  Laterality: N/A;   EUS N/A 08/09/2022   Procedure: UPPER ENDOSCOPIC ULTRASOUND (EUS) LINEAR;  Surgeon: Bearl Mulberry, MD;  Location: [facility];  Service: Gastroenterology;  Laterality: N/A;   FUNCTIONAL ENDOSCOPIC SINUS SURGERY     JOINT REPLACEMENT     KNEE ARTHROSCOPY Right 11/30/2002   LUMBAR DISC SURGERY     ruptured disc x 2   TONSILLECTOMY AND ADENOIDECTOMY     TOTAL KNEE ARTHROPLASTY Right 10/25/2020   Procedure: TOTAL KNEE ARTHROPLASTY - [place] Neighbor to assist;  Surgeon: Kennedy Bucker, MD;  Location: [facility];  Service: Orthopedics;  Laterality: Right;   TOTAL KNEE ARTHROPLASTY Left 07/06/2021   Procedure: TOTAL KNEE ARTHROPLASTY;  Surgeon: Kennedy Bucker, MD;  Location: [facility];  Service: Orthopedics;  Laterality: Left;   VIDEO BRONCHOSCOPY WITH ENDOBRONCHIAL ULTRASOUND N/A 06/20/2022   Procedure: VIDEO BRONCHOSCOPY WITH ENDOBRONCHIAL ULTRASOUND;  Surgeon: Vida Rigger, MD;  Location: [facility];  Service: Thoracic;  Laterality: N/A;   VIDEO BRONCHOSCOPY WITH ENDOBRONCHIAL ULTRASOUND N/A 08/29/2022   Procedure: VIDEO BRONCHOSCOPY WITH ENDOBRONCHIAL ULTRASOUND;  Surgeon: Vida Rigger, MD;  Location: [facility];  Service: Thoracic;  Laterality: N/A;     FAMILY HISTORY   Family History  Problem Relation Age of Onset   Breast cancer Mother 60   Heart attack Father    Heart attack Brother      SOCIAL HISTORY   Social History   Tobacco Use   Smoking status: Never   Smokeless tobacco: Never   Tobacco comments:    former social smoker  Vaping Use   Vaping Use: Never used  Substance Use Topics   Alcohol use: No    Alcohol/week: 0.0 standard drinks of alcohol   Drug use: No     MEDICATIONS    Home Medication:    Current Medication:  Current  Facility-Administered Medications:    acetaminophen (TYLENOL) tablet 650 mg, 650 mg, Oral, BID, Karna Christmas, Kaspian Muccio, MD, 650 mg at 09/01/22 1026   albuterol (PROVENTIL) (2.5 MG/3ML) 0.083% nebulizer solution 2.5 mg, 2.5 mg, Nebulization, Q6H PRN, Vida Rigger, MD   benzonatate (TESSALON) capsule 200 mg, 200 mg, Oral, BID PRN, Sunnie Nielsen, DO   bisacodyl (DULCOLAX) EC tablet 5 mg, 5 mg, Oral, Daily PRN, Sunnie Nielsen, DO   enoxaparin (LOVENOX) injection 55 mg, 0.5 mg/kg, Subcutaneous, Q24H, Prince Solian F, RPH, 55 mg at 09/01/22 1029   losartan (COZAAR) tablet 50 mg, 50 mg, Oral, Daily, Sunnie Nielsen, DO, 50 mg at 09/01/22 1026   montelukast (SINGULAIR) tablet 10 mg, 10 mg, Oral, Daily, Sunnie Nielsen, DO, 10 mg at 09/01/22 1026   morphine (PF) 2 MG/ML injection 2 mg, 2 mg, Intravenous, Q2H PRN, Sunnie Nielsen, DO   multivitamin with minerals tablet 1 tablet, 1 tablet, Oral, Daily, Sunnie Nielsen, DO, 1 tablet  at 09/01/22 1026   ondansetron (ZOFRAN) tablet 4 mg, 4 mg, Oral, Q6H PRN **OR** ondansetron (ZOFRAN) injection 4 mg, 4 mg, Intravenous, Q6H PRN, Sunnie Nielsen, DO   oxyCODONE (Oxy IR/ROXICODONE) immediate release tablet 5 mg, 5 mg, Oral, Q4H PRN, Sunnie Nielsen, DO   pantoprazole (PROTONIX) EC tablet 40 mg, 40 mg, Oral, BID, Sunnie Nielsen, DO, 40 mg at 09/01/22 1026   senna-docusate (Senokot-S) tablet 1 tablet, 1 tablet, Oral, QHS PRN, Sunnie Nielsen, DO   traZODone (DESYREL) tablet 25 mg, 25 mg, Oral, QHS PRN, Sunnie Nielsen, DO    ALLERGIES   Albuterol, Beclomethasone, Sulfa antibiotics, Lodine [etodolac], Latex, and Meloxicam     REVIEW OF SYSTEMS    Review of Systems:  Gen:  Denies  fever, sweats, chills weigh loss  HEENT: Denies blurred vision, double vision, ear pain, eye pain, hearing loss, nose bleeds, sore throat Cardiac:  No dizziness, chest pain or heaviness, chest tightness,edema Resp:   reports dyspnea  chronically  Gi: Denies swallowing difficulty, stomach pain, nausea or vomiting, diarrhea, constipation, bowel incontinence Gu:  Denies bladder incontinence, burning urine Ext:   Denies Joint pain, stiffness or swelling Skin: Denies  skin rash, easy bruising or bleeding or hives Endoc:  Denies polyuria, polydipsia , polyphagia or weight change Psych:   Denies depression, insomnia or hallucinations   Other:  All other systems negative   VS: BP (!) 113/59   Pulse 74   Temp 97.9 F (36.6 C) (Oral)   Resp 18   Ht 5\' 5"  (1.651 m)   Wt 108.9 kg   SpO2 96%   BMI 39.94 kg/m      PHYSICAL EXAM    GENERAL:NAD, no fevers, chills, no weakness no fatigue HEAD: Normocephalic, atraumatic.  EYES: Pupils equal, round, reactive to light. Extraocular muscles intact. No scleral icterus.  MOUTH: Moist mucosal membrane. Dentition intact. No abscess noted.  EAR, NOSE, THROAT: Clear without exudates. No external lesions.  NECK: Supple. No thyromegaly. No nodules. No JVD.  PULMONARY: decreased breath sounds with mild rhonchi worse at bases bilaterally.  CARDIOVASCULAR: S1 and S2. Regular rate and rhythm. No murmurs, rubs, or gallops. No edema. Pedal pulses 2+ bilaterally.  GASTROINTESTINAL: Soft, nontender, nondistended. No masses. Positive bowel sounds. No hepatosplenomegaly.  MUSCULOSKELETAL: No swelling, clubbing, or edema. Range of motion full in all extremities.  NEUROLOGIC: Cranial nerves II through XII are intact. No gross focal neurological deficits. Sensation intact. Reflexes intact.  SKIN: No ulceration, lesions, rashes, or cyanosis. Skin warm and dry. Turgor intact.  PSYCHIATRIC: Mood, affect within normal limits. The patient is awake, alert and oriented x 3. Insight, judgment intact.       IMAGING     ASSESSMENT/PLAN   Bilateral pulmonary nodules  -s/p bronchoscopy with FNA induced left pneumothorax   Left secondary pneumothorax - chest tube management - no air leak, have  clamped tube will repeat CXR in am. Patient on room air , asymptomatic  - repeat cxr this morning with non-resolved pneumothorax , I have reinflated lung after this and have another x ray pending for today.     Thank you for allowing me to participate in the care of this patient.   Patient/Family are satisfied with care plan and all questions have been answered.    Provider disclosure: Patient with at least one acute or chronic illness or injury that poses a threat to life or bodily function and is being managed actively during this encounter.  All of the below services have  been performed independently by signing provider:  review of prior documentation from internal and or external health records.  Review of previous and current lab results.  Interview and comprehensive assessment during patient visit today. Review of current and previous chest radiographs/CT scans. Discussion of management and test interpretation with health care team and patient/family.   This document was prepared using Dragon voice recognition software and may include unintentional dictation errors.     Vida Rigger, M.D.  Division of Pulmonary & Critical Care Medicine

## 2022-09-01 NOTE — Progress Notes (Signed)
PROGRESS NOTE    Sarah Marquez   ZOX:[id number] DOB: 04/27/1956  DOA: 08/29/2022 Date of Service: 09/01/22 PCP: Bosie Clos, MD     Brief Narrative / Hospital Course:  Sarah Marquez is a 67 y.o. female w/ PMH chronic moderate persistent asthma, osteoarthritis of the neck, coronary artery disease history of skin basal cell carcinoma, carpal tunnel syndrome, colonic polyps, dyspnea on exertion, GERD, gout, dyslipidemia, essential hypertension, iron deficiency, obstructive sleep apnea, vitamin D deficiency. She was seen today by Dr Karna Christmas for bronchoscopy / evaluation hypermetabolic lung nodules (to ascertain cancer vs vasculitis).  05/01: bronchoscopy was successful but complicated by L pneumothorax. Pt stable on 3L O2. IR placed pleural drain. Hospitalist consulted for admission. 05/02: CXR AM shows reexpansion L lung except small PTX L thoracic margin no more than 10% volume. Off O2. Clamped chest tube, plan CXR tomorrow  05/03: recurrence PTX, chest tube back to suction, lung reinflated, pulmonary following, likely will keep here thru the weekend to allow resolution bronchopleural fistula, repeat CXR later today and tomorrow AM.  05/04: lung sounds present all fields, no SOB, doing well.   Consultants:  Pulmonary  IR  Procedures: 08/29/22 bronchoscopy w/ transbronchial and endobronchial biopsies for histologic evaluation, as well as therapeutic lavage/aspiration LUL mucus plugging,  08/29/22 placement L pigtail chest tube catheter       ASSESSMENT & PLAN:   Active Problems:             L pneumothorax             Lung mass uncertain etiology  Chronic Problems:             Essential HTN             GERD             Moderate Persistent Asthma              HLD             OSA                L pneumothorax, recurrent Chest tube in place --> leave to suction thru the weekend to allow healing bronchopleural fistula pulmonary following Follow serial CXR O2 support  as needed    Lung mass uncertain etiology  Pending biopsy results   Essential HTN Cont home losartan   GERD Cont home PPI   Moderate Persistent Asthma  Albuterol prn    HLD Follow outpatient   OSA CPAP qhs          DVT prophylaxis: lovenox  Pertinent IV fluids/nutrition: no continuous IV fluids, regular diet  Central lines / invasive devices: L chest tube   Code Status: FULL CODE ACP documentation reviewed: 05/01, none on file in [facility]   Current Admission Status: inpatient  TOC needs / Dispo plan: none Barriers to discharge / significant pending items: will be here thru the weekend penidng perisstent resolution PTX          Subjective / Brief ROS:  Patient reports feeling well today Denies CP/SOB.  Pain controlled.  Denies new weakness.  Tolerating diet.    Family Communication: none at this time     Objective Findings:  Vitals:   08/31/22 1621 08/31/22 2111 09/01/22 0458 09/01/22 1136  BP: 125/61 139/74 129/67 (!) 113/59  Pulse: 71 81 60 74  Resp: 16 20 18 18   Temp: 98 F (36.7 C) 98.5 F (36.9 C) 97.8 F (36.6 C) 97.9 F (  36.6 C)  TempSrc: Oral Oral Oral Oral  SpO2: 95% 93% 97% 96%  Weight:      Height:        Intake/Output Summary (Last 24 hours) at 09/01/2022 1323 Last data filed at 09/01/2022 0600 Gross per 24 hour  Intake 480 ml  Output 30 ml  Net 450 ml   Filed Weights   08/29/22 1106  Weight: 108.9 kg    Examination:  Physical Exam Constitutional:      General: She is not in acute distress. Cardiovascular:     Rate and Rhythm: Normal rate and regular rhythm.  Pulmonary:     Effort: Pulmonary effort is normal. No respiratory distress.     Breath sounds: Normal breath sounds.  Skin:    General: Skin is warm and dry.  Neurological:     General: No focal deficit present.     Mental Status: She is alert and oriented to person, place, and time.  Psychiatric:        Mood and Affect: Mood normal.        Behavior: Behavior  normal.          Scheduled Medications:   acetaminophen  650 mg Oral BID   enoxaparin (LOVENOX) injection  0.5 mg/kg Subcutaneous Q24H   losartan  50 mg Oral Daily   montelukast  10 mg Oral Daily   multivitamin with minerals  1 tablet Oral Daily   pantoprazole  40 mg Oral BID    Continuous Infusions:   PRN Medications:  albuterol, benzonatate, bisacodyl, morphine injection, ondansetron **OR** ondansetron (ZOFRAN) IV, oxyCODONE, senna-docusate, traZODone  Antimicrobials from admission:  Anti-infectives (From admission, onward)    None           Data Reviewed:  I have personally reviewed the following...  CBC: Recent Labs  Lab 08/29/22 1741 08/30/22 0550 09/01/22 0335  WBC 6.8 9.9 7.2  HGB 13.0 11.7* 11.5*  HCT 40.4 37.1 37.1  MCV 78.9* 79.1* 81.2  PLT 194 193 172   Basic Metabolic Panel: Recent Labs  Lab 08/29/22 1741 08/30/22 0550  NA  --  138  K  --  4.1  CL  --  105  CO2  --  25  GLUCOSE  --  118*  BUN  --  25*  CREATININE 1.12* 0.92  CALCIUM  --  9.0   GFR: Estimated Creatinine Clearance: 71.9 mL/min (by C-G formula based on SCr of 0.92 mg/dL). Liver Function Tests: No results for input(s): "AST", "ALT", "ALKPHOS", "BILITOT", "PROT", "ALBUMIN" in the last 168 hours. No results for input(s): "LIPASE", "AMYLASE" in the last 168 hours. No results for input(s): "AMMONIA" in the last 168 hours. Coagulation Profile: No results for input(s): "INR", "PROTIME" in the last 168 hours. Cardiac Enzymes: No results for input(s): "CKTOTAL", "CKMB", "CKMBINDEX", "TROPONINI" in the last 168 hours. BNP (last 3 results) No results for input(s): "PROBNP" in the last 8760 hours. HbA1C: No results for input(s): "HGBA1C" in the last 72 hours. CBG: No results for input(s): "GLUCAP" in the last 168 hours. Lipid Profile: No results for input(s): "CHOL", "HDL", "LDLCALC", "TRIG", "CHOLHDL", "LDLDIRECT" in the last 72 hours. Thyroid Function Tests: No  results for input(s): "TSH", "T4TOTAL", "FREET4", "T3FREE", "THYROIDAB" in the last 72 hours. Anemia Panel: No results for input(s): "VITAMINB12", "FOLATE", "FERRITIN", "TIBC", "IRON", "RETICCTPCT" in the last 72 hours. Most Recent Urinalysis On File:     Component Value Date/Time   COLORURINE YELLOW (A) 06/22/2021 1019   APPEARANCEUR HAZY (A)  06/22/2021 1019   LABSPEC 1.016 06/22/2021 1019   PHURINE 6.0 06/22/2021 1019   GLUCOSEU NEGATIVE 06/22/2021 1019   HGBUR NEGATIVE 06/22/2021 1019   BILIRUBINUR NEGATIVE 06/22/2021 1019   BILIRUBINUR negative 02/12/2020 1035   KETONESUR NEGATIVE 06/22/2021 1019   PROTEINUR NEGATIVE 06/22/2021 1019   UROBILINOGEN 0.2 02/12/2020 1035   NITRITE NEGATIVE 06/22/2021 1019   LEUKOCYTESUR MODERATE (A) 06/22/2021 1019   Sepsis Labs: @LABRCNTIP (procalcitonin:4,lacticidven:4) Microbiology: Recent Results (from the past 240 hour(s))  SARS CORONAVIRUS 2 (TAT 6-24 HRS) Anterior Nasal Swab     Status: None   Collection Time: 08/27/22  3:25 PM   Specimen: Anterior Nasal Swab  Result Value Ref Range Status   SARS Coronavirus 2 NEGATIVE NEGATIVE Final    Comment: (NOTE) SARS-CoV-2 target nucleic acids are NOT DETECTED.  The SARS-CoV-2 RNA is generally detectable in upper and lower respiratory specimens during the acute phase of infection. Negative results do not preclude SARS-CoV-2 infection, do not rule out co-infections with other pathogens, and should not be used as the sole basis for treatment or other patient management decisions. Negative results must be combined with clinical observations, patient history, and epidemiological information. The expected result is Negative.  Fact Sheet for Patients: HairSlick.no  Fact Sheet for Healthcare Providers: quierodirigir.com  This test is not yet approved or cleared by the [place] FDA and  has been authorized for detection and/or diagnosis of  SARS-CoV-2 by FDA under an Emergency Use Authorization (EUA). This EUA will remain  in effect (meaning this test can be used) for the duration of the COVID-19 declaration under Se ction 564(b)(1) of the Act, 21 U.S.C. section 360bbb-3(b)(1), unless the authorization is terminated or revoked sooner.  Performed at [facility], [address], [place], [place] [address]   Culture, BAL-quantitative w Gram Stain     Status: Abnormal (Preliminary result)   Collection Time: 08/29/22  4:12 PM   Specimen: Bronchoalveolar Lavage; Respiratory  Result Value Ref Range Status   Specimen Description   Final    BRONCHIAL ALVEOLAR LAVAGE Performed at [facility], [address] [place], [place] [address]    Special Requests   Final    NONE Performed at [facility], [address] [place], [place] [address]    Gram Stain NO WBC SEEN NO ORGANISMS SEEN   Final   Culture (A)  Final    40,000 COLONIES/mL RALSTONIA PICKETTII SUSCEPTIBILITIES TO FOLLOW Performed at [facility], [address], [place], [place] [address]    Report Status PENDING  Incomplete  Acid Fast Smear (AFB)     Status: None   Collection Time: 08/29/22  4:12 PM   Specimen: Bronchoalveolar Lavage; Respiratory  Result Value Ref Range Status   AFB Specimen Processing Concentration  Final   Acid Fast Smear Negative  Final    Comment: (NOTE) Performed At: [facility] [address] [place], [place] [id number] Jolene Schimke MD NU:[id number]    Source (AFB) BRONCHIAL ALVEOLAR LAVAGE  Final    Comment: Performed at [facility], [address] [place], [place] [address]      Radiology Studies last 3 days: [facility] Chest [facility] 1 View  Result Date: 08/31/2022 CLINICAL DATA:  Left pneumothorax. EXAM: PORTABLE CHEST 1 VIEW COMPARISON:  Aug 31, 2022. FINDINGS: Stable cardiomediastinal silhouette with mild central pulmonary vascular congestion. Left-sided chest tube is again noted  without pneumothorax. Interstitial densities are noted throughout both lungs concerning for edema or atypical inflammation.  Bony thorax is unremarkable. IMPRESSION: Left-sided chest tube is noted without pneumothorax. Bilateral diffuse interstitial densities are noted concerning for edema or atypical inflammation. Electronically Signed   By: Lupita Raider M.D.   On: 08/31/2022 08:56   DG Chest Port 1 View  Result Date: 08/31/2022 CLINICAL DATA:  Left-sided pneumothorax EXAM: PORTABLE CHEST 1 VIEW COMPARISON:  Chest radiograph dated 08/30/2022 FINDINGS: Lines/tubes: Superomedial repositioning of left pleural catheter. Chest: Asymmetric near-complete atelectasis of the left lung. Persistent right interstitial and nodular opacities. Pleura: Increased large left pneumothorax.  No pleural effusion. Heart/mediastinum: Similar enlarged cardiomediastinal silhouette. Bones: No acute osseous abnormality. IMPRESSION: 1. Superomedial repositioning of left pleural catheter with increased large left pneumothorax, decreased on the subsequent radiograph. 2. Asymmetric near-complete atelectasis of the left lung. Electronically Signed   By: Agustin Cree M.D.   On: 08/31/2022 08:37   CT [facility] PLEURAL DRAIN W/INDWELL CATH W/IMG GUIDE  Result Date: 08/30/2022 INDICATION: Left pneumothorax EXAM: CT-guided left-sided chest tube placement TECHNIQUE: Multidetector CT imaging of the chest was performed following the standard protocol with/without IV contrast. RADIATION DOSE REDUCTION: This exam was performed according to the departmental dose-optimization program which includes automated exposure control, adjustment of the mA and/or kV according to patient size and/or use of iterative reconstruction technique. MEDICATIONS: Documented in the EMR ANESTHESIA/SEDATION: Local analgesia COMPLICATIONS: None immediate. PROCEDURE: Informed written consent was obtained from the patient after a thorough discussion of the procedural risks, benefits  and alternatives. All questions were addressed. Maximal Sterile Barrier Technique was utilized including caps, mask, sterile gowns, sterile gloves, sterile drape, hand hygiene and skin antiseptic. A timeout was performed prior to the initiation of the procedure. The patient was placed supine on the exam table. Limited CT of the chest was performed for planning purposes. This demonstrated large left pneumothorax. Skin entry site was marked, and the overlying skin was prepped and draped in the standard sterile fashion. Local analgesia was obtained with 1% lidocaine. Using intermittent CT fluoroscopy, a 19 gauge Yueh catheter was advanced into the left pleural space. Location was confirmed with CT and return of air. Over an Amplatz wire, the access tract was serially dilated to accommodate an 8 French locking pigtail catheter. Location was again confirmed with CT and return of air. Drainage catheter was secured to the skin using silk suture and a dressing. It was attached to a chest tube drainage system. Patient tolerated procedure well without immediate complication. IMPRESSION: Successful CT-guided placement of an 8 French locking chest tube in the left pleural space for treatment of pneumothorax. Electronically Signed   By: Olive Bass M.D.   On: 08/30/2022 10:56   Portable chest 1 View  Result Date: 08/30/2022 CLINICAL DATA:  [id number]. Follow-up pneumothorax post pigtail chest tube placement of the left. EXAM: PORTABLE CHEST 1 VIEW COMPARISON:  Portable chest yesterday at 3:31 p.m., chest CT without contrast 08/23/2022 FINDINGS: 5:18 a.m. Interval insertion of a small caliber pigtail tube thoracostomy in the left chest base. Left lung has reinflated except for a small pneumothorax visible along the lateral left thoracic margin estimated no more than 10% volume. The mediastinum is slightly shifted to the right but less than previously. Scattered multiple bilateral coarsely nodular pulmonary opacities continue to  be noted. There is patchy increased opacity in the left base which could be re-expansion edema or an area of pneumonia. No other new opacity is seen. There is mild cardiomegaly, no findings of acute CHF. Aortic tortuosity and atherosclerosis with stable mediastinum. Thoracic spondylosis.  IMPRESSION: 1. Re-expansion of the left lung following pigtail chest tube placement. 2. Small residual pneumothorax along the lateral left thoracic margin estimated no more than 10% volume. The mediastinum is still shifted to the right but less than previously. 3. Patchy increased opacity in the left base could be re-expansion edema or an area of pneumonia. 4. Multiple bilateral coarsely nodular pulmonary opacities. 5. Aortic atherosclerosis. Electronically Signed   By: Almira Bar M.D.   On: 08/30/2022 06:00   DG Chest Port 1 View  Result Date: 08/29/2022 CLINICAL DATA:  Post bronchoscopy EXAM: PORTABLE CHEST 1 VIEW COMPARISON:  Portable exam 1531 hours compared to 06/20/2022 FINDINGS: Large LEFT pneumothorax with complete collapse of LEFT lung. Mediastinal shift to RIGHT. Findings are consistent tension pneumothorax. Scattered infiltrate RIGHT lung. Osseous structures unremarkable. IMPRESSION: Large LEFT tension pneumothorax with complete collapse of LEFT lung and mediastinal shift to RIGHT. Critical Value/emergent results were called by telephone at the time of interpretation on 08/29/2022 at 3:42 pm to provider FUAD ALESKEROV , who verbally acknowledged these results. Electronically Signed   By: Ulyses Southward M.D.   On: 08/29/2022 15:43   DG C-Arm 1-60 Min-No Report  Result Date: 08/29/2022 Fluoroscopy was utilized by the requesting physician.  No radiographic interpretation.             LOS: 2 days       Sunnie Nielsen, DO [facility] 09/01/2022, 1:23 PM    Dictation software may have been used to generate the above note. Typos may occur and escape review in typed/dictated notes. Please  contact Dr Lyn Hollingshead directly for clarity if needed.  Staff may message me via secure chat in Epic  but this may not receive an immediate response,  please page me for urgent matters!  If 7PM-7AM, please contact night coverage [email or web address]

## 2022-09-02 ENCOUNTER — Inpatient Hospital Stay: Payer: PPO

## 2022-09-02 DIAGNOSIS — J939 Pneumothorax, unspecified: Secondary | ICD-10-CM | POA: Diagnosis not present

## 2022-09-02 LAB — CULTURE, BAL-QUANTITATIVE W GRAM STAIN: Culture: 40000 — AB

## 2022-09-02 NOTE — Progress Notes (Signed)
PROGRESS NOTE    Sarah Marquez   QIO:[id number] DOB: March 23, 1956  DOA: 08/29/2022 Date of Service: 09/02/22 PCP: Bosie Clos, MD     Brief Narrative / Hospital Course:  JULIAN MULLET is a 67 y.o. female w/ PMH chronic moderate persistent asthma, osteoarthritis of the neck, coronary artery disease history of skin basal cell carcinoma, carpal tunnel syndrome, colonic polyps, dyspnea on exertion, GERD, gout, dyslipidemia, essential hypertension, iron deficiency, obstructive sleep apnea, vitamin D deficiency. She was seen today by Dr Karna Christmas for bronchoscopy / evaluation hypermetabolic lung nodules (to ascertain cancer vs vasculitis).  05/01: bronchoscopy was successful but complicated by L pneumothorax. Pt stable on 3L O2. IR placed pleural drain. Hospitalist consulted for admission. 05/02: CXR AM shows reexpansion L lung except small PTX L thoracic margin no more than 10% volume. Off O2. Clamped chest tube, plan CXR tomorrow  05/03: recurrence PTX, chest tube back to suction, lung reinflated, pulmonary following, likely will keep here thru the weekend to allow resolution bronchopleural fistula, repeat CXR later today and tomorrow AM.  05/04: lung sounds present all fields, no SOB, doing well. CXR no PTX, L chest tube in place and clamped today by Dr Karna Christmas 05/05: CXR this am no concerns. Chest tube unable to be removed, d/t IR not available over the weekend in case needing replaced. Hopefully will be able to remove and discharge tomorrow. Remains clamped for now.   Consultants:  Pulmonary  IR  Procedures: 08/29/22 bronchoscopy w/ transbronchial and endobronchial biopsies for histologic evaluation, as well as therapeutic lavage/aspiration LUL mucus plugging,  08/29/22 placement L pigtail chest tube catheter       ASSESSMENT & PLAN:   Active Problems:             L pneumothorax             Lung mass uncertain etiology  Chronic Problems:             Essential HTN              GERD             Moderate Persistent Asthma              HLD             OSA                L pneumothorax, recurrent Chest tube in place, remove tomorrow (IR not here today) pulmonary following Follow serial CXR O2 support prn - has not needed this    Lung mass uncertain etiology  Pending biopsy results --> follow outpatient    Essential HTN Cont home losartan   GERD Cont home PPI   Moderate Persistent Asthma  Albuterol prn    HLD Follow outpatient   OSA CPAP qhs          DVT prophylaxis: lovenox  Pertinent IV fluids/nutrition: no continuous IV fluids, regular diet  Central lines / invasive devices: L chest tube   Code Status: FULL CODE ACP documentation reviewed: 05/01, none on file in [facility]   Current Admission Status: inpatient  TOC needs / Dispo plan: none Barriers to discharge / significant pending items: will be here thru the weekend penidng perisstent resolution PTX          Subjective / Brief ROS:  Patient reports feeling well today Denies CP/SOB.  Pain controlled.  Denies new weakness.  Tolerating diet.    Family Communication: none at this time  Objective Findings:  Vitals:   09/01/22 1518 09/01/22 2031 09/02/22 0448 09/02/22 0859  BP: (!) 117/52 (!) 109/59 (!) 116/57 120/68  Pulse: 74 82 73 75  Resp: 20 20 16 18   Temp: 98 F (36.7 C) 98.6 F (37 C) 97.8 F (36.6 C) 98.7 F (37.1 C)  TempSrc:    Oral  SpO2: 95% 99% 95% 96%  Weight:      Height:        Intake/Output Summary (Last 24 hours) at 09/02/2022 1313 Last data filed at 09/02/2022 0600 Gross per 24 hour  Intake 120 ml  Output 0 ml  Net 120 ml   Filed Weights   08/29/22 1106  Weight: 108.9 kg    Examination:  Physical Exam Constitutional:      General: She is not in acute distress. Cardiovascular:     Rate and Rhythm: Normal rate and regular rhythm.  Pulmonary:     Effort: Pulmonary effort is normal. No respiratory distress.     Breath sounds: Normal  breath sounds.  Skin:    General: Skin is warm and dry.  Neurological:     General: No focal deficit present.     Mental Status: She is alert and oriented to person, place, and time.  Psychiatric:        Mood and Affect: Mood normal.        Behavior: Behavior normal.          Scheduled Medications:   acetaminophen  650 mg Oral BID   enoxaparin (LOVENOX) injection  0.5 mg/kg Subcutaneous Q24H   losartan  50 mg Oral Daily   montelukast  10 mg Oral Daily   multivitamin with minerals  1 tablet Oral Daily   pantoprazole  40 mg Oral BID    Continuous Infusions:   PRN Medications:  albuterol, benzonatate, bisacodyl, morphine injection, ondansetron **OR** ondansetron (ZOFRAN) IV, oxyCODONE, senna-docusate, traZODone  Antimicrobials from admission:  Anti-infectives (From admission, onward)    None           Data Reviewed:  I have personally reviewed the following...  CBC: Recent Labs  Lab 08/29/22 1741 08/30/22 0550 09/01/22 0335  WBC 6.8 9.9 7.2  HGB 13.0 11.7* 11.5*  HCT 40.4 37.1 37.1  MCV 78.9* 79.1* 81.2  PLT 194 193 172   Basic Metabolic Panel: Recent Labs  Lab 08/29/22 1741 08/30/22 0550  NA  --  138  K  --  4.1  CL  --  105  CO2  --  25  GLUCOSE  --  118*  BUN  --  25*  CREATININE 1.12* 0.92  CALCIUM  --  9.0   GFR: Estimated Creatinine Clearance: 71.9 mL/min (by C-G formula based on SCr of 0.92 mg/dL). Liver Function Tests: No results for input(s): "AST", "ALT", "ALKPHOS", "BILITOT", "PROT", "ALBUMIN" in the last 168 hours. No results for input(s): "LIPASE", "AMYLASE" in the last 168 hours. No results for input(s): "AMMONIA" in the last 168 hours. Coagulation Profile: No results for input(s): "INR", "PROTIME" in the last 168 hours. Cardiac Enzymes: No results for input(s): "CKTOTAL", "CKMB", "CKMBINDEX", "TROPONINI" in the last 168 hours. BNP (last 3 results) No results for input(s): "PROBNP" in the last 8760 hours. HbA1C: No  results for input(s): "HGBA1C" in the last 72 hours. CBG: No results for input(s): "GLUCAP" in the last 168 hours. Lipid Profile: No results for input(s): "CHOL", "HDL", "LDLCALC", "TRIG", "CHOLHDL", "LDLDIRECT" in the last 72 hours. Thyroid Function Tests: No results for input(s): "  TSH", "T4TOTAL", "FREET4", "T3FREE", "THYROIDAB" in the last 72 hours. Anemia Panel: No results for input(s): "VITAMINB12", "FOLATE", "FERRITIN", "TIBC", "IRON", "RETICCTPCT" in the last 72 hours. Most Recent Urinalysis On File:     Component Value Date/Time   COLORURINE YELLOW (A) 06/22/2021 1019   APPEARANCEUR HAZY (A) 06/22/2021 1019   LABSPEC 1.016 06/22/2021 1019   PHURINE 6.0 06/22/2021 1019   GLUCOSEU NEGATIVE 06/22/2021 1019   HGBUR NEGATIVE 06/22/2021 1019   BILIRUBINUR NEGATIVE 06/22/2021 1019   BILIRUBINUR negative 02/12/2020 1035   KETONESUR NEGATIVE 06/22/2021 1019   PROTEINUR NEGATIVE 06/22/2021 1019   UROBILINOGEN 0.2 02/12/2020 1035   NITRITE NEGATIVE 06/22/2021 1019   LEUKOCYTESUR MODERATE (A) 06/22/2021 1019   Sepsis Labs: @LABRCNTIP (procalcitonin:4,lacticidven:4) Microbiology: Recent Results (from the past 240 hour(s))  SARS CORONAVIRUS 2 (TAT 6-24 HRS) Anterior Nasal Swab     Status: None   Collection Time: 08/27/22  3:25 PM   Specimen: Anterior Nasal Swab  Result Value Ref Range Status   SARS Coronavirus 2 NEGATIVE NEGATIVE Final    Comment: (NOTE) SARS-CoV-2 target nucleic acids are NOT DETECTED.  The SARS-CoV-2 RNA is generally detectable in upper and lower respiratory specimens during the acute phase of infection. Negative results do not preclude SARS-CoV-2 infection, do not rule out co-infections with other pathogens, and should not be used as the sole basis for treatment or other patient management decisions. Negative results must be combined with clinical observations, patient history, and epidemiological information. The expected result is Negative.  Fact Sheet  for Patients: HairSlick.no  Fact Sheet for Healthcare Providers: quierodirigir.com  This test is not yet approved or cleared by the [place] FDA and  has been authorized for detection and/or diagnosis of SARS-CoV-2 by FDA under an Emergency Use Authorization (EUA). This EUA will remain  in effect (meaning this test can be used) for the duration of the COVID-19 declaration under Se ction 564(b)(1) of the Act, 21 U.S.C. section 360bbb-3(b)(1), unless the authorization is terminated or revoked sooner.  Performed at [facility], [address], [place], [place] [address]   Culture, BAL-quantitative w Gram Stain     Status: Abnormal   Collection Time: 08/29/22  4:12 PM   Specimen: Bronchoalveolar Lavage; Respiratory  Result Value Ref Range Status   Specimen Description   Final    BRONCHIAL ALVEOLAR LAVAGE Performed at [facility], [address] [place], [place] [address]    Special Requests   Final    NONE Performed at [facility], [address] [place], [place] [address]    Gram Stain NO WBC SEEN NO ORGANISMS SEEN   Final   Culture (A)  Final    40,000 COLONIES/mL RALSTONIA PICKETTII 20,000 COLONIES/mL RALSTONIA INSIDIOSA Performed at [facility], [address], [place], [place] [address]    Report Status 09/02/2022 FINAL  Final   Organism ID, Bacteria RALSTONIA PICKETTII (A)  Final   Organism ID, Bacteria RALSTONIA INSIDIOSA (A)  Final      Susceptibility   Ralstonia pickettii - MIC*    CEFTAZIDIME 8 SENSITIVE Sensitive     CIPROFLOXACIN <=0.25 SENSITIVE Sensitive     GENTAMICIN 2 SENSITIVE Sensitive     IMIPENEM <=0.25 SENSITIVE Sensitive     TRIMETH/SULFA <=20 SENSITIVE Sensitive     PIP/TAZO 16 SENSITIVE Sensitive     * 40,000 COLONIES/mL RALSTONIA PICKETTII   Ralstonia insidiosa - MIC*    CEFTAZIDIME 16 INTERMEDIATE Intermediate     CIPROFLOXACIN <=0.25 SENSITIVE  Sensitive     GENTAMICIN >=16 RESISTANT Resistant     IMIPENEM 2 SENSITIVE Sensitive     TRIMETH/SULFA <=20 SENSITIVE Sensitive     PIP/TAZO >=128 RESISTANT Resistant     * 20,000 COLONIES/mL RALSTONIA INSIDIOSA  Acid Fast Smear (AFB)     Status: None   Collection Time: 08/29/22  4:12 PM   Specimen: Bronchoalveolar Lavage; Respiratory  Result Value Ref Range Status   AFB Specimen Processing Concentration  Final   Acid Fast Smear Negative  Final    Comment: (NOTE) Performed At: [facility] [address] [place], [place] [id number] Jolene Schimke MD WU:[id number]    Source (AFB) BRONCHIAL ALVEOLAR LAVAGE  Final    Comment: Performed at [facility], [address] [place], [place] [address]      Radiology Studies last 3 days: DG Chest Bilateral Decubitus  Result Date: 09/02/2022 CLINICAL DATA:  67 year old female with left-side chest 2 placed for pneumothorax. EXAM: CHEST - BILATERAL DECUBITUS VIEW COMPARISON:  Portable chest 09/01/2022 and earlier. FINDINGS: Lateral decubitus views of both sides of the chest 0841 hours. Right side down lateral decubitus view is unremarkable, no pleural edge is visible in the left lung and the left costophrenic angle is clear. Left-side-down lateral decubitus view cannot exclude trace layering pleural effusion on that side. Non dependent right lung appears clear. Mediastinal contours are stable. Visualized tracheal air column is within normal limits. Negative visible bowel gas. Stable cholecystectomy clips. IMPRESSION: 1. No pneumothorax identified on bilateral lateral decubitus views. Possible trace mobile left pleural effusion. 2. Small caliber left chest tube remains in place. Electronically Signed   By: Odessa Fleming M.D.   On: 09/02/2022 08:54   DG Chest Port 1 View  Result Date: 09/01/2022 CLINICAL DATA:  [id number] Pneumothorax [id number] EXAM: PORTABLE CHEST 1 VIEW COMPARISON:  Aug 31, 2022 FINDINGS: The cardiomediastinal silhouette is  unchanged in contour.LEFT-sided chest tube. No pleural effusion. No significant pneumothorax. Similar background of reticular opacities and peribronchial cuffing. No acute pleuroparenchymal abnormality. IMPRESSION: LEFT-sided chest tube in place. No significant pneumothorax. Electronically Signed   By: Meda Klinefelter M.D.   On: 09/01/2022 19:42   DG Chest Port 1 View  Result Date: 08/31/2022 CLINICAL DATA:  Left pneumothorax. EXAM: PORTABLE CHEST 1 VIEW COMPARISON:  Aug 31, 2022. FINDINGS: Stable cardiomediastinal silhouette with mild central pulmonary vascular congestion. Left-sided chest tube is again noted without pneumothorax. Interstitial densities are noted throughout both lungs concerning for edema or atypical inflammation. Bony thorax is unremarkable. IMPRESSION: Left-sided chest tube is noted without pneumothorax. Bilateral diffuse interstitial densities are noted concerning for edema or atypical inflammation. Electronically Signed   By: Lupita Raider M.D.   On: 08/31/2022 08:56   DG Chest Port 1 View  Result Date: 08/31/2022 CLINICAL DATA:  Left-sided pneumothorax EXAM: PORTABLE CHEST 1 VIEW COMPARISON:  Chest radiograph dated 08/30/2022 FINDINGS: Lines/tubes: Superomedial repositioning of left pleural catheter. Chest: Asymmetric near-complete atelectasis of the left lung. Persistent right interstitial and nodular opacities. Pleura: Increased large left pneumothorax.  No pleural effusion. Heart/mediastinum: Similar enlarged cardiomediastinal silhouette. Bones: No acute osseous abnormality. IMPRESSION: 1. Superomedial repositioning of left pleural catheter with increased large left pneumothorax, decreased on the subsequent radiograph. 2. Asymmetric near-complete atelectasis of the left lung. Electronically Signed   By: Agustin Cree M.D.   On: 08/31/2022 08:37   CT [facility] PLEURAL DRAIN W/INDWELL CATH W/IMG GUIDE  Result Date: 08/30/2022 INDICATION: Left pneumothorax EXAM: CT-guided left-sided chest  tube placement TECHNIQUE: Multidetector  CT imaging of the chest was performed following the standard protocol with/without IV contrast. RADIATION DOSE REDUCTION: This exam was performed according to the departmental dose-optimization program which includes automated exposure control, adjustment of the mA and/or kV according to patient size and/or use of iterative reconstruction technique. MEDICATIONS: Documented in the EMR ANESTHESIA/SEDATION: Local analgesia COMPLICATIONS: None immediate. PROCEDURE: Informed written consent was obtained from the patient after a thorough discussion of the procedural risks, benefits and alternatives. All questions were addressed. Maximal Sterile Barrier Technique was utilized including caps, mask, sterile gowns, sterile gloves, sterile drape, hand hygiene and skin antiseptic. A timeout was performed prior to the initiation of the procedure. The patient was placed supine on the exam table. Limited CT of the chest was performed for planning purposes. This demonstrated large left pneumothorax. Skin entry site was marked, and the overlying skin was prepped and draped in the standard sterile fashion. Local analgesia was obtained with 1% lidocaine. Using intermittent CT fluoroscopy, a 19 gauge Yueh catheter was advanced into the left pleural space. Location was confirmed with CT and return of air. Over an Amplatz wire, the access tract was serially dilated to accommodate an 8 French locking pigtail catheter. Location was again confirmed with CT and return of air. Drainage catheter was secured to the skin using silk suture and a dressing. It was attached to a chest tube drainage system. Patient tolerated procedure well without immediate complication. IMPRESSION: Successful CT-guided placement of an 8 French locking chest tube in the left pleural space for treatment of pneumothorax. Electronically Signed   By: Olive Bass M.D.   On: 08/30/2022 10:56   Portable chest 1 View  Result  Date: 08/30/2022 CLINICAL DATA:  [id number]. Follow-up pneumothorax post pigtail chest tube placement of the left. EXAM: PORTABLE CHEST 1 VIEW COMPARISON:  Portable chest yesterday at 3:31 p.m., chest CT without contrast 08/23/2022 FINDINGS: 5:18 a.m. Interval insertion of a small caliber pigtail tube thoracostomy in the left chest base. Left lung has reinflated except for a small pneumothorax visible along the lateral left thoracic margin estimated no more than 10% volume. The mediastinum is slightly shifted to the right but less than previously. Scattered multiple bilateral coarsely nodular pulmonary opacities continue to be noted. There is patchy increased opacity in the left base which could be re-expansion edema or an area of pneumonia. No other new opacity is seen. There is mild cardiomegaly, no findings of acute CHF. Aortic tortuosity and atherosclerosis with stable mediastinum. Thoracic spondylosis. IMPRESSION: 1. Re-expansion of the left lung following pigtail chest tube placement. 2. Small residual pneumothorax along the lateral left thoracic margin estimated no more than 10% volume. The mediastinum is still shifted to the right but less than previously. 3. Patchy increased opacity in the left base could be re-expansion edema or an area of pneumonia. 4. Multiple bilateral coarsely nodular pulmonary opacities. 5. Aortic atherosclerosis. Electronically Signed   By: Almira Bar M.D.   On: 08/30/2022 06:00   DG Chest Port 1 View  Result Date: 08/29/2022 CLINICAL DATA:  Post bronchoscopy EXAM: PORTABLE CHEST 1 VIEW COMPARISON:  Portable exam 1531 hours compared to 06/20/2022 FINDINGS: Large LEFT pneumothorax with complete collapse of LEFT lung. Mediastinal shift to RIGHT. Findings are consistent tension pneumothorax. Scattered infiltrate RIGHT lung. Osseous structures unremarkable. IMPRESSION: Large LEFT tension pneumothorax with complete collapse of LEFT lung and mediastinal shift to RIGHT. Critical  Value/emergent results were called by telephone at the time of interpretation on 08/29/2022 at 3:42 pm to provider  FUAD ALESKEROV , who verbally acknowledged these results. Electronically Signed   By: Ulyses Southward M.D.   On: 08/29/2022 15:43   DG C-Arm 1-60 Min-No Report  Result Date: 08/29/2022 Fluoroscopy was utilized by the requesting physician.  No radiographic interpretation.             LOS: 3 days       Sunnie Nielsen, DO [facility] 09/02/2022, 1:13 PM    Dictation software may have been used to generate the above note. Typos may occur and escape review in typed/dictated notes. Please contact Dr Lyn Hollingshead directly for clarity if needed.  Staff may message me via secure chat in Epic  but this may not receive an immediate response,  please page me for urgent matters!  If 7PM-7AM, please contact night coverage [email or web address]

## 2022-09-02 NOTE — Progress Notes (Signed)
PULMONOLOGY         Date: 09/02/2022,   MRN# [id number] Sarah Marquez Nov 28, 1955     AdmissionWeight: 108.9 kg                 CurrentWeight: 108.9 kg  Referring provider: Dr Jenne Campus   CHIEF COMPLAINT:   Hypermetabolic lung nodules   HISTORY OF PRESENT ILLNESS   67 year old female with a history of chronic moderate persistent asthma, osteoarthritis of the neck, coronary artery disease history of skin basal cell carcinoma, carpal tunnel syndrome, colonic polyps, dyspnea on exertion, GERD, gout, dyslipidemia, essential hypertension, iron deficiency, obstructive sleep apnea, vitamin D deficiency for reevaluation of hypermetabolic lung nodules.  Specifically question remains if this possibly cancer versus vasculitis as therapy is quite different.  Patient did report improvement after prednisone however this therapy is generally temporary and patient needs definitive treatment whether this is a vasculitis or not.  Additionally any mucous plugging will be removed via aspiration, bronchoalveolar lavage will be performed for additional microbiology transbronchial and endobronchial biopsies will be performed for histologic evaluation for atypia including carcinoma as well as organizing pneumonia versus vasculitis.  -08/30/22- patient developed pneumothorax on left after FNA during bronch yesterday.  She is on room air with only 10%..  She is doing well.  Mild wheezing , have ordered albuterol.  I have clamped chest tube and repeat cxr has been ordered.   08/31/22 - Patient seen this am, she had non resolved left pneumothorax post clamping of tube suction.  I have since re-inflated the lung and have ordered another cxr for today. This should be much improved.  Will need to keep patient for some time to allow bronchopleural fistula to resolve. She is in no distress , slept well with no acute events  09/01/22- chest tube clamped today.  Patient has no residual pneumothorax.  Will repeat cxr in am  to confirm no recurrence post clamping of chest tube overnight. Plan to hopefully dc home Sunday 09/02/22  09/02/22- reviewed chest x ray from this morning.  There is no residual pneumothorax.  Patient has had chest tube clamped with suction off overnight.  She has had no symptoms and is stable for dc post removal of chest pleural drain. Pulmonary will follow up with her regarding lung biopsy results on outpatient basis.    PAST MEDICAL HISTORY   Past Medical History:  Diagnosis Date   Arthritis    neck, knees   Asthma    Calcification of right carotid artery    Cancer of the skin, basal cell    a.) s/p excision from neck, back, and foot   Carpal tunnel syndrome    Colon polyps    Difficult intubation    Dyspnea    Gastric ulcer    GERD (gastroesophageal reflux disease)    Gout    History of 2019 novel coronavirus disease (COVID-19) 10/21/2020   Hyperlipidemia    Hypertension    Iron deficiency    Iron deficiency anemia    OSA (obstructive sleep apnea)    a.) does not require nocturnal PAP therapy   Pulmonary nodule    Vertigo    last episode 12/2018   Vitamin D deficiency    Wears dentures    partial upper     SURGICAL HISTORY   Past Surgical History:  Procedure Laterality Date   ABDOMINAL HYSTERECTOMY     APPENDECTOMY     BACK SURGERY     x2 L5  FOR RUPTURED DISC   CARDIAC CATHETERIZATION  1990's   CATARACT EXTRACTION W/PHACO Left 04/07/2019   Procedure: CATARACT EXTRACTION PHACO AND INTRAOCULAR LENS PLACEMENT (IOC) LEFT 4.43, 00:34.1;  Surgeon: Galen Manila, MD;  Location: [facility];  Service: Ophthalmology;  Laterality: Left;  Sleep Apnea-does not wear CPAP Latex-adhesives   CATARACT EXTRACTION W/PHACO Right 05/05/2019   Procedure: CATARACT EXTRACTION PHACO AND INTRAOCULAR LENS PLACEMENT (IOC) RIGHT;  4.83, 00:33.4;  Surgeon: Galen Manila, MD;  Location: [facility];  Service: Ophthalmology;  Laterality: Right;  SLEP APNEA-CPAP    CHOLECYSTECTOMY     COLONOSCOPY     2006, 2009, 2014, 2019   COLONOSCOPY WITH PROPOFOL N/A 10/16/2017   Procedure: COLONOSCOPY WITH PROPOFOL;  Surgeon: Scot Jun, MD;  Location: [facility];  Service: Endoscopy;  Laterality: N/A;   ESOPHAGOGASTRODUODENOSCOPY     2006, 2014, 2015,2019   ESOPHAGOGASTRODUODENOSCOPY (EGD) WITH PROPOFOL N/A 10/16/2017   Procedure: ESOPHAGOGASTRODUODENOSCOPY (EGD) WITH PROPOFOL;  Surgeon: Scot Jun, MD;  Location: [facility] ENDOSCOPY;  Service: Endoscopy;  Laterality: N/A;   EUS N/A 08/09/2022   Procedure: UPPER ENDOSCOPIC ULTRASOUND (EUS) LINEAR;  Surgeon: Bearl Mulberry, MD;  Location: [facility] ENDOSCOPY;  Service: Gastroenterology;  Laterality: N/A;   FUNCTIONAL ENDOSCOPIC SINUS SURGERY     JOINT REPLACEMENT     KNEE ARTHROSCOPY Right 11/30/2002   LUMBAR DISC SURGERY     ruptured disc x 2   TONSILLECTOMY AND ADENOIDECTOMY     TOTAL KNEE ARTHROPLASTY Right 10/25/2020   Procedure: TOTAL KNEE ARTHROPLASTY - [place] Neighbor to assist;  Surgeon: Kennedy Bucker, MD;  Location: [facility] ORS;  Service: Orthopedics;  Laterality: Right;   TOTAL KNEE ARTHROPLASTY Left 07/06/2021   Procedure: TOTAL KNEE ARTHROPLASTY;  Surgeon: Kennedy Bucker, MD;  Location: [facility] ORS;  Service: Orthopedics;  Laterality: Left;   VIDEO BRONCHOSCOPY WITH ENDOBRONCHIAL ULTRASOUND N/A 06/20/2022   Procedure: VIDEO BRONCHOSCOPY WITH ENDOBRONCHIAL ULTRASOUND;  Surgeon: Vida Rigger, MD;  Location: [facility] ORS;  Service: Thoracic;  Laterality: N/A;   VIDEO BRONCHOSCOPY WITH ENDOBRONCHIAL ULTRASOUND N/A 08/29/2022   Procedure: VIDEO BRONCHOSCOPY WITH ENDOBRONCHIAL ULTRASOUND;  Surgeon: Vida Rigger, MD;  Location: [facility] ORS;  Service: Thoracic;  Laterality: N/A;     FAMILY HISTORY   Family History  Problem Relation Age of Onset   Breast cancer Mother 4   Heart attack Father    Heart attack Brother      SOCIAL HISTORY   Social History   Tobacco Use   Smoking status: Never    Smokeless tobacco: Never   Tobacco comments:    former social smoker  Vaping Use   Vaping Use: Never used  Substance Use Topics   Alcohol use: No    Alcohol/week: 0.0 standard drinks of alcohol   Drug use: No     MEDICATIONS    Home Medication:    Current Medication:  Current Facility-Administered Medications:    acetaminophen (TYLENOL) tablet 650 mg, 650 mg, Oral, BID, Karna Christmas, Rieley Hausman, MD, 650 mg at 09/01/22 2211   albuterol (PROVENTIL) (2.5 MG/3ML) 0.083% nebulizer solution 2.5 mg, 2.5 mg, Nebulization, Q6H PRN, Vida Rigger, MD   benzonatate (TESSALON) capsule 200 mg, 200 mg, Oral, BID PRN, Sunnie Nielsen, DO   bisacodyl (DULCOLAX) EC tablet 5 mg, 5 mg, Oral, Daily PRN, Sunnie Nielsen, DO   enoxaparin (LOVENOX) injection 55 mg, 0.5 mg/kg, Subcutaneous, Q24H, Prince Solian F, RPH, 55 mg at 09/01/22 1029   losartan (COZAAR) tablet 50 mg, 50 mg, Oral, Daily, Sunnie Nielsen, DO, 50  mg at 09/01/22 1026   montelukast (SINGULAIR) tablet 10 mg, 10 mg, Oral, Daily, Sunnie Nielsen, DO, 10 mg at 09/01/22 1026   morphine (PF) 2 MG/ML injection 2 mg, 2 mg, Intravenous, Q2H PRN, Sunnie Nielsen, DO   multivitamin with minerals tablet 1 tablet, 1 tablet, Oral, Daily, Sunnie Nielsen, DO, 1 tablet at 09/01/22 1026   ondansetron (ZOFRAN) tablet 4 mg, 4 mg, Oral, Q6H PRN **OR** ondansetron (ZOFRAN) injection 4 mg, 4 mg, Intravenous, Q6H PRN, Sunnie Nielsen, DO   oxyCODONE (Oxy IR/ROXICODONE) immediate release tablet 5 mg, 5 mg, Oral, Q4H PRN, Sunnie Nielsen, DO   pantoprazole (PROTONIX) EC tablet 40 mg, 40 mg, Oral, BID, Sunnie Nielsen, DO, 40 mg at 09/01/22 2211   senna-docusate (Senokot-S) tablet 1 tablet, 1 tablet, Oral, QHS PRN, Sunnie Nielsen, DO   traZODone (DESYREL) tablet 25 mg, 25 mg, Oral, QHS PRN, Sunnie Nielsen, DO    ALLERGIES   Albuterol, Beclomethasone, Sulfa antibiotics, Lodine [etodolac], Latex, and Meloxicam     REVIEW OF  SYSTEMS    Review of Systems:  Gen:  Denies  fever, sweats, chills weigh loss  HEENT: Denies blurred vision, double vision, ear pain, eye pain, hearing loss, nose bleeds, sore throat Cardiac:  No dizziness, chest pain or heaviness, chest tightness,edema Resp:   reports dyspnea chronically  Gi: Denies swallowing difficulty, stomach pain, nausea or vomiting, diarrhea, constipation, bowel incontinence Gu:  Denies bladder incontinence, burning urine Ext:   Denies Joint pain, stiffness or swelling Skin: Denies  skin rash, easy bruising or bleeding or hives Endoc:  Denies polyuria, polydipsia , polyphagia or weight change Psych:   Denies depression, insomnia or hallucinations   Other:  All other systems negative   VS: BP 120/68 (BP Location: Left Arm)   Pulse 75   Temp 98.7 F (37.1 C) (Oral)   Resp 18   Ht 5\' 5"  (1.651 m)   Wt 108.9 kg   SpO2 96%   BMI 39.94 kg/m      PHYSICAL EXAM    GENERAL:NAD, no fevers, chills, no weakness no fatigue HEAD: Normocephalic, atraumatic.  EYES: Pupils equal, round, reactive to light. Extraocular muscles intact. No scleral icterus.  MOUTH: Moist mucosal membrane. Dentition intact. No abscess noted.  EAR, NOSE, THROAT: Clear without exudates. No external lesions.  NECK: Supple. No thyromegaly. No nodules. No JVD.  PULMONARY: decreased breath sounds with mild rhonchi worse at bases bilaterally.  CARDIOVASCULAR: S1 and S2. Regular rate and rhythm. No murmurs, rubs, or gallops. No edema. Pedal pulses 2+ bilaterally.  GASTROINTESTINAL: Soft, nontender, nondistended. No masses. Positive bowel sounds. No hepatosplenomegaly.  MUSCULOSKELETAL: No swelling, clubbing, or edema. Range of motion full in all extremities.  NEUROLOGIC: Cranial nerves II through XII are intact. No gross focal neurological deficits. Sensation intact. Reflexes intact.  SKIN: No ulceration, lesions, rashes, or cyanosis. Skin warm and dry. Turgor intact.  PSYCHIATRIC: Mood,  affect within normal limits. The patient is awake, alert and oriented x 3. Insight, judgment intact.       IMAGING     ASSESSMENT/PLAN   Bilateral pulmonary nodules  -s/p bronchoscopy with FNA induced left pneumothorax   Left secondary pneumothorax - chest tube management - no air leak, have clamped tube will repeat CXR in am. Patient on room air , asymptomatic  - repeat cxr this morning with non-resolved pneumothorax , I have reinflated lung after this and have another x ray pending for today.     Thank you for allowing me to participate  in the care of this patient.   Patient/Family are satisfied with care plan and all questions have been answered.    Provider disclosure: Patient with at least one acute or chronic illness or injury that poses a threat to life or bodily function and is being managed actively during this encounter.  All of the below services have been performed independently by signing provider:  review of prior documentation from internal and or external health records.  Review of previous and current lab results.  Interview and comprehensive assessment during patient visit today. Review of current and previous chest radiographs/CT scans. Discussion of management and test interpretation with health care team and patient/family.   This document was prepared using Dragon voice recognition software and may include unintentional dictation errors.     Vida Rigger, M.D.  Division of Pulmonary & Critical Care Medicine

## 2022-09-03 ENCOUNTER — Inpatient Hospital Stay: Payer: PPO

## 2022-09-03 ENCOUNTER — Inpatient Hospital Stay: Payer: PPO | Admitting: Radiology

## 2022-09-03 DIAGNOSIS — J939 Pneumothorax, unspecified: Secondary | ICD-10-CM | POA: Diagnosis not present

## 2022-09-03 HISTORY — PX: IR RADIOLOGIST EVAL & MGMT: IMG5224

## 2022-09-03 LAB — CHLAMYDIA CULTURE: Chlamydia Trachomatis Culture: NEGATIVE

## 2022-09-03 NOTE — Progress Notes (Signed)
PULMONOLOGY         Date: 09/03/2022,   MRN# [id number] Sarah Marquez December 24, 1955     AdmissionWeight: 108.9 kg                 CurrentWeight: 108.9 kg  Referring provider: Dr Jenne Campus   CHIEF COMPLAINT:   Hypermetabolic lung nodules   HISTORY OF PRESENT ILLNESS   67 year old female with a history of chronic moderate persistent asthma, osteoarthritis of the neck, coronary artery disease history of skin basal cell carcinoma, carpal tunnel syndrome, colonic polyps, dyspnea on exertion, GERD, gout, dyslipidemia, essential hypertension, iron deficiency, obstructive sleep apnea, vitamin D deficiency for reevaluation of hypermetabolic lung nodules.  Specifically question remains if this possibly cancer versus vasculitis as therapy is quite different.  Patient did report improvement after prednisone however this therapy is generally temporary and patient needs definitive treatment whether this is a vasculitis or not.  Additionally any mucous plugging will be removed via aspiration, bronchoalveolar lavage will be performed for additional microbiology transbronchial and endobronchial biopsies will be performed for histologic evaluation for atypia including carcinoma as well as organizing pneumonia versus vasculitis.  -08/30/22- patient developed pneumothorax on left after FNA during bronch yesterday.  She is on room air with only 10%..  She is doing well.  Mild wheezing , have ordered albuterol.  I have clamped chest tube and repeat cxr has been ordered.   08/31/22 - Patient seen this am, she had non resolved left pneumothorax post clamping of tube suction.  I have since re-inflated the lung and have ordered another cxr for today. This should be much improved.  Will need to keep patient for some time to allow bronchopleural fistula to resolve. She is in no distress , slept well with no acute events  09/01/22- chest tube clamped today.  Patient has no residual pneumothorax.  Will repeat cxr in am  to confirm no recurrence post clamping of chest tube overnight. Plan to hopefully dc home Sunday 09/02/22  09/02/22- reviewed chest x ray from this morning.  There is no residual pneumothorax.  Patient has had chest tube clamped with suction off overnight.  She has had no symptoms and is stable for dc post removal of chest pleural drain. Pulmonary will follow up with her regarding lung biopsy results on outpatient basis.   09/03/22- patient is being evaluated for removal of chest tube and optimized for dc home.  She is in stable condition on room air.  CXR reviewed with no pneumonthorax  PAST MEDICAL HISTORY   Past Medical History:  Diagnosis Date   Arthritis    neck, knees   Asthma    Calcification of right carotid artery    Cancer of the skin, basal cell    a.) s/p excision from neck, back, and foot   Carpal tunnel syndrome    Colon polyps    Difficult intubation    Dyspnea    Gastric ulcer    GERD (gastroesophageal reflux disease)    Gout    History of 2019 novel coronavirus disease (COVID-19) 10/21/2020   Hyperlipidemia    Hypertension    Iron deficiency    Iron deficiency anemia    OSA (obstructive sleep apnea)    a.) does not require nocturnal PAP therapy   Pulmonary nodule    Vertigo    last episode 12/2018   Vitamin D deficiency    Wears dentures    partial upper     SURGICAL HISTORY  Past Surgical History:  Procedure Laterality Date   ABDOMINAL HYSTERECTOMY     APPENDECTOMY     BACK SURGERY     x2 L5 FOR RUPTURED DISC   CARDIAC CATHETERIZATION  1990's   CATARACT EXTRACTION W/PHACO Left 04/07/2019   Procedure: CATARACT EXTRACTION PHACO AND INTRAOCULAR LENS PLACEMENT (IOC) LEFT 4.43, 00:34.1;  Surgeon: Galen Manila, MD;  Location: [facility];  Service: Ophthalmology;  Laterality: Left;  Sleep Apnea-does not wear CPAP Latex-adhesives   CATARACT EXTRACTION W/PHACO Right 05/05/2019   Procedure: CATARACT EXTRACTION PHACO AND INTRAOCULAR LENS PLACEMENT  (IOC) RIGHT;  4.83, 00:33.4;  Surgeon: Galen Manila, MD;  Location: [facility];  Service: Ophthalmology;  Laterality: Right;  SLEP APNEA-CPAP   CHOLECYSTECTOMY     COLONOSCOPY     2006, 2009, 2014, 2019   COLONOSCOPY WITH PROPOFOL N/A 10/16/2017   Procedure: COLONOSCOPY WITH PROPOFOL;  Surgeon: Scot Jun, MD;  Location: [facility] ENDOSCOPY;  Service: Endoscopy;  Laterality: N/A;   ESOPHAGOGASTRODUODENOSCOPY     2006, 2014, 2015,2019   ESOPHAGOGASTRODUODENOSCOPY (EGD) WITH PROPOFOL N/A 10/16/2017   Procedure: ESOPHAGOGASTRODUODENOSCOPY (EGD) WITH PROPOFOL;  Surgeon: Scot Jun, MD;  Location: [facility] ENDOSCOPY;  Service: Endoscopy;  Laterality: N/A;   EUS N/A 08/09/2022   Procedure: UPPER ENDOSCOPIC ULTRASOUND (EUS) LINEAR;  Surgeon: Bearl Mulberry, MD;  Location: [facility] ENDOSCOPY;  Service: Gastroenterology;  Laterality: N/A;   FUNCTIONAL ENDOSCOPIC SINUS SURGERY     JOINT REPLACEMENT     KNEE ARTHROSCOPY Right 11/30/2002   LUMBAR DISC SURGERY     ruptured disc x 2   TONSILLECTOMY AND ADENOIDECTOMY     TOTAL KNEE ARTHROPLASTY Right 10/25/2020   Procedure: TOTAL KNEE ARTHROPLASTY - [place] Neighbor to assist;  Surgeon: Kennedy Bucker, MD;  Location: [facility] ORS;  Service: Orthopedics;  Laterality: Right;   TOTAL KNEE ARTHROPLASTY Left 07/06/2021   Procedure: TOTAL KNEE ARTHROPLASTY;  Surgeon: Kennedy Bucker, MD;  Location: [facility] ORS;  Service: Orthopedics;  Laterality: Left;   VIDEO BRONCHOSCOPY WITH ENDOBRONCHIAL ULTRASOUND N/A 06/20/2022   Procedure: VIDEO BRONCHOSCOPY WITH ENDOBRONCHIAL ULTRASOUND;  Surgeon: Vida Rigger, MD;  Location: [facility] ORS;  Service: Thoracic;  Laterality: N/A;   VIDEO BRONCHOSCOPY WITH ENDOBRONCHIAL ULTRASOUND N/A 08/29/2022   Procedure: VIDEO BRONCHOSCOPY WITH ENDOBRONCHIAL ULTRASOUND;  Surgeon: Vida Rigger, MD;  Location: [facility] ORS;  Service: Thoracic;  Laterality: N/A;     FAMILY HISTORY   Family History  Problem Relation Age of Onset    Breast cancer Mother 92   Heart attack Father    Heart attack Brother      SOCIAL HISTORY   Social History   Tobacco Use   Smoking status: Never   Smokeless tobacco: Never   Tobacco comments:    former social smoker  Vaping Use   Vaping Use: Never used  Substance Use Topics   Alcohol use: No    Alcohol/week: 0.0 standard drinks of alcohol   Drug use: No     MEDICATIONS    Home Medication:    Current Medication:  Current Facility-Administered Medications:    acetaminophen (TYLENOL) tablet 650 mg, 650 mg, Oral, BID, Karna Christmas, Avriana Joo, MD, 650 mg at 09/02/22 2229   albuterol (PROVENTIL) (2.5 MG/3ML) 0.083% nebulizer solution 2.5 mg, 2.5 mg, Nebulization, Q6H PRN, Vida Rigger, MD   benzonatate (TESSALON) capsule 200 mg, 200 mg, Oral, BID PRN, Sunnie Nielsen, DO   bisacodyl (DULCOLAX) EC tablet 5 mg, 5 mg, Oral, Daily PRN, Sunnie Nielsen, DO   enoxaparin (LOVENOX) injection 55 mg,  0.5 mg/kg, Subcutaneous, Q24H, Barrie Folk, RPH, 55 mg at 09/02/22 0919   losartan (COZAAR) tablet 50 mg, 50 mg, Oral, Daily, Sunnie Nielsen, DO, 50 mg at 09/02/22 4098   montelukast (SINGULAIR) tablet 10 mg, 10 mg, Oral, Daily, Sunnie Nielsen, DO, 10 mg at 09/02/22 1191   morphine (PF) 2 MG/ML injection 2 mg, 2 mg, Intravenous, Q2H PRN, Sunnie Nielsen, DO   multivitamin with minerals tablet 1 tablet, 1 tablet, Oral, Daily, Sunnie Nielsen, DO, 1 tablet at 09/02/22 0918   ondansetron (ZOFRAN) tablet 4 mg, 4 mg, Oral, Q6H PRN **OR** ondansetron (ZOFRAN) injection 4 mg, 4 mg, Intravenous, Q6H PRN, Sunnie Nielsen, DO   oxyCODONE (Oxy IR/ROXICODONE) immediate release tablet 5 mg, 5 mg, Oral, Q4H PRN, Sunnie Nielsen, DO   pantoprazole (PROTONIX) EC tablet 40 mg, 40 mg, Oral, BID, Sunnie Nielsen, DO, 40 mg at 09/02/22 2229   senna-docusate (Senokot-S) tablet 1 tablet, 1 tablet, Oral, QHS PRN, Sunnie Nielsen, DO   traZODone (DESYREL) tablet 25 mg, 25 mg,  Oral, QHS PRN, Sunnie Nielsen, DO    ALLERGIES   Albuterol, Beclomethasone, Sulfa antibiotics, Lodine [etodolac], Latex, and Meloxicam     REVIEW OF SYSTEMS    Review of Systems:  Gen:  Denies  fever, sweats, chills weigh loss  HEENT: Denies blurred vision, double vision, ear pain, eye pain, hearing loss, nose bleeds, sore throat Cardiac:  No dizziness, chest pain or heaviness, chest tightness,edema Resp:   reports dyspnea chronically  Gi: Denies swallowing difficulty, stomach pain, nausea or vomiting, diarrhea, constipation, bowel incontinence Gu:  Denies bladder incontinence, burning urine Ext:   Denies Joint pain, stiffness or swelling Skin: Denies  skin rash, easy bruising or bleeding or hives Endoc:  Denies polyuria, polydipsia , polyphagia or weight change Psych:   Denies depression, insomnia or hallucinations   Other:  All other systems negative   VS: BP (!) 109/59   Pulse 73   Temp 98.2 F (36.8 C)   Resp 18   Ht 5\' 5"  (1.651 m)   Wt 108.9 kg   SpO2 97%   BMI 39.94 kg/m      PHYSICAL EXAM    GENERAL:NAD, no fevers, chills, no weakness no fatigue HEAD: Normocephalic, atraumatic.  EYES: Pupils equal, round, reactive to light. Extraocular muscles intact. No scleral icterus.  MOUTH: Moist mucosal membrane. Dentition intact. No abscess noted.  EAR, NOSE, THROAT: Clear without exudates. No external lesions.  NECK: Supple. No thyromegaly. No nodules. No JVD.  PULMONARY: decreased breath sounds with mild rhonchi worse at bases bilaterally.  CARDIOVASCULAR: S1 and S2. Regular rate and rhythm. No murmurs, rubs, or gallops. No edema. Pedal pulses 2+ bilaterally.  GASTROINTESTINAL: Soft, nontender, nondistended. No masses. Positive bowel sounds. No hepatosplenomegaly.  MUSCULOSKELETAL: No swelling, clubbing, or edema. Range of motion full in all extremities.  NEUROLOGIC: Cranial nerves II through XII are intact. No gross focal neurological deficits. Sensation  intact. Reflexes intact.  SKIN: No ulceration, lesions, rashes, or cyanosis. Skin warm and dry. Turgor intact.  PSYCHIATRIC: Mood, affect within normal limits. The patient is awake, alert and oriented x 3. Insight, judgment intact.       IMAGING     ASSESSMENT/PLAN   Bilateral pulmonary nodules  -s/p bronchoscopy with FNA induced left pneumothorax   Left secondary pneumothorax - chest tube management - no air leak, have clamped tube will repeat CXR in am. Patient on room air , asymptomatic  - repeat cxr this morning with non-resolved pneumothorax , I  have reinflated lung after this and have another x ray pending for today.     Thank you for allowing me to participate in the care of this patient.   Patient/Family are satisfied with care plan and all questions have been answered.    Provider disclosure: Patient with at least one acute or chronic illness or injury that poses a threat to life or bodily function and is being managed actively during this encounter.  All of the below services have been performed independently by signing provider:  review of prior documentation from internal and or external health records.  Review of previous and current lab results.  Interview and comprehensive assessment during patient visit today. Review of current and previous chest radiographs/CT scans. Discussion of management and test interpretation with health care team and patient/family.   This document was prepared using Dragon voice recognition software and may include unintentional dictation errors.     Vida Rigger, M.D.  Division of Pulmonary & Critical Care Medicine

## 2022-09-03 NOTE — Care Management Important Message (Signed)
Important Message  Patient Details  Name: Sarah Marquez MRN: [id number] Date of Birth: Mar 01, 1956   Medicare Important Message Given:  Yes     Johnell Comings 09/03/2022, 11:34 AM

## 2022-09-03 NOTE — Discharge Summary (Signed)
Physician Discharge Summary   Patient: Sarah Marquez MRN: [id number]  DOB: [birth year]   Admit:     Date of Admission: 08/29/2022 Admitted from: home   Discharge: Date of discharge: 09/03/22 Disposition: Home Condition at discharge: good  CODE STATUS: FULL CODE     Discharge Physician: Sunnie Nielsen, DO [facility]     PCP: Bosie Clos, MD  Recommendations for Outpatient Follow-up:  Follow up with PCP Bosie Clos, MD in 1-2 weeks Follow as directed w/ Dr Karna Christmas Please obtain labs/tests: consider CXR, BMP, CBC  Please follow up on the following pending results: bronchoscopy biopsy results  PCP AND OTHER OUTPATIENT PROVIDERS: SEE BELOW FOR SPECIFIC DISCHARGE INSTRUCTIONS PRINTED FOR PATIENT IN ADDITION TO GENERIC AVS PATIENT INFO     Discharge Instructions     Diet - low sodium heart healthy   Complete by: As directed    Discharge wound care:   Complete by: As directed    Keep chest tube site clean/dry. OK to shower but do not soak until fully healed   Increase activity slowly   Complete by: As directed          Discharge Diagnoses: Principal Problem:   Pneumothorax, left Active Problems:   Pneumothorax       Hospital Course: Sarah Marquez is a 67 y.o. female w/ PMH chronic moderate persistent asthma, osteoarthritis of the neck, coronary artery disease history of skin basal cell carcinoma, carpal tunnel syndrome, colonic polyps, dyspnea on exertion, GERD, gout, dyslipidemia, essential hypertension, iron deficiency, obstructive sleep apnea, vitamin D deficiency. She was seen today by Dr Karna Christmas for bronchoscopy / evaluation hypermetabolic lung nodules (to ascertain cancer vs vasculitis).  05/01: bronchoscopy was successful but complicated by L pneumothorax. Pt stable on 3L O2. IR placed pleural drain. Hospitalist consulted for admission. 05/02: CXR AM shows reexpansion L lung except small PTX L thoracic margin no more than 10%  volume. Off O2. Clamped chest tube, plan CXR tomorrow  05/03: recurrence PTX, chest tube back to suction, lung reinflated, pulmonary following, likely will keep here thru the weekend to allow resolution bronchopleural fistula, repeat CXR later today and tomorrow AM.  05/04: lung sounds present all fields, no SOB, doing well. CXR no PTX, L chest tube in place and clamped today by Dr Karna Christmas 05/05: CXR this am no concerns. Chest tube unable to be removed, d/t IR not available over the weekend in case needing replaced. Hopefully will be able to remove and discharge tomorrow. Remains clamped for now.   Consultants:  Pulmonary  IR  Procedures: 08/29/22 bronchoscopy w/ transbronchial and endobronchial biopsies for histologic evaluation, as well as therapeutic lavage/aspiration LUL mucus plugging,  08/29/22 placement L pigtail chest tube catheter       ASSESSMENT & PLAN:   L pneumothorax, recurrent Chest tube removed today pulmonary following outpatient for biopsy results s/p bronchoscopy    Lung mass uncertain etiology  Pending biopsy results --> follow outpatient    Essential HTN Cont home losartan   GERD Cont home PPI   Moderate Persistent Asthma  Albuterol prn    HLD Follow outpatient   OSA CPAP qhs               Discharge Instructions  Allergies as of 09/03/2022       Reactions   Albuterol    Nebulizer form, causes wheezing   Beclomethasone Swelling   Beconase Nose spray swelled face up Currently uses Qvar on occasion and  it does not cause problems   Sulfa Antibiotics Hives   Lodine [etodolac] Itching   Latex Rash   Adhesives, bandaids Paper tape is okay   Meloxicam Palpitations        Medication List     TAKE these medications    albuterol 108 (90 Base) MCG/ACT inhaler Commonly known as: VENTOLIN HFA SMARTSIG:2 Puff(s) Via Inhaler Every 6-8 Hours PRN   beclomethasone 80 MCG/ACT inhaler Commonly known as: QVAR Inhale 2 puffs into the lungs  2 (two) times daily as needed (shortness of breath).   benzonatate 200 MG capsule Commonly known as: TESSALON Take 200 mg by mouth 2 (two) times daily as needed.   losartan 50 MG tablet Commonly known as: COZAAR Take 1 tablet (50 mg total) by mouth daily.   montelukast 10 MG tablet Commonly known as: SINGULAIR TAKE 1 TABLET (10 MG TOTAL) BY MOUTH DAILY.   pantoprazole 40 MG tablet Commonly known as: PROTONIX Take 1 tablet (40 mg total) by mouth 2 (two) times daily.   PRESERVISION AREDS 2 PO Take 1 tablet by mouth in the morning and at bedtime.   torsemide 20 MG tablet Commonly known as: DEMADEX Take 20 mg by mouth daily. Take 1 tablet (20 mg total) by mouth once daily for 5 days   triamcinolone ointment 0.1 % Commonly known as: KENALOG Apply topically 2 (two) times daily as needed.   Vitamin D (Ergocalciferol) 1.25 MG (50000 UNIT) Caps capsule Commonly known as: DRISDOL TAKE 1 CAPSULE (50,000 UNITS TOTAL) BY MOUTH EVERY 7 (SEVEN) DAYS. (TAKING ONE TABLET PER WEEK) What changed: additional instructions               Discharge Care Instructions  (From admission, onward)           Start     Ordered   09/03/22 0000  Discharge wound care:       Comments: Keep chest tube site clean/dry. OK to shower but do not soak until fully healed   09/03/22 1142              Allergies  Allergen Reactions   Albuterol     Nebulizer form, causes wheezing   Beclomethasone Swelling    Beconase Nose spray swelled face up Currently uses Qvar on occasion and it does not cause problems   Sulfa Antibiotics Hives   Lodine [Etodolac] Itching   Latex Rash    Adhesives, bandaids Paper tape is okay   Meloxicam Palpitations     Subjective: pt denies CP/SOB, no issues w/ chest tube removal this morning, eager for discharge home    Discharge Exam: BP (!) 141/60 (BP Location: Left Arm)   Pulse 72   Temp 98.4 F (36.9 C)   Resp 20   Ht 5\' 5"  (1.651 m)   Wt 108.9 kg    SpO2 96%   BMI 39.94 kg/m  General: Pt is alert, awake, not in acute distress Cardiovascular: RRR, S1/S2 +, no rubs, no gallops Respiratory: CTA bilaterally, no wheezing, no rhonchi Abdominal: Soft, NT, ND, bowel sounds + Extremities: no edema, no cyanosis     The results of significant diagnostics from this hospitalization (including imaging, microbiology, ancillary and laboratory) are listed below for reference.     Microbiology: Recent Results (from the past 240 hour(s))  SARS CORONAVIRUS 2 (TAT 6-24 HRS) Anterior Nasal Swab     Status: None   Collection Time: 08/27/22  3:25 PM   Specimen: Anterior Nasal Swab  Result Value  Ref Range Status   SARS Coronavirus 2 NEGATIVE NEGATIVE Final    Comment: (NOTE) SARS-CoV-2 target nucleic acids are NOT DETECTED.  The SARS-CoV-2 RNA is generally detectable in upper and lower respiratory specimens during the acute phase of infection. Negative results do not preclude SARS-CoV-2 infection, do not rule out co-infections with other pathogens, and should not be used as the sole basis for treatment or other patient management decisions. Negative results must be combined with clinical observations, patient history, and epidemiological information. The expected result is Negative.  Fact Sheet for Patients: HairSlick.no  Fact Sheet for Healthcare Providers: quierodirigir.com  This test is not yet approved or cleared by the [place] FDA and  has been authorized for detection and/or diagnosis of SARS-CoV-2 by FDA under an Emergency Use Authorization (EUA). This EUA will remain  in effect (meaning this test can be used) for the duration of the COVID-19 declaration under Se ction 564(b)(1) of the Act, 21 U.S.C. section 360bbb-3(b)(1), unless the authorization is terminated or revoked sooner.  Performed at [facility], [address], [place], [place] [address]   Culture,  BAL-quantitative w Gram Stain     Status: Abnormal   Collection Time: 08/29/22  4:12 PM   Specimen: Bronchoalveolar Lavage; Respiratory  Result Value Ref Range Status   Specimen Description   Final    BRONCHIAL ALVEOLAR LAVAGE Performed at [facility], [address], [place], [place] [address]    Special Requests   Final    NONE Performed at [facility], [address] [place], [place] [address]    Gram Stain NO WBC SEEN NO ORGANISMS SEEN   Final   Culture (A)  Final    40,000 COLONIES/mL RALSTONIA PICKETTII 20,000 COLONIES/mL RALSTONIA INSIDIOSA Performed at [facility], [address] [place], [place] [address]    Report Status 09/02/2022 FINAL  Final   Organism ID, Bacteria RALSTONIA PICKETTII (A)  Final   Organism ID, Bacteria RALSTONIA INSIDIOSA (A)  Final      Susceptibility   Ralstonia pickettii - MIC*    CEFTAZIDIME 8 SENSITIVE Sensitive     CIPROFLOXACIN <=0.25 SENSITIVE Sensitive     GENTAMICIN 2 SENSITIVE Sensitive     IMIPENEM <=0.25 SENSITIVE Sensitive     TRIMETH/SULFA <=20 SENSITIVE Sensitive     PIP/TAZO 16 SENSITIVE Sensitive     * 40,000 COLONIES/mL RALSTONIA PICKETTII   Ralstonia insidiosa - MIC*    CEFTAZIDIME 16 INTERMEDIATE Intermediate     CIPROFLOXACIN <=0.25 SENSITIVE Sensitive     GENTAMICIN >=16 RESISTANT Resistant     IMIPENEM 2 SENSITIVE Sensitive     TRIMETH/SULFA <=20 SENSITIVE Sensitive     PIP/TAZO >=128 RESISTANT Resistant     * 20,000 COLONIES/mL RALSTONIA INSIDIOSA  Acid Fast Smear (AFB)     Status: None   Collection Time: 08/29/22  4:12 PM   Specimen: Bronchoalveolar Lavage; Respiratory  Result Value Ref Range Status   AFB Specimen Processing Concentration  Final   Acid Fast Smear Negative  Final    Comment: (NOTE) Performed At: [facility] [address] [place], [place] [id number] Jolene Schimke MD MV:[id number]    Source (AFB) BRONCHIAL ALVEOLAR LAVAGE  Final    Comment: Performed at  [facility], [address] [place], [place] [address]     Labs: BNP (last 3 results) No results for input(s): "BNP" in the last 8760 hours. Basic Metabolic Panel: Recent Labs  Lab 08/29/22 1741 08/30/22 0550  NA  --  138  K  --  4.1  CL  --  105  CO2  --  25  GLUCOSE  --  118*  BUN  --  25*  CREATININE 1.12* 0.92  CALCIUM  --  9.0   Liver Function Tests: No results for input(s): "AST", "ALT", "ALKPHOS", "BILITOT", "PROT", "ALBUMIN" in the last 168 hours. No results for input(s): "LIPASE", "AMYLASE" in the last 168 hours. No results for input(s): "AMMONIA" in the last 168 hours. CBC: Recent Labs  Lab 08/29/22 1741 08/30/22 0550 09/01/22 0335  WBC 6.8 9.9 7.2  HGB 13.0 11.7* 11.5*  HCT 40.4 37.1 37.1  MCV 78.9* 79.1* 81.2  PLT 194 193 172   Cardiac Enzymes: No results for input(s): "CKTOTAL", "CKMB", "CKMBINDEX", "TROPONINI" in the last 168 hours. BNP: Invalid input(s): "POCBNP" CBG: No results for input(s): "GLUCAP" in the last 168 hours. D-Dimer No results for input(s): "DDIMER" in the last 72 hours. Hgb A1c No results for input(s): "HGBA1C" in the last 72 hours. Lipid Profile No results for input(s): "CHOL", "HDL", "LDLCALC", "TRIG", "CHOLHDL", "LDLDIRECT" in the last 72 hours. Thyroid function studies No results for input(s): "TSH", "T4TOTAL", "T3FREE", "THYROIDAB" in the last 72 hours.  Invalid input(s): "FREET3" Anemia work up No results for input(s): "VITAMINB12", "FOLATE", "FERRITIN", "TIBC", "IRON", "RETICCTPCT" in the last 72 hours. Urinalysis    Component Value Date/Time   COLORURINE YELLOW (A) 06/22/2021 1019   APPEARANCEUR HAZY (A) 06/22/2021 1019   LABSPEC 1.016 06/22/2021 1019   PHURINE 6.0 06/22/2021 1019   GLUCOSEU NEGATIVE 06/22/2021 1019   HGBUR NEGATIVE 06/22/2021 1019   BILIRUBINUR NEGATIVE 06/22/2021 1019   BILIRUBINUR negative 02/12/2020 1035   KETONESUR NEGATIVE 06/22/2021 1019   PROTEINUR NEGATIVE 06/22/2021 1019    UROBILINOGEN 0.2 02/12/2020 1035   NITRITE NEGATIVE 06/22/2021 1019   LEUKOCYTESUR MODERATE (A) 06/22/2021 1019   Sepsis Labs Recent Labs  Lab 08/29/22 1741 08/30/22 0550 09/01/22 0335  WBC 6.8 9.9 7.2   Microbiology Recent Results (from the past 240 hour(s))  SARS CORONAVIRUS 2 (TAT 6-24 HRS) Anterior Nasal Swab     Status: None   Collection Time: 08/27/22  3:25 PM   Specimen: Anterior Nasal Swab  Result Value Ref Range Status   SARS Coronavirus 2 NEGATIVE NEGATIVE Final    Comment: (NOTE) SARS-CoV-2 target nucleic acids are NOT DETECTED.  The SARS-CoV-2 RNA is generally detectable in upper and lower respiratory specimens during the acute phase of infection. Negative results do not preclude SARS-CoV-2 infection, do not rule out co-infections with other pathogens, and should not be used as the sole basis for treatment or other patient management decisions. Negative results must be combined with clinical observations, patient history, and epidemiological information. The expected result is Negative.  Fact Sheet for Patients: HairSlick.no  Fact Sheet for Healthcare Providers: quierodirigir.com  This test is not yet approved or cleared by the [place] FDA and  has been authorized for detection and/or diagnosis of SARS-CoV-2 by FDA under an Emergency Use Authorization (EUA). This EUA will remain  in effect (meaning this test can be used) for the duration of the COVID-19 declaration under Se ction 564(b)(1) of the Act, 21 U.S.C. section 360bbb-3(b)(1), unless the authorization is terminated or revoked sooner.  Performed at [facility], [address], [place], [place] [address]   Culture, BAL-quantitative w Gram Stain     Status: Abnormal   Collection Time: 08/29/22  4:12 PM   Specimen: Bronchoalveolar Lavage; Respiratory  Result Value Ref Range Status   Specimen Description   Final    BRONCHIAL ALVEOLAR  LAVAGE Performed at [facility], [address] [place], [place] [address]    Special Requests   Final    NONE Performed at [facility], [address] [place], [place] [address]    Gram Stain NO WBC SEEN NO ORGANISMS SEEN   Final   Culture (A)  Final    40,000 COLONIES/mL RALSTONIA PICKETTII 20,000 COLONIES/mL RALSTONIA INSIDIOSA Performed at [facility], [address] [place], [place] [address]    Report Status 09/02/2022 FINAL  Final   Organism ID, Bacteria RALSTONIA PICKETTII (A)  Final   Organism ID, Bacteria RALSTONIA INSIDIOSA (A)  Final      Susceptibility   Ralstonia pickettii - MIC*    CEFTAZIDIME 8 SENSITIVE Sensitive     CIPROFLOXACIN <=0.25 SENSITIVE Sensitive     GENTAMICIN 2 SENSITIVE Sensitive     IMIPENEM <=0.25 SENSITIVE Sensitive     TRIMETH/SULFA <=20 SENSITIVE Sensitive     PIP/TAZO 16 SENSITIVE Sensitive     * 40,000 COLONIES/mL RALSTONIA PICKETTII   Ralstonia insidiosa - MIC*    CEFTAZIDIME 16 INTERMEDIATE Intermediate     CIPROFLOXACIN <=0.25 SENSITIVE Sensitive     GENTAMICIN >=16 RESISTANT Resistant     IMIPENEM 2 SENSITIVE Sensitive     TRIMETH/SULFA <=20 SENSITIVE Sensitive     PIP/TAZO >=128 RESISTANT Resistant     * 20,000 COLONIES/mL RALSTONIA INSIDIOSA  Acid Fast Smear (AFB)     Status: None   Collection Time: 08/29/22  4:12 PM   Specimen: Bronchoalveolar Lavage; Respiratory  Result Value Ref Range Status   AFB Specimen Processing Concentration  Final   Acid Fast Smear Negative  Final    Comment: (NOTE) Performed At: [facility] [address] [place], [place] [id number] Jolene Schimke MD EX:[id number]    Source (AFB) BRONCHIAL ALVEOLAR LAVAGE  Final    Comment: Performed at [facility], [address] [place] [place], [place] [address]   Imaging CT [facility] PLEURAL DRAIN W/INDWELL CATH W/IMG GUIDE  Result Date: 08/30/2022 INDICATION: Left pneumothorax EXAM: CT-guided left-sided chest tube  placement TECHNIQUE: Multidetector CT imaging of the chest was performed following the standard protocol with/without IV contrast. RADIATION DOSE REDUCTION: This exam was performed according to the departmental dose-optimization program which includes automated exposure control, adjustment of the mA and/or kV according to patient size and/or use of iterative reconstruction technique. MEDICATIONS: Documented in the EMR ANESTHESIA/SEDATION: Local analgesia COMPLICATIONS: None immediate. PROCEDURE: Informed written consent was obtained from the patient after a thorough discussion of the procedural risks, benefits and alternatives. All questions were addressed. Maximal Sterile Barrier Technique was utilized including caps, mask, sterile gowns, sterile gloves, sterile drape, hand hygiene and skin antiseptic. A timeout was performed prior to the initiation of the procedure. The patient was placed supine on the exam table. Limited CT of the chest was performed for planning purposes. This demonstrated large left pneumothorax. Skin entry site was marked, and the overlying skin was prepped and draped in the standard sterile fashion. Local analgesia was obtained with 1% lidocaine. Using intermittent CT fluoroscopy, a 19 gauge Yueh catheter was advanced into the left pleural space. Location was confirmed with CT and return of air. Over an Amplatz wire, the access tract was serially dilated to accommodate an 8 French locking pigtail catheter. Location was again confirmed with CT and return of air. Drainage catheter was secured to the  skin using silk suture and a dressing. It was attached to a chest tube drainage system. Patient tolerated procedure well without immediate complication. IMPRESSION: Successful CT-guided placement of an 8 French locking chest tube in the left pleural space for treatment of pneumothorax. Electronically Signed   By: Olive Bass M.D.   On: 08/30/2022 10:56   Portable chest 1 View  Result Date:  08/30/2022 CLINICAL DATA:  [id number]. Follow-up pneumothorax post pigtail chest tube placement of the left. EXAM: PORTABLE CHEST 1 VIEW COMPARISON:  Portable chest yesterday at 3:31 p.m., chest CT without contrast 08/23/2022 FINDINGS: 5:18 a.m. Interval insertion of a small caliber pigtail tube thoracostomy in the left chest base. Left lung has reinflated except for a small pneumothorax visible along the lateral left thoracic margin estimated no more than 10% volume. The mediastinum is slightly shifted to the right but less than previously. Scattered multiple bilateral coarsely nodular pulmonary opacities continue to be noted. There is patchy increased opacity in the left base which could be re-expansion edema or an area of pneumonia. No other new opacity is seen. There is mild cardiomegaly, no findings of acute CHF. Aortic tortuosity and atherosclerosis with stable mediastinum. Thoracic spondylosis. IMPRESSION: 1. Re-expansion of the left lung following pigtail chest tube placement. 2. Small residual pneumothorax along the lateral left thoracic margin estimated no more than 10% volume. The mediastinum is still shifted to the right but less than previously. 3. Patchy increased opacity in the left base could be re-expansion edema or an area of pneumonia. 4. Multiple bilateral coarsely nodular pulmonary opacities. 5. Aortic atherosclerosis. Electronically Signed   By: Almira Bar M.D.   On: 08/30/2022 06:00   DG Chest Port 1 View  Result Date: 08/29/2022 CLINICAL DATA:  Post bronchoscopy EXAM: PORTABLE CHEST 1 VIEW COMPARISON:  Portable exam 1531 hours compared to 06/20/2022 FINDINGS: Large LEFT pneumothorax with complete collapse of LEFT lung. Mediastinal shift to RIGHT. Findings are consistent tension pneumothorax. Scattered infiltrate RIGHT lung. Osseous structures unremarkable. IMPRESSION: Large LEFT tension pneumothorax with complete collapse of LEFT lung and mediastinal shift to RIGHT. Critical Value/emergent  results were called by telephone at the time of interpretation on 08/29/2022 at 3:42 pm to provider FUAD ALESKEROV , who verbally acknowledged these results. Electronically Signed   By: Ulyses Southward M.D.   On: 08/29/2022 15:43   DG C-Arm 1-60 Min-No Report  Result Date: 08/29/2022 Fluoroscopy was utilized by the requesting physician.  No radiographic interpretation.      Time coordinating discharge: over 30 minutes  SIGNED:  Sunnie Nielsen DO [facility]

## 2022-09-03 NOTE — Procedures (Addendum)
Discussed case with Dr. Juliette Alcide who recommends chest tube removal at this time. Left chest tube removed intact with no complications.   Patient tolerated procedure well, Vaseline gauze and occlusive dressing applied to exit site. Post- procedural CXR ordered.    Alex Gardener, AGNP-BC 09/03/2022, 11:01 AM

## 2022-09-07 DIAGNOSIS — J8489 Other specified interstitial pulmonary diseases: Secondary | ICD-10-CM | POA: Diagnosis not present

## 2022-09-17 ENCOUNTER — Encounter: Payer: Self-pay | Admitting: *Deleted

## 2022-09-17 DIAGNOSIS — R918 Other nonspecific abnormal finding of lung field: Secondary | ICD-10-CM

## 2022-09-17 LAB — SURGICAL PATHOLOGY

## 2022-09-17 NOTE — Progress Notes (Signed)
Per Dr. Smith Sarah, pt needs repeat PET scan and referral to thoracic surgery to consider open lung biopsy. Orders placed. Pt will be called with appts once scheduled.

## 2022-09-17 NOTE — Addendum Note (Signed)
Addended by: Glory Buff on: 09/17/2022 12:38 PM   Modules accepted: Orders

## 2022-09-20 ENCOUNTER — Ambulatory Visit
Admission: RE | Admit: 2022-09-20 | Discharge: 2022-09-20 | Disposition: A | Discharge status: Home / Self Care | Payer: PPO | Source: Ambulatory Visit | Attending: Oncology | Admitting: Oncology

## 2022-09-20 DIAGNOSIS — R918 Other nonspecific abnormal finding of lung field: Secondary | ICD-10-CM | POA: Insufficient documentation

## 2022-09-20 DIAGNOSIS — R161 Splenomegaly, not elsewhere classified: Secondary | ICD-10-CM | POA: Diagnosis not present

## 2022-09-20 DIAGNOSIS — J984 Other disorders of lung: Secondary | ICD-10-CM | POA: Insufficient documentation

## 2022-09-20 LAB — GLUCOSE, CAPILLARY: Glucose-Capillary: 81 mg/dL (ref 70–99)

## 2022-09-20 MED ORDER — FLUDEOXYGLUCOSE F - 18 (FDG) INJECTION
12.0000 | Freq: Once | INTRAVENOUS | Status: AC | PRN
  Administered 2022-09-20: 12.23 via INTRAVENOUS

## 2022-09-28 ENCOUNTER — Encounter: Payer: Self-pay | Admitting: Thoracic Surgery (Cardiothoracic Vascular Surgery)

## 2022-09-28 ENCOUNTER — Institutional Professional Consult (permissible substitution): Payer: PPO | Admitting: Thoracic Surgery (Cardiothoracic Vascular Surgery)

## 2022-09-28 VITALS — BP 135/77 | HR 86 | Resp 20 | Ht 65.0 in | Wt 245.0 lb

## 2022-09-28 DIAGNOSIS — R918 Other nonspecific abnormal finding of lung field: Secondary | ICD-10-CM | POA: Insufficient documentation

## 2022-09-28 NOTE — Progress Notes (Signed)
[address]       [place] [address]             [phone]                    [facility] Medical Record #[id number] Date of Birth: [birth year]  Referring: Creig Hines, MD Primary Care: Bosie Clos, MD Primary Cardiologist: None  Chief Complaint:    Chief Complaint  Patient presents with   Lung Mass    New patient consult, PET 5/23, Chest CT super D 4/25, chest CT 4/20, Bronch 5/1, PFTs 12/13/2015    History of Present Illness:    Sarah Marquez 67 y.o. female presents for surgical evaluation of multiple pulm nodules.  She has undergone 2 navigational colonoscopies which have been nondiagnostic.  She has also undergone a CT-guided biopsy of the lesion in the tail of her pancreas which was also nondiagnostic.  This all started back in December of last year chronic cough.  She subsequently was treated with an antibiotic course with minimal improvement.  Her coughing has been stable over the last several months.  She has had several series of cross-sectional imaging which shows some changes in the pulmonary nodules, without resolution.  She also has evidence of lymph node avidity, peritoneal mobility, and spinous avidity on the most recent PET/CT.   Zubrod Score: At the time of surgery this patient's most appropriate activity status/level should be described as: [x]     0    Normal activity, no symptoms []     1    Restricted in physical strenuous activity but ambulatory, able to do out light work []     2    Ambulatory and capable of self care, unable to do work activities, up and about               >50 % of waking hours                              []     3    Only limited self care, in bed greater than 50% of waking hours []     4    Completely disabled, no self care, confined to bed or chair []     5    Moribund   Past Medical History:  Diagnosis Date   Arthritis    neck, knees   Asthma    Calcification of right carotid artery     Cancer of the skin, basal cell    a.) s/p excision from neck, back, and foot   Carpal tunnel syndrome    Colon polyps    Difficult intubation    Dyspnea    Gastric ulcer    GERD (gastroesophageal reflux disease)    Gout    History of 2019 novel coronavirus disease (COVID-19) 10/21/2020   Hyperlipidemia    Hypertension    Iron deficiency    Iron deficiency anemia    OSA (obstructive sleep apnea)    a.) does not require nocturnal PAP therapy   Pulmonary nodule    Vertigo    last episode 12/2018   Vitamin D deficiency    Wears dentures    partial upper    Past Surgical History:  Procedure Laterality Date   ABDOMINAL HYSTERECTOMY     APPENDECTOMY     BACK SURGERY     x2 L5 FOR  RUPTURED DISC   CARDIAC CATHETERIZATION  1990's   CATARACT EXTRACTION W/PHACO Left 04/07/2019   Procedure: CATARACT EXTRACTION PHACO AND INTRAOCULAR LENS PLACEMENT (IOC) LEFT 4.43, 00:34.1;  Surgeon: Galen Manila, MD;  Location: [facility];  Service: Ophthalmology;  Laterality: Left;  Sleep Apnea-does not wear CPAP Latex-adhesives   CATARACT EXTRACTION W/PHACO Right 05/05/2019   Procedure: CATARACT EXTRACTION PHACO AND INTRAOCULAR LENS PLACEMENT (IOC) RIGHT;  4.83, 00:33.4;  Surgeon: Galen Manila, MD;  Location: [facility];  Service: Ophthalmology;  Laterality: Right;  SLEP APNEA-CPAP   CHOLECYSTECTOMY     COLONOSCOPY     2006, 2009, 2014, 2019   COLONOSCOPY WITH PROPOFOL N/A 10/16/2017   Procedure: COLONOSCOPY WITH PROPOFOL;  Surgeon: Scot Jun, MD;  Location: [facility];  Service: Endoscopy;  Laterality: N/A;   ESOPHAGOGASTRODUODENOSCOPY     2006, 2014, 2015,2019   ESOPHAGOGASTRODUODENOSCOPY (EGD) WITH PROPOFOL N/A 10/16/2017   Procedure: ESOPHAGOGASTRODUODENOSCOPY (EGD) WITH PROPOFOL;  Surgeon: Scot Jun, MD;  Location: [facility]COPY;  Service: Endoscopy;  Laterality: N/A;   EUS N/A 08/09/2022   Procedure: UPPER ENDOSCOPIC ULTRASOUND (EUS) LINEAR;   Surgeon: Bearl Mulberry, MD;  Location: [facility] ENDOSCOPY;  Service: Gastroenterology;  Laterality: N/A;   FUNCTIONAL ENDOSCOPIC SINUS SURGERY     IR RADIOLOGIST EVAL & MGMT  09/03/2022   JOINT REPLACEMENT     KNEE ARTHROSCOPY Right 11/30/2002   LUMBAR DISC SURGERY     ruptured disc x 2   TONSILLECTOMY AND ADENOIDECTOMY     TOTAL KNEE ARTHROPLASTY Right 10/25/2020   Procedure: TOTAL KNEE ARTHROPLASTY - [place] Neighbor to assist;  Surgeon: Kennedy Bucker, MD;  Location: [facility];  Service: Orthopedics;  Laterality: Right;   TOTAL KNEE ARTHROPLASTY Left 07/06/2021   Procedure: TOTAL KNEE ARTHROPLASTY;  Surgeon: Kennedy Bucker, MD;  Location: [facility];  Service: Orthopedics;  Laterality: Left;   VIDEO BRONCHOSCOPY WITH ENDOBRONCHIAL ULTRASOUND N/A 06/20/2022   Procedure: VIDEO BRONCHOSCOPY WITH ENDOBRONCHIAL ULTRASOUND;  Surgeon: Vida Rigger, MD;  Location: [facility];  Service: Thoracic;  Laterality: N/A;   VIDEO BRONCHOSCOPY WITH ENDOBRONCHIAL ULTRASOUND N/A 08/29/2022   Procedure: VIDEO BRONCHOSCOPY WITH ENDOBRONCHIAL ULTRASOUND;  Surgeon: Vida Rigger, MD;  Location: [facility];  Service: Thoracic;  Laterality: N/A;    Family History  Problem Relation Age of Onset   Breast cancer Mother 29   Heart attack Father    Heart attack Brother      Social History   Tobacco Use  Smoking Status Never  Smokeless Tobacco Never  Tobacco Comments   former social smoker    Social History   Substance and Sexual Activity  Alcohol Use No   Alcohol/week: 0.0 standard drinks of alcohol     Allergies  Allergen Reactions   Albuterol     Nebulizer form, causes wheezing   Beclomethasone Swelling    Beconase Nose spray swelled face up Currently uses Qvar on occasion and it does not cause problems   Sulfa Antibiotics Hives   Lodine [Etodolac] Itching   Latex Rash    Adhesives, bandaids Paper tape is okay   Meloxicam Palpitations    Current Outpatient Medications  Medication Sig Dispense  Refill   albuterol (VENTOLIN HFA) 108 (90 Base) MCG/ACT inhaler SMARTSIG:2 Puff(s) Via Inhaler Every 6-8 Hours PRN     beclomethasone (QVAR) 80 MCG/ACT inhaler Inhale 2 puffs into the lungs 2 (two) times daily as needed (shortness of breath).     benzonatate (TESSALON) 200 MG capsule Take 200 mg by mouth 2 (  two) times daily as needed.     losartan (COZAAR) 50 MG tablet Take 1 tablet (50 mg total) by mouth daily. 90 tablet 3   montelukast (SINGULAIR) 10 MG tablet TAKE 1 TABLET (10 MG TOTAL) BY MOUTH DAILY. 30 tablet 2   Multiple Vitamins-Minerals (PRESERVISION AREDS 2 PO) Take 1 tablet by mouth in the morning and at bedtime.     pantoprazole (PROTONIX) 40 MG tablet Take 1 tablet (40 mg total) by mouth 2 (two) times daily. 180 tablet 0   torsemide (DEMADEX) 20 MG tablet Take 20 mg by mouth daily. Take 1 tablet (20 mg total) by mouth once daily for 5 days     triamcinolone ointment (KENALOG) 0.1 % Apply topically 2 (two) times daily as needed.     Vitamin D, Ergocalciferol, (DRISDOL) 1.25 MG (50000 UNIT) CAPS capsule TAKE 1 CAPSULE (50,000 UNITS TOTAL) BY MOUTH EVERY 7 (SEVEN) DAYS. (TAKING ONE TABLET PER WEEK) (Patient taking differently: Take 50,000 Units by mouth every 7 (seven) days. Monday) 4 capsule 5   No current facility-administered medications for this visit.    Review of Systems  Constitutional:  Negative for fever, malaise/fatigue and weight loss.  Respiratory:  Positive for cough. Negative for shortness of breath.   Cardiovascular:  Negative for chest pain.  Neurological: Negative.      PHYSICAL EXAMINATION: BP 135/77 (BP Location: Left Arm, Patient Position: Sitting, Cuff Size: Large)   Pulse 86   Resp 20   Ht 5\' 5"  (1.651 m)   Wt 245 lb (111.1 kg)   SpO2 96% Comment: RA  BMI 40.77 kg/m  Physical Exam Constitutional:      General: She is not in acute distress.    Appearance: Normal appearance. She is not ill-appearing.  HENT:     Head: Normocephalic and atraumatic.   Eyes:     Extraocular Movements: Extraocular movements intact.  Cardiovascular:     Rate and Rhythm: Normal rate.  Pulmonary:     Effort: Pulmonary effort is normal. No respiratory distress.  Musculoskeletal:        General: Normal range of motion.  Skin:    General: Skin is warm and dry.  Neurological:     General: No focal deficit present.     Mental Status: She is alert and oriented to person, place, and time.     Diagnostic Studies & Laboratory data:     Recent Radiology Findings:   NM PET Image Restag (PS) Skull Base To Thigh  Result Date: 09/26/2022 CLINICAL DATA:  Subsequent treatment strategy for multiple lung masses. No oncologic treatment. EXAM: NUCLEAR MEDICINE PET SKULL BASE TO THIGH TECHNIQUE: 12.2 mCi F-18 FDG was injected intravenously. Full-ring PET imaging was performed from the skull base to thigh after the radiotracer. CT data was obtained and used for attenuation correction and anatomic localization. Fasting blood glucose: 81 mg/dl COMPARISON:  CT chest dated 08/24/2022.  PET-CT dated 05/28/2022. FINDINGS: Mediastinal blood pool activity: SUV max 2.6 Liver activity: SUV max NA NECK: No hypermetabolic cervical lymphadenopathy. Incidental CT findings: None. CHEST: Improving bilateral pulmonary nodules/masses, suggesting at least some of the prior appearance reflected infection/inflammation. However, there is a dominant 4.4 x 2.8 cm patchy/masslike opacity in the anterior left upper lobe which is new/progressive, max SUV 16.0. Additionally, there is a 14 mm nodule in the medial right middle lobe (series 4/image 63), new/progressive, max SUV 20.0. While a waxing/waning appearance could still reflect infection/inflammation, metastatic disease or pulmonary lymphoma could also have this appearance.  Bilateral perihilar hypermetabolism, max SUV 14.8 on the right and 5.4 on the left. Incidental CT findings: Very mild atherosclerotic calcifications of the aortic arch. Moderate 3  vessel coronary atherosclerosis. ABDOMEN/PELVIS: Splenomegaly.  No focal hypermetabolism. No abnormal metabolism in the liver, pancreas, or adrenal glands. Abnormal soft tissue along the left upper kidney (series 4/image 96), poorly evaluated on unenhanced CT, but with associated hypermetabolism, max SUV 7.0. Prior hypermetabolism along the pancreatic tail/splenic hilum is no longer evident. However, there is a progressive peritoneal implant measuring 18 mm along the greater curvature of the stomach (series 4/image 85), max SUV 14.0. 10 mm node/implant in the portacaval region (series 4/image 88), similar to the prior, max 3.8. Incidental CT findings: Mildly nodular hepatic contour. Status post cholecystectomy. Atherosclerotic calcifications of the abdominal aorta and branch vessels. Sigmoid diverticulosis, without evidence of diverticulitis. Status post hysterectomy. SKELETON: Hypermetabolic lesion along the T12 vertebral body, max SUV 23.5, new. Incidental CT findings: Mild degenerative changes of the visualized thoracolumbar spine. IMPRESSION: Waxing/waning bilateral pulmonary nodules/masses, as above. Otherwise, there is overall mild progression, as described above. Although unusual, this constellation of findings favors lymphoma, with widespread metastases of unknown primary considered less likely. Infection/inflammation is not considered within the differential given peritoneal disease. Some of the pulmonary lesions are progressive with hypermetabolism, as above. Associated mild bilateral perihilar nodal metastases. Suspected lesion along the superior aspect of the left kidney could reflect perirenal lymphoma. Associated splenomegaly. Upper abdominal peritoneal/nodal disease, overall progressive, although the prior lesion along the pancreatic tail is no longer evident. New osseous lesion at T12. Electronically Signed   By: Charline Bills M.D.   On: 09/26/2022 00:19   IR Radiologist Eval & Mgmt  Result  Date: 09/03/2022 INDICATION: Patient status post left chest tube placement for large left pneumothorax status post bronchoscopy 08/29/2022. Request received to remove left chest tube due to resolve of pneumothorax. EXAM: REMOVAL PERCUTANEOUS PLEURAL DRAIN CATHETER MEDICATIONS: No antibiotic was indicated for this procedure. ANESTHESIA/SEDATION: Moderate (conscious) sedation was not employed during this procedure. FLUOROSCOPY TIME:  Fluoroscopy Time: none COMPLICATIONS: None immediate. PROCEDURE: Informed verbal consent was obtained from the patient after a thorough discussion of the procedural risks, benefits and alternatives. All questions were addressed. A timeout was performed prior to the initiation of the procedure. The patient's left chest was prepped and suture was removed. Pleural catheter was clamped and cut unlocking the internal locking mechanism. The catheter was removed in it's entirety. Vaseline gauze was applied followed by a sterile dressing. The patient tolerated the procedure well with no immediate complications. IMPRESSION: Successful left pleural drain removal as described above. Read by: Alex Gardener, AGNP-BC Electronically Signed   By: Olive Bass M.D.   On: 09/03/2022 11:49   DG Chest Port 1 View  Result Date: 09/03/2022 CLINICAL DATA:  [id number] History of placement of chest tube [id number] EXAM: PORTABLE CHEST 1 VIEW COMPARISON:  None Available. FINDINGS: Interval removal of left-sided chest tube. No pneumothorax identified. Similar appearance of the cardiomediastinal silhouette. No pleural effusion. No lobar consolidation. Similar diffuse interstitial densities. No acute osseous abnormality. IMPRESSION: Interval removal of left-sided chest tube without findings of pneumothorax. Electronically Signed   By: Olive Bass M.D.   On: 09/03/2022 11:19   DG Chest Bilateral Decubitus  Result Date: 09/02/2022 CLINICAL DATA:  67 year old female with left-side chest 2 placed for pneumothorax. EXAM:  CHEST - BILATERAL DECUBITUS VIEW COMPARISON:  Portable chest 09/01/2022 and earlier. FINDINGS: Lateral decubitus views of both sides of the chest [phone]  hours. Right side down lateral decubitus view is unremarkable, no pleural edge is visible in the left lung and the left costophrenic angle is clear. Left-side-down lateral decubitus view cannot exclude trace layering pleural effusion on that side. Non dependent right lung appears clear. Mediastinal contours are stable. Visualized tracheal air column is within normal limits. Negative visible bowel gas. Stable cholecystectomy clips. IMPRESSION: 1. No pneumothorax identified on bilateral lateral decubitus views. Possible trace mobile left pleural effusion. 2. Small caliber left chest tube remains in place. Electronically Signed   By: Odessa Fleming M.D.   On: 09/02/2022 08:54   DG Chest Port 1 View  Result Date: 09/01/2022 CLINICAL DATA:  [id number] Pneumothorax [id number] EXAM: PORTABLE CHEST 1 VIEW COMPARISON:  Aug 31, 2022 FINDINGS: The cardiomediastinal silhouette is unchanged in contour.LEFT-sided chest tube. No pleural effusion. No significant pneumothorax. Similar background of reticular opacities and peribronchial cuffing. No acute pleuroparenchymal abnormality. IMPRESSION: LEFT-sided chest tube in place. No significant pneumothorax. Electronically Signed   By: Meda Klinefelter M.D.   On: 09/01/2022 19:42   DG Chest Port 1 View  Result Date: 08/31/2022 CLINICAL DATA:  Left pneumothorax. EXAM: PORTABLE CHEST 1 VIEW COMPARISON:  Aug 31, 2022. FINDINGS: Stable cardiomediastinal silhouette with mild central pulmonary vascular congestion. Left-sided chest tube is again noted without pneumothorax. Interstitial densities are noted throughout both lungs concerning for edema or atypical inflammation. Bony thorax is unremarkable. IMPRESSION: Left-sided chest tube is noted without pneumothorax. Bilateral diffuse interstitial densities are noted concerning for edema or atypical  inflammation. Electronically Signed   By: Lupita Raider M.D.   On: 08/31/2022 08:56   DG Chest Port 1 View  Result Date: 08/31/2022 CLINICAL DATA:  Left-sided pneumothorax EXAM: PORTABLE CHEST 1 VIEW COMPARISON:  Chest radiograph dated 08/30/2022 FINDINGS: Lines/tubes: Superomedial repositioning of left pleural catheter. Chest: Asymmetric near-complete atelectasis of the left lung. Persistent right interstitial and nodular opacities. Pleura: Increased large left pneumothorax.  No pleural effusion. Heart/mediastinum: Similar enlarged cardiomediastinal silhouette. Bones: No acute osseous abnormality. IMPRESSION: 1. Superomedial repositioning of left pleural catheter with increased large left pneumothorax, decreased on the subsequent radiograph. 2. Asymmetric near-complete atelectasis of the left lung. Electronically Signed   By: Agustin Cree M.D.   On: 08/31/2022 08:37   [facility] PLEURAL DRAIN W/INDWELL CATH W/IMG GUIDE  Result Date: 08/30/2022 INDICATION: Left pneumothorax EXAM: CT-guided left-sided chest tube placement TECHNIQUE: Multidetector CT imaging of the chest was performed following the standard protocol with/without IV contrast. RADIATION DOSE REDUCTION: This exam was performed according to the departmental dose-optimization program which includes automated exposure control, adjustment of the mA and/or kV according to patient size and/or use of iterative reconstruction technique. MEDICATIONS: Documented in the EMR ANESTHESIA/SEDATION: Local analgesia COMPLICATIONS: None immediate. PROCEDURE: Informed written consent was obtained from the patient after a thorough discussion of the procedural risks, benefits and alternatives. All questions were addressed. Maximal Sterile Barrier Technique was utilized including caps, mask, sterile gowns, sterile gloves, sterile drape, hand hygiene and skin antiseptic. A timeout was performed prior to the initiation of the procedure. The patient was placed supine on the  exam table. Limited CT of the chest was performed for planning purposes. This demonstrated large left pneumothorax. Skin entry site was marked, and the overlying skin was prepped and draped in the standard sterile fashion. Local analgesia was obtained with 1% lidocaine. Using intermittent CT fluoroscopy, a 19 gauge Yueh catheter was advanced into the left pleural space. Location was confirmed with CT and return of air. Over  an Amplatz wire, the access tract was serially dilated to accommodate an 8 French locking pigtail catheter. Location was again confirmed with CT and return of air. Drainage catheter was secured to the skin using silk suture and a dressing. It was attached to a chest tube drainage system. Patient tolerated procedure well without immediate complication. IMPRESSION: Successful CT-guided placement of an 8 French locking chest tube in the left pleural space for treatment of pneumothorax. Electronically Signed   By: Olive Bass M.D.   On: 08/30/2022 10:56   Portable chest 1 View  Result Date: 08/30/2022 CLINICAL DATA:  [id number]. Follow-up pneumothorax post pigtail chest tube placement of the left. EXAM: PORTABLE CHEST 1 VIEW COMPARISON:  Portable chest yesterday at 3:31 p.m., chest CT without contrast 08/23/2022 FINDINGS: 5:18 a.m. Interval insertion of a small caliber pigtail tube thoracostomy in the left chest base. Left lung has reinflated except for a small pneumothorax visible along the lateral left thoracic margin estimated no more than 10% volume. The mediastinum is slightly shifted to the right but less than previously. Scattered multiple bilateral coarsely nodular pulmonary opacities continue to be noted. There is patchy increased opacity in the left base which could be re-expansion edema or an area of pneumonia. No other new opacity is seen. There is mild cardiomegaly, no findings of acute CHF. Aortic tortuosity and atherosclerosis with stable mediastinum. Thoracic spondylosis.  IMPRESSION: 1. Re-expansion of the left lung following pigtail chest tube placement. 2. Small residual pneumothorax along the lateral left thoracic margin estimated no more than 10% volume. The mediastinum is still shifted to the right but less than previously. 3. Patchy increased opacity in the left base could be re-expansion edema or an area of pneumonia. 4. Multiple bilateral coarsely nodular pulmonary opacities. 5. Aortic atherosclerosis. Electronically Signed   By: Almira Bar M.D.   On: 08/30/2022 06:00   DG Chest Port 1 View  Result Date: 08/29/2022 CLINICAL DATA:  Post bronchoscopy EXAM: PORTABLE CHEST 1 VIEW COMPARISON:  Portable exam 1531 hours compared to 06/20/2022 FINDINGS: Large LEFT pneumothorax with complete collapse of LEFT lung. Mediastinal shift to RIGHT. Findings are consistent tension pneumothorax. Scattered infiltrate RIGHT lung. Osseous structures unremarkable. IMPRESSION: Large LEFT tension pneumothorax with complete collapse of LEFT lung and mediastinal shift to RIGHT. Critical Value/emergent results were called by telephone at the time of interpretation on 08/29/2022 at 3:42 pm to provider FUAD ALESKEROV , who verbally acknowledged these results. Electronically Signed   By: Ulyses Southward M.D.   On: 08/29/2022 15:43   DG C-Arm 1-60 Min-No Report  Result Date: 08/29/2022 Fluoroscopy was utilized by the requesting physician.  No radiographic interpretation.       I have independently reviewed the above radiology studies  and reviewed the findings with the patient.   Recent Lab Findings: Lab Results  Component Value Date   WBC 7.2 09/01/2022   HGB 11.5 (L) 09/01/2022   HCT 37.1 09/01/2022   PLT 172 09/01/2022   GLUCOSE 118 (H) 08/30/2022   CHOL 182 10/18/2021   TRIG 114 10/18/2021   HDL 44 10/18/2021   LDLCALC 117 (H) 10/18/2021   ALT 9 06/04/2022   AST 20 06/04/2022   NA 138 08/30/2022   K 4.1 08/30/2022   CL 105 08/30/2022   CREATININE 0.92 08/30/2022   BUN 25 (H)  08/30/2022   CO2 25 08/30/2022   TSH 2.060 10/18/2021   INR 1.1 06/18/2022   HGBA1C 5.5 10/18/2021     PFTs: Pending  Assessment / Plan:   67 year old female with bilateral pulmonary nodules with increased avidity on PET/CT.  There is some concern that this may be consistent with a lymphoma.  She only smoked for a few years as a teenager.  Review of her cross-sectional imaging there is a dominant mass in the left upper lobe which appears excessive via CT-guided biopsy.  If this is potential involvement in the normal core needle biopsy will be required.  I placed a referral to interventional radiology for further assessment of this.  If this is not possible, or if the results again are nondiagnostic, then we will discuss the possibility of a surgical resection  I will follow-up with her after the biopsy results.  I  spent 40 minutes with  the patient face to face in counseling and coordination of care.    Corliss Skains 09/28/2022 11:56 AM

## 2022-09-30 NOTE — Progress Notes (Signed)
Oley Balm, MD  Leodis Rains D  PROCEDURE / BIOPSY REVIEW Date: 09/28/22  Requested Biopsy site: lung  Reason for request: r/o lymphoma  Imaging review: Best seen on PETCT 09/20/22 Im 53 Se 4  Decision: Approved Imaging modality to perform: CT Schedule with: Moderate Sedation Schedule for: Any VIR  Additional comments: @VIR : consider large left pleural based mass. Cores in saline for lymphoma w/u  Please contact me with questions, concerns, or if issue pertaining to this request arise.  Dayne Oley Balm, MD [facility]

## 2022-10-03 DIAGNOSIS — R051 Acute cough: Secondary | ICD-10-CM | POA: Diagnosis not present

## 2022-10-03 DIAGNOSIS — R0602 Shortness of breath: Secondary | ICD-10-CM | POA: Diagnosis not present

## 2022-10-08 ENCOUNTER — Other Ambulatory Visit: Payer: Self-pay | Admitting: Radiology

## 2022-10-08 DIAGNOSIS — Z85828 Personal history of other malignant neoplasm of skin: Secondary | ICD-10-CM | POA: Diagnosis not present

## 2022-10-08 DIAGNOSIS — L920 Granuloma annulare: Secondary | ICD-10-CM | POA: Diagnosis not present

## 2022-10-08 DIAGNOSIS — Z08 Encounter for follow-up examination after completed treatment for malignant neoplasm: Secondary | ICD-10-CM | POA: Diagnosis not present

## 2022-10-08 DIAGNOSIS — D225 Melanocytic nevi of trunk: Secondary | ICD-10-CM | POA: Diagnosis not present

## 2022-10-08 DIAGNOSIS — D2261 Melanocytic nevi of right upper limb, including shoulder: Secondary | ICD-10-CM | POA: Diagnosis not present

## 2022-10-08 DIAGNOSIS — D2272 Melanocytic nevi of left lower limb, including hip: Secondary | ICD-10-CM | POA: Diagnosis not present

## 2022-10-08 DIAGNOSIS — R918 Other nonspecific abnormal finding of lung field: Secondary | ICD-10-CM

## 2022-10-08 DIAGNOSIS — L309 Dermatitis, unspecified: Secondary | ICD-10-CM | POA: Diagnosis not present

## 2022-10-08 DIAGNOSIS — D2262 Melanocytic nevi of left upper limb, including shoulder: Secondary | ICD-10-CM | POA: Diagnosis not present

## 2022-10-09 ENCOUNTER — Other Ambulatory Visit (HOSPITAL_COMMUNITY): Payer: Self-pay | Admitting: Interventional Radiology

## 2022-10-09 ENCOUNTER — Other Ambulatory Visit: Payer: Self-pay

## 2022-10-09 ENCOUNTER — Encounter (HOSPITAL_COMMUNITY): Payer: Self-pay

## 2022-10-09 ENCOUNTER — Inpatient Hospital Stay (HOSPITAL_COMMUNITY)
Admission: RE | Admit: 2022-10-09 | Discharge: 2022-10-10 | DRG: 201 | Disposition: A | Discharge status: Home / Self Care | Payer: PPO | Attending: Interventional Radiology | Admitting: Interventional Radiology

## 2022-10-09 DIAGNOSIS — Z8719 Personal history of other diseases of the digestive system: Secondary | ICD-10-CM

## 2022-10-09 DIAGNOSIS — J45909 Unspecified asthma, uncomplicated: Secondary | ICD-10-CM | POA: Diagnosis not present

## 2022-10-09 DIAGNOSIS — J95811 Postprocedural pneumothorax: Secondary | ICD-10-CM | POA: Diagnosis not present

## 2022-10-09 DIAGNOSIS — Z803 Family history of malignant neoplasm of breast: Secondary | ICD-10-CM

## 2022-10-09 DIAGNOSIS — Z7951 Long term (current) use of inhaled steroids: Secondary | ICD-10-CM

## 2022-10-09 DIAGNOSIS — Z79899 Other long term (current) drug therapy: Secondary | ICD-10-CM

## 2022-10-09 DIAGNOSIS — K219 Gastro-esophageal reflux disease without esophagitis: Secondary | ICD-10-CM | POA: Diagnosis present

## 2022-10-09 DIAGNOSIS — M109 Gout, unspecified: Secondary | ICD-10-CM | POA: Diagnosis present

## 2022-10-09 DIAGNOSIS — Z8249 Family history of ischemic heart disease and other diseases of the circulatory system: Secondary | ICD-10-CM

## 2022-10-09 DIAGNOSIS — E559 Vitamin D deficiency, unspecified: Secondary | ICD-10-CM | POA: Diagnosis present

## 2022-10-09 DIAGNOSIS — J181 Lobar pneumonia, unspecified organism: Secondary | ICD-10-CM | POA: Diagnosis not present

## 2022-10-09 DIAGNOSIS — Z87891 Personal history of nicotine dependence: Secondary | ICD-10-CM

## 2022-10-09 DIAGNOSIS — Z8616 Personal history of COVID-19: Secondary | ICD-10-CM | POA: Diagnosis not present

## 2022-10-09 DIAGNOSIS — R918 Other nonspecific abnormal finding of lung field: Secondary | ICD-10-CM | POA: Diagnosis present

## 2022-10-09 DIAGNOSIS — Z90710 Acquired absence of both cervix and uterus: Secondary | ICD-10-CM

## 2022-10-09 DIAGNOSIS — E785 Hyperlipidemia, unspecified: Secondary | ICD-10-CM | POA: Diagnosis not present

## 2022-10-09 DIAGNOSIS — Y848 Other medical procedures as the cause of abnormal reaction of the patient, or of later complication, without mention of misadventure at the time of the procedure: Secondary | ICD-10-CM | POA: Diagnosis present

## 2022-10-09 DIAGNOSIS — S270XXA Traumatic pneumothorax, initial encounter: Secondary | ICD-10-CM

## 2022-10-09 DIAGNOSIS — J939 Pneumothorax, unspecified: Secondary | ICD-10-CM | POA: Diagnosis not present

## 2022-10-09 DIAGNOSIS — Z96653 Presence of artificial knee joint, bilateral: Secondary | ICD-10-CM | POA: Diagnosis not present

## 2022-10-09 DIAGNOSIS — I10 Essential (primary) hypertension: Secondary | ICD-10-CM | POA: Diagnosis not present

## 2022-10-09 DIAGNOSIS — Z85828 Personal history of other malignant neoplasm of skin: Secondary | ICD-10-CM | POA: Diagnosis not present

## 2022-10-09 DIAGNOSIS — G4733 Obstructive sleep apnea (adult) (pediatric): Secondary | ICD-10-CM | POA: Diagnosis not present

## 2022-10-09 DIAGNOSIS — R911 Solitary pulmonary nodule: Secondary | ICD-10-CM | POA: Diagnosis not present

## 2022-10-09 LAB — CBC
HCT: 36.8 % (ref 36.0–46.0)
Hemoglobin: 11.8 g/dL — ABNORMAL LOW (ref 12.0–15.0)
MCH: 25.5 pg — ABNORMAL LOW (ref 26.0–34.0)
MCHC: 32.1 g/dL (ref 30.0–36.0)
MCV: 79.5 fL — ABNORMAL LOW (ref 80.0–100.0)
Platelets: 188 10*3/uL (ref 150–400)
RBC: 4.63 MIL/uL (ref 3.87–5.11)
RDW: 16.8 % — ABNORMAL HIGH (ref 11.5–15.5)
WBC: 5.6 10*3/uL (ref 4.0–10.5)
nRBC: 0 % (ref 0.0–0.2)

## 2022-10-09 LAB — PROTIME-INR
INR: 1.1 (ref 0.8–1.2)
Prothrombin Time: 14.2 seconds (ref 11.4–15.2)

## 2022-10-09 LAB — MRSA NEXT GEN BY PCR, NASAL: MRSA by PCR Next Gen: NOT DETECTED

## 2022-10-09 MED ORDER — FENTANYL CITRATE (PF) 100 MCG/2ML IJ SOLN
INTRAMUSCULAR | Status: AC | PRN
  Administered 2022-10-09 (×3): 50 ug via INTRAVENOUS

## 2022-10-09 MED ORDER — MIDAZOLAM HCL 2 MG/2ML IJ SOLN
INTRAMUSCULAR | Status: AC
  Filled 2022-10-09: qty 2

## 2022-10-09 MED ORDER — FENTANYL CITRATE (PF) 100 MCG/2ML IJ SOLN
INTRAMUSCULAR | Status: AC
  Filled 2022-10-09: qty 2

## 2022-10-09 MED ORDER — LIDOCAINE HCL 1 % IJ SOLN: 10.0000 mL | Freq: Once | INTRAMUSCULAR | Status: DC

## 2022-10-09 MED ORDER — MIDAZOLAM HCL 2 MG/2ML IJ SOLN
INTRAMUSCULAR | Status: AC | PRN
  Administered 2022-10-09 (×3): 1 mg via INTRAVENOUS

## 2022-10-09 MED ORDER — SODIUM CHLORIDE 0.9 % IV SOLN: INTRAVENOUS | Status: DC

## 2022-10-09 MED ORDER — ALPRAZOLAM 0.25 MG PO TABS
0.2500 mg | ORAL_TABLET | Freq: Three times a day (TID) | ORAL | Status: DC | PRN
  Administered 2022-10-09: 0.25 mg via ORAL
  Filled 2022-10-09: qty 1

## 2022-10-09 MED ORDER — ORAL CARE MOUTH RINSE: 15.0000 mL | OROMUCOSAL | Status: DC | PRN

## 2022-10-09 NOTE — Plan of Care (Signed)

## 2022-10-09 NOTE — H&P (Addendum)
Chief Complaint: Patient was seen in consultation today for Left pleural based mass biopsy at the request of Lightfoot,Harrell O  Referring Physician(s): Lightfoot,Harrell O  Supervising Physician: Mir, Secondary school teacher  Patient Status: [facility] - Out-pt  History of Present Illness: Sarah Marquez is a 67 y.o. female   FULL Code status per pt  Pt had cough since November 2023 PCP - antibx and Prednisone - 2 rounds since still sick Christmas Then had CXR showing lung nodules and sent to Pulm MD in [place] Referred to Dr Cliffton Asters when pancreatic lesion discovered  Peri panc LN biopsy 08/09/22:  A. LYMPH NODE, PERI PANCREATIC; FNA:  - MIXED LYMPHOID POPULATION, FAVOR REACTIVE PROCESS.  - NEGATIVE FOR METASTATIC CARCINOMA.  Bronchoscopy 08/29/22:   DIAGNOSIS:  A. LUNG, LEFT UPPER LOBE; ENB-GUIDED BIOPSY:  - ATYPICAL SUBMUCOSAL LYMPHOID PROLIFERATION SUSPICIOUS FOR B-CELL  LYMPHOMA.  - ORGANIZING PNEUMONIA, GRANULOMAS, NECROSIS, VASCULITIS, OR EPITHELIAL  MALIGNANCY ARE NOT IDENTIFIED.   Most recent PET 5/23:  IMPRESSION: Waxing/waning bilateral pulmonary nodules/masses, as above. Otherwise, there is overall mild progression, as described above. Although unusual, this constellation of findings favors lymphoma, with widespread metastases of unknown primary considered less likely. Infection/inflammation is not considered within the differential given peritoneal disease. Some of the pulmonary lesions are progressive with hypermetabolism, as above. Associated mild bilateral perihilar nodal metastases. Suspected lesion along the superior aspect of the left kidney could reflect perirenal lymphoma. Associated splenomegaly. Upper abdominal peritoneal/nodal disease, overall progressive, although the prior lesion along the pancreatic tail is no longer evident. New osseous lesion at T12.  Dr Cliffton Asters note 5/31:  Assessment / Plan:   67 year old female with bilateral pulmonary nodules with  increased avidity on PET/CT.  There is some concern that this may be consistent with a lymphoma.  She only smoked for a few years as a teenager.  Review of her cross-sectional imaging there is a dominant mass in the left upper lobe which appears excessive via CT-guided biopsy.  If this is potential involvement in the normal core needle biopsy will be required.  I placed a referral to interventional radiology for further assessment of this.  If this is not possible, or if the results again are nondiagnostic, then we will discuss the possibility of a surgical resection  Scheduled now for left pleural based mass biopsy in IR  Pt has had a rash on right leg since Feb 2024-- Biopsy with Dermatologist yesterday. She said "he thought rash may be sarcoma".   Past Medical History:  Diagnosis Date   Arthritis    neck, knees   Asthma    Calcification of right carotid artery    Cancer of the skin, basal cell    a.) s/p excision from neck, back, and foot   Carpal tunnel syndrome    Colon polyps    Difficult intubation    Dyspnea    Gastric ulcer    GERD (gastroesophageal reflux disease)    Gout    History of 2019 novel coronavirus disease (COVID-19) 10/21/2020   Hyperlipidemia    Hypertension    Iron deficiency    Iron deficiency anemia    OSA (obstructive sleep apnea)    a.) does not require nocturnal PAP therapy   Pulmonary nodule    Vertigo    last episode 12/2018   Vitamin D deficiency    Wears dentures    partial upper    Past Surgical History:  Procedure Laterality Date   ABDOMINAL HYSTERECTOMY     APPENDECTOMY  BACK SURGERY     x2 L5 FOR RUPTURED DISC   CARDIAC CATHETERIZATION  1990's   CATARACT EXTRACTION W/PHACO Left 04/07/2019   Procedure: CATARACT EXTRACTION PHACO AND INTRAOCULAR LENS PLACEMENT (IOC) LEFT 4.43, 00:34.1;  Surgeon: Galen Manila, MD;  Location: [facility];  Service: Ophthalmology;  Laterality: Left;  Sleep Apnea-does not wear  CPAP Latex-adhesives   CATARACT EXTRACTION W/PHACO Right 05/05/2019   Procedure: CATARACT EXTRACTION PHACO AND INTRAOCULAR LENS PLACEMENT (IOC) RIGHT;  4.83, 00:33.4;  Surgeon: Galen Manila, MD;  Location: [facility];  Service: Ophthalmology;  Laterality: Right;  SLEP APNEA-CPAP   CHOLECYSTECTOMY     COLONOSCOPY     2006, 2009, 2014, 2019   COLONOSCOPY WITH PROPOFOL N/A 10/16/2017   Procedure: COLONOSCOPY WITH PROPOFOL;  Surgeon: Scot Jun, MD;  Location: [facility] ENDOSCOPY;  Service: Endoscopy;  Laterality: N/A;   ESOPHAGOGASTRODUODENOSCOPY     2006, 2014, 2015,2019   ESOPHAGOGASTRODUODENOSCOPY (EGD) WITH PROPOFOL N/A 10/16/2017   Procedure: ESOPHAGOGASTRODUODENOSCOPY (EGD) WITH PROPOFOL;  Surgeon: Scot Jun, MD;  Location: [facility] ENDOSCOPY;  Service: Endoscopy;  Laterality: N/A;   EUS N/A 08/09/2022   Procedure: UPPER ENDOSCOPIC ULTRASOUND (EUS) LINEAR;  Surgeon: Bearl Mulberry, MD;  Location: [facility] END[facility]COPY;  Service: Gastroenterology;  Laterality: N/A;   FUNCTIONAL ENDOSCOPIC SINUS SURGERY     IR RADIOLOGIST EVAL & MGMT  09/03/2022   JOINT REPLACEMENT     KNEE ARTHROSCOPY Right 11/30/2002   LUMBAR DISC SURGERY     ruptured disc x 2   TONSILLECTOMY AND ADENOIDECTOMY     TOTAL KNEE ARTHROPLASTY Right 10/25/2020   Procedure: TOTAL KNEE ARTHROPLASTY - [place] Neighbor to assist;  Surgeon: Kennedy Bucker, MD;  Location: [facility] ORS;  Service: Orthopedics;  Laterality: Right;   TOTAL KNEE ARTHROPLASTY Left 07/06/2021   Procedure: TOTAL KNEE ARTHROPLASTY;  Surgeon: Kennedy Bucker, MD;  Location: [facility] ORS;  Service: Orthopedics;  Laterality: Left;   VIDEO BRONCHOSCOPY WITH ENDOBRONCHIAL ULTRASOUND N/A 06/20/2022   Procedure: VIDEO BRONCHOSCOPY WITH ENDOBRONCHIAL ULTRASOUND;  Surgeon: Vida Rigger, MD;  Location: [facility] ORS;  Service: Thoracic;  Laterality: N/A;   VIDEO BRONCHOSCOPY WITH ENDOBRONCHIAL ULTRASOUND N/A 08/29/2022   Procedure: VIDEO BRONCHOSCOPY WITH  ENDOBRONCHIAL ULTRASOUND;  Surgeon: Vida Rigger, MD;  Location: [facility] ORS;  Service: Thoracic;  Laterality: N/A;    Allergies: Albuterol, Beclomethasone, Sulfa antibiotics, Lodine [etodolac], Latex, and Meloxicam  Medications: Prior to Admission medications   Medication Sig Start Date End Date Taking? Authorizing Provider  albuterol (VENTOLIN HFA) 108 (90 Base) MCG/ACT inhaler SMARTSIG:2 Puff(s) Via Inhaler Every 6-8 Hours PRN 05/06/22  Yes [provider]  beclomethasone (QVAR) 80 MCG/ACT inhaler Inhale 2 puffs into the lungs 2 (two) times daily as needed (shortness of breath).   Yes [provider]  losartan (COZAAR) 50 MG tablet Take 1 tablet (50 mg total) by mouth daily. 01/03/22  Yes Bosie Clos, MD  montelukast (SINGULAIR) 10 MG tablet TAKE 1 TABLET (10 MG TOTAL) BY MOUTH DAILY. 02/08/16  Yes Mungal, Vishal, MD  pantoprazole (PROTONIX) 40 MG tablet Take 1 tablet (40 mg total) by mouth 2 (two) times daily. 03/19/22  Yes Simmons-Robinson, Makiera, MD  triamcinolone ointment (KENALOG) 0.1 % Apply topically 2 (two) times daily as needed. 12/19/21  Yes [provider]  Vitamin D, Ergocalciferol, (DRISDOL) 1.25 MG (50000 UNIT) CAPS capsule TAKE 1 CAPSULE (50,000 UNITS TOTAL) BY MOUTH EVERY 7 (SEVEN) DAYS. (TAKING ONE TABLET PER WEEK) Patient taking differently: Take 50,000 Units by mouth every 7 (seven) days.  Monday 11/07/20  Yes Bosie Clos, MD  benzonatate (TESSALON) 200 MG capsule Take 200 mg by mouth 2 (two) times daily as needed. 05/15/22   [provider]  Multiple Vitamins-Minerals (PRESERVISION AREDS 2 PO) Take 1 tablet by mouth in the morning and at bedtime.    [provider]  torsemide (DEMADEX) 20 MG tablet Take 20 mg by mouth daily. Take 1 tablet (20 mg total) by mouth once daily for 5 days 08/27/22   [provider]     Family History  Problem Relation Age of Onset   Breast cancer Mother 76   Heart attack Father     Heart attack Brother     Social History   Socioeconomic History   Marital status: Married    Spouse name: Ethelene Browns   Number of children: Not on file   Years of education: Not on file   Highest education level: Not on file  Occupational History   Occupation: variety of office jobs    Comment: retired  Tobacco Use   Smoking status: Never   Smokeless tobacco: Never   Tobacco comments:    former social smoker  Building services engineer Use: Never used  Substance and Sexual Activity   Alcohol use: No    Alcohol/week: 0.0 standard drinks of alcohol   Drug use: No   Sexual activity: Yes    Birth control/protection: Post-menopausal  Other Topics Concern   Not on file  Social History Narrative   Patient lives with husband and an older son.   Patient feels safe in home. Son has Muscular dystrophy and they have a ramp.   Social Determinants of Health   Financial Resource Strain: Not on file  Food Insecurity: No Food Insecurity (08/29/2022)   Hunger Vital Sign    Worried About Running Out of Food in the Last Year: Never true    Ran Out of Food in the Last Year: Never true  Transportation Needs: No Transportation Needs (08/29/2022)   PRAPARE - Administrator, Civil Service (Medical): No    Lack of Transportation (Non-Medical): No  Physical Activity: Inactive (12/20/2021)   Exercise Vital Sign    Days of Exercise per Week: 0 days    Minutes of Exercise per Session: 0 min  Stress: No Stress Concern Present (12/20/2021)   [employer] of Occupational Health - Occupational Stress Questionnaire    Feeling of Stress : Not at all  Social Connections: Socially Integrated (12/20/2021)   Social Connection and Isolation Panel [NHANES]    Frequency of Communication with Friends and Family: More than three times a week    Frequency of Social Gatherings with Friends and Family: More than three times a week    Attends Religious Services: More than 4 times per year    Active Member of  [employer] or Organizations: Yes    Attends Engineer, structural: More than 4 times per year    Marital Status: Married    Review of Systems: A 12 point ROS discussed and pertinent positives are indicated in the HPI above.  All other systems are negative.  Review of Systems  Constitutional:  Positive for activity change and fatigue. Negative for fever.  Respiratory:  Positive for cough. Negative for shortness of breath.   Gastrointestinal:  Negative for abdominal pain.  Musculoskeletal:  Negative for back pain and gait problem.  Psychiatric/Behavioral:  Negative for behavioral problems and confusion.     Vital Signs: BP Marland Kitchen)  138/54   Pulse 63   Temp 98.2 F (36.8 C) (Temporal)   Resp 16   Ht 5\' 5"  (1.651 m)   Wt 240 lb (108.9 kg)   SpO2 93%   BMI 39.94 kg/m   Advance Care Plan: The advanced care plan/surrogate decision maker was discussed at the time of visit and documented in the medical record.    Physical Exam Vitals reviewed.  HENT:     Mouth/Throat:     Mouth: Mucous membranes are moist.  Cardiovascular:     Rate and Rhythm: Normal rate and regular rhythm.     Heart sounds: Normal heart sounds.  Pulmonary:     Effort: Pulmonary effort is normal.     Breath sounds: Normal breath sounds.  Abdominal:     Palpations: Abdomen is soft.  Musculoskeletal:        General: Normal range of motion.  Skin:    General: Skin is warm.  Neurological:     Mental Status: She is alert and oriented to person, place, and time.  Psychiatric:        Behavior: Behavior normal.     Imaging: NM PET Image Restag (PS) Skull Base To Thigh  Result Date: 09/26/2022 CLINICAL DATA:  Subsequent treatment strategy for multiple lung masses. No oncologic treatment. EXAM: NUCLEAR MEDICINE PET SKULL BASE TO THIGH TECHNIQUE: 12.2 mCi F-18 FDG was injected intravenously. Full-ring PET imaging was performed from the skull base to thigh after the radiotracer. CT data was obtained and used for  attenuation correction and anatomic localization. Fasting blood glucose: 81 mg/dl COMPARISON:  CT chest dated 08/24/2022.  PET-CT dated 05/28/2022. FINDINGS: Mediastinal blood pool activity: SUV max 2.6 Liver activity: SUV max NA NECK: No hypermetabolic cervical lymphadenopathy. Incidental CT findings: None. CHEST: Improving bilateral pulmonary nodules/masses, suggesting at least some of the prior appearance reflected infection/inflammation. However, there is a dominant 4.4 x 2.8 cm patchy/masslike opacity in the anterior left upper lobe which is new/progressive, max SUV 16.0. Additionally, there is a 14 mm nodule in the medial right middle lobe (series 4/image 63), new/progressive, max SUV 20.0. While a waxing/waning appearance could still reflect infection/inflammation, metastatic disease or pulmonary lymphoma could also have this appearance. Bilateral perihilar hypermetabolism, max SUV 14.8 on the right and 5.4 on the left. Incidental CT findings: Very mild atherosclerotic calcifications of the aortic arch. Moderate 3 vessel coronary atherosclerosis. ABDOMEN/PELVIS: Splenomegaly.  No focal hypermetabolism. No abnormal metabolism in the liver, pancreas, or adrenal glands. Abnormal soft tissue along the left upper kidney (series 4/image 96), poorly evaluated on unenhanced CT, but with associated hypermetabolism, max SUV 7.0. Prior hypermetabolism along the pancreatic tail/splenic hilum is no longer evident. However, there is a progressive peritoneal implant measuring 18 mm along the greater curvature of the stomach (series 4/image 85), max SUV 14.0. 10 mm node/implant in the portacaval region (series 4/image 88), similar to the prior, max 3.8. Incidental CT findings: Mildly nodular hepatic contour. Status post cholecystectomy. Atherosclerotic calcifications of the abdominal aorta and branch vessels. Sigmoid diverticulosis, without evidence of diverticulitis. Status post hysterectomy. SKELETON: Hypermetabolic lesion  along the T12 vertebral body, max SUV 23.5, new. Incidental CT findings: Mild degenerative changes of the visualized thoracolumbar spine. IMPRESSION: Waxing/waning bilateral pulmonary nodules/masses, as above. Otherwise, there is overall mild progression, as described above. Although unusual, this constellation of findings favors lymphoma, with widespread metastases of unknown primary considered less likely. Infection/inflammation is not considered within the differential given peritoneal disease. Some of the pulmonary lesions are  progressive with hypermetabolism, as above. Associated mild bilateral perihilar nodal metastases. Suspected lesion along the superior aspect of the left kidney could reflect perirenal lymphoma. Associated splenomegaly. Upper abdominal peritoneal/nodal disease, overall progressive, although the prior lesion along the pancreatic tail is no longer evident. New osseous lesion at T12. Electronically Signed   By: Charline Bills M.D.   On: 09/26/2022 00:19    Labs:  CBC: Recent Labs    08/29/22 1741 08/30/22 0550 09/01/22 0335 10/09/22 0624  WBC 6.8 9.9 7.2 5.6  HGB 13.0 11.7* 11.5* 11.8*  HCT 40.4 37.1 37.1 36.8  PLT 194 193 172 188    COAGS: Recent Labs    06/18/22 0902 10/09/22 0624  INR 1.1 1.1  APTT 35  --     BMP: Recent Labs    10/18/21 1112 06/04/22 1436 08/29/22 1741 08/30/22 0550  NA 143 136  --  138  K 4.2 4.0  --  4.1  CL 105 103  --  105  CO2 24 25  --  25  GLUCOSE 91 95  --  118*  BUN 10 16  --  25*  CALCIUM 9.2 8.8*  --  9.0  CREATININE 0.80 1.10* 1.12* 0.92  GFRNONAA  --  55* 54* >60    LIVER FUNCTION TESTS: Recent Labs    10/18/21 1112 06/04/22 1436  BILITOT 0.9 1.1  AST 20 20  ALT 14 9  ALKPHOS 113 84  PROT 6.3 6.9  ALBUMIN 3.6* 3.1*    TUMOR MARKERS: No results for input(s): "AFPTM", "CEA", "CA199", "CHROMGRNA" in the last 8760 hours.  Assessment and Plan:  Scheduled for left pleural based lesion biopsy Risks and  benefits of CT guided lung nodule biopsy was discussed with the patient including, but not limited to bleeding, hemoptysis, respiratory failure requiring intubation, infection, pneumothorax requiring chest tube placement, stroke from air embolism or even death.  All of the patient's questions were answered and the patient is agreeable to proceed.  Consent signed and in chart.  Thank you for this interesting consult.  I greatly enjoyed meeting Sarah Marquez and look forward to participating in their care.  A copy of this report was sent to the requesting provider on this date.  Electronically Signed: Robet Leu, PA-C 10/09/2022, 7:12 AM   I spent a total of  30 Minutes   in face to face in clinical consultation, greater than 50% of which was counseling/coordinating care for left pleural based lesion bx

## 2022-10-09 NOTE — Sedation Documentation (Signed)
12 Fr. Chest tube placed immediately post lung biopsy with CT scan assistance per Dr. Bryn Gulling. See IR documentation. No issues noted. Chest tube placed in left upper chest and placed to suction after placement. Post placement CT image completed - per Dr. Bryn Gulling, placed chest tube to water seal. No issues noted post water seal trial and chest tube to remain to water seal per Dr. Bryn Gulling. No increase in O2 requirements noted during procedure. Pt. Updated and alert, denied pain. Chest tube dressed.

## 2022-10-09 NOTE — Procedures (Signed)
Interventional Radiology Procedure Note  Procedure:  CT guided left lung mass biopsy CT guided left chest tube placement  Indication:  Multiple lung masses Post biopsy left pneumothorax  Findings: Please refer to procedural dictation for full description.  Complications: Pneumothorax  EBL: < 10 mL  Acquanetta Belling, MD [phone]

## 2022-10-10 ENCOUNTER — Inpatient Hospital Stay (HOSPITAL_COMMUNITY): Payer: PPO

## 2022-10-10 MED ORDER — MONTELUKAST SODIUM 10 MG PO TABS
10.0000 mg | ORAL_TABLET | Freq: Every day | ORAL | Status: DC
  Administered 2022-10-10: 10 mg via ORAL
  Filled 2022-10-10: qty 1

## 2022-10-10 MED ORDER — ACETAMINOPHEN 500 MG PO TABS: 1000.0000 mg | ORAL_TABLET | Freq: Four times a day (QID) | ORAL | Status: DC | PRN

## 2022-10-10 MED ORDER — ALBUTEROL SULFATE (2.5 MG/3ML) 0.083% IN NEBU: 2.5000 mg | INHALATION_SOLUTION | Freq: Four times a day (QID) | RESPIRATORY_TRACT | Status: DC | PRN

## 2022-10-10 MED ORDER — PANTOPRAZOLE SODIUM 40 MG PO TBEC
40.0000 mg | DELAYED_RELEASE_TABLET | Freq: Two times a day (BID) | ORAL | Status: DC
  Administered 2022-10-10: 40 mg via ORAL
  Filled 2022-10-10: qty 1

## 2022-10-10 MED ORDER — LOSARTAN POTASSIUM 50 MG PO TABS
50.0000 mg | ORAL_TABLET | Freq: Every day | ORAL | Status: DC
  Administered 2022-10-10: 50 mg via ORAL
  Filled 2022-10-10: qty 1

## 2022-10-10 NOTE — Progress Notes (Signed)
   Discussed with Dr Milford Cage pt status CXR no ptx this am Pt doing well 97% RA No complaints No pain  Chest tube removal at 1100 am  per Dr Milford Cage order No complication Vaseline dressing placed  Upright CXR 1 hr - ordered  Plan for Dc today after see CXR

## 2022-10-10 NOTE — Discharge Summary (Signed)
Patient ID: Sarah Marquez MRN: [id number] DOB/AGE: Apr 19, 1956 67 y.o.  Admit date: 10/09/2022 Discharge date: 10/10/2022  Supervising Physician: Roanna Banning  Patient Status: [facility] - In-pt  Admission Diagnoses: left pleural based mass; post biopsy pneumothorax  Discharge Diagnoses:  Resolved pneumothorax  Discharged Condition: good  Hospital Course: pt seen in IR 6/11 for outpatient left pleural mass biopsy per Dr Joanna Puff request. Procedure performed by Dr Bryn Gulling.  Post biopsy pneumothorax requiring chest tube placement. Admitted overnight Pt did well; denies pain; SOB CXR this am revealed No PTX Discussed case and imaging with Dr Milford Cage . Removed Chest tube and placed vaseline gauze dressing. Repeat CXR 1 hour after removal - NO PTX per Dr Milford Cage. I have seen and examined pt Pt status reported to Dr Milford Cage. Planned for DC to home   Consults: None  Significant Diagnostic Studies:  Procedure:  CT guided left lung mass biopsy CT guided left chest tube placement   Indication:  Multiple lung masses Post biopsy left pneumothorax   Complications: Pneumothorax  Treatments: Procedure complicated by pneumothorax requiring left chest tube placement. Chest tube removal 10/10/22  Discharge Exam: Blood pressure 111/72, pulse 70, temperature 98 F (36.7 C), temperature source Oral, resp. rate 20, height 5\' 5"  (1.651 m), weight 242 lb 11.6 oz (110.1 kg), SpO2 94 %.  A/O Pleasant Lungs: CTA Heart: RRR Abd: soft; NT Extr: FROM  CXR: IMPRESSION: 1. No enlarging pneumothorax post LEFT chest tube removal. 2. Pulmonary hypoinflation with indistinct BILATERAL pulmonary opacities, greatest at the LEFT upper lobe, unchanged  Disposition:   Pt is ready for discharge to home Resume all home meds Follow with Dr Cliffton Asters Pathology results pending form 10/09/22 Biopsy  Pt has good understanding of DC instructions    Discharge Instructions     Call MD for:  difficulty  breathing, headache or visual disturbances   Complete by: As directed    Call MD for:  extreme fatigue   Complete by: As directed    Call MD for:  hives   Complete by: As directed    Call MD for:  persistant dizziness or light-headedness   Complete by: As directed    Call MD for:  persistant nausea and vomiting   Complete by: As directed    Call MD for:  redness, tenderness, or signs of infection (pain, swelling, redness, odor or green/yellow discharge around incision site)   Complete by: As directed    Call MD for:  severe uncontrolled pain   Complete by: As directed    Call MD for:  temperature >100.4   Complete by: As directed    Diet - low sodium heart healthy   Complete by: As directed    Discharge instructions   Complete by: As directed    Resume all home meds; restful for 24-48 hrs; if develop any chest pain or shortness of breath-- be sure to go to ED for evaluation; follow up with Dr Cliffton Asters   Discharge wound care:   Complete by: As directed    Keep vaseline dressing on chest site x 24 hrs; may shower tomorrow and place band aid on chest site after shower   Driving Restrictions   Complete by: As directed    No driving x 24 hrs   Increase activity slowly   Complete by: As directed    Lifting restrictions   Complete by: As directed    No lifting over 10 lbs x 24-48 hrs      Allergies  as of 10/10/2022       Reactions   Albuterol Other (See Comments)   Nebulizer form, causes wheezing   Beclomethasone Swelling   Beconase Nose spray swelled face up Currently uses Qvar on occasion and it does not cause problems   Sulfa Antibiotics Hives   Lodine [etodolac] Itching   Latex Rash   Adhesives, bandaids Paper tape is okay   Meloxicam Palpitations        Medication List     TAKE these medications    acetaminophen 500 MG tablet Commonly known as: TYLENOL Take 1,000 mg by mouth every 6 (six) hours as needed for moderate pain.   albuterol 108 (90 Base) MCG/ACT  inhaler Commonly known as: VENTOLIN HFA Inhale 2 puffs into the lungs every 6 (six) hours as needed for shortness of breath or wheezing.   beclomethasone 80 MCG/ACT inhaler Commonly known as: QVAR Inhale 2 puffs into the lungs 2 (two) times daily as needed (shortness of breath).   chlorpheniramine-HYDROcodone 10-8 MG/5ML Commonly known as: TUSSIONEX Take 5 mLs by mouth at bedtime as needed for cough.   losartan 50 MG tablet Commonly known as: COZAAR Take 1 tablet (50 mg total) by mouth daily.   montelukast 10 MG tablet Commonly known as: SINGULAIR TAKE 1 TABLET (10 MG TOTAL) BY MOUTH DAILY.   pantoprazole 40 MG tablet Commonly known as: PROTONIX Take 1 tablet (40 mg total) by mouth 2 (two) times daily.   predniSONE 20 MG tablet Commonly known as: DELTASONE PLEASE SEE ATTACHED FOR DETAILED DIRECTIONS   PRESERVISION AREDS 2 PO Take 1 tablet by mouth daily.   torsemide 20 MG tablet Commonly known as: DEMADEX Take 20 mg by mouth daily. Take 1 tablet (20 mg total) by mouth once daily for 5 days   triamcinolone ointment 0.1 % Commonly known as: KENALOG Apply 1 Application topically 2 (two) times daily as needed (rash).   Vitamin D (Ergocalciferol) 1.25 MG (50000 UNIT) Caps capsule Commonly known as: DRISDOL TAKE 1 CAPSULE (50,000 UNITS TOTAL) BY MOUTH EVERY 7 (SEVEN) DAYS. (TAKING ONE TABLET PER WEEK)               Discharge Care Instructions  (From admission, onward)           Start     Ordered   10/10/22 0000  Discharge wound care:       Comments: Keep vaseline dressing on chest site x 24 hrs; may shower tomorrow and place band aid on chest site after shower   10/10/22 1244              Electronically Signed: Robet Leu, PA-C 10/10/2022, 12:45 PM   I have spent Less Than 30 Minutes discharging Sarah Marquez.

## 2022-10-10 NOTE — Progress Notes (Signed)
Referring Physician(s): Lightfoot,Harrell O  Supervising Physician: Roanna Banning  Patient Status:  [facility] - In-pt  Chief Complaint:  Left pleural mass   Subjective:  Biopsy of mass 6/11 in IR Complicated by Left PTX ----chest tube placed Admitted   Pt is up in bed today Did use a Xanax last pm to help sleep No pain Breathing well  CXR this am shows NO PTX  Allergies: Albuterol, Beclomethasone, Sulfa antibiotics, Lodine [etodolac], Latex, and Meloxicam  Medications: Prior to Admission medications   Medication Sig Start Date End Date Taking? Authorizing Provider  acetaminophen (TYLENOL) 500 MG tablet Take 1,000 mg by mouth every 6 (six) hours as needed for moderate pain.   Yes [provider]  albuterol (VENTOLIN HFA) 108 (90 Base) MCG/ACT inhaler Inhale 2 puffs into the lungs every 6 (six) hours as needed for shortness of breath or wheezing. 05/06/22  Yes [provider]  beclomethasone (QVAR) 80 MCG/ACT inhaler Inhale 2 puffs into the lungs 2 (two) times daily as needed (shortness of breath).   Yes [provider]  chlorpheniramine-HYDROcodone (TUSSIONEX) 10-8 MG/5ML Take 5 mLs by mouth at bedtime as needed for cough. 09/07/22  Yes [provider]  losartan (COZAAR) 50 MG tablet Take 1 tablet (50 mg total) by mouth daily. 01/03/22  Yes Bosie Clos, MD  montelukast (SINGULAIR) 10 MG tablet TAKE 1 TABLET (10 MG TOTAL) BY MOUTH DAILY. 02/08/16  Yes Mungal, Vishal, MD  Multiple Vitamins-Minerals (PRESERVISION AREDS 2 PO) Take 1 tablet by mouth daily.   Yes [provider]  pantoprazole (PROTONIX) 40 MG tablet Take 1 tablet (40 mg total) by mouth 2 (two) times daily. 03/19/22  Yes Simmons-Robinson, Makiera, MD  triamcinolone ointment (KENALOG) 0.1 % Apply 1 Application topically 2 (two) times daily as needed (rash). 12/19/21  Yes [provider]  predniSONE (DELTASONE) 20 MG tablet PLEASE SEE ATTACHED FOR DETAILED  DIRECTIONS Patient not taking: Reported on 10/09/2022 10/01/22   [provider]  torsemide (DEMADEX) 20 MG tablet Take 20 mg by mouth daily. Take 1 tablet (20 mg total) by mouth once daily for 5 days Patient not taking: Reported on 10/09/2022 08/27/22   [provider]  Vitamin D, Ergocalciferol, (DRISDOL) 1.25 MG (50000 UNIT) CAPS capsule TAKE 1 CAPSULE (50,000 UNITS TOTAL) BY MOUTH EVERY 7 (SEVEN) DAYS. (TAKING ONE TABLET PER WEEK) Patient not taking: Reported on 10/09/2022 11/07/20   Bosie Clos, MD     Vital Signs: BP (!) 145/70 (BP Location: Left Arm)   Pulse 83   Temp 98 F (36.7 C) (Oral)   Resp (!) 23   Ht 5\' 5"  (1.651 m)   Wt 242 lb 11.6 oz (110.1 kg)   SpO2 97%   BMI 40.39 kg/m   Physical Exam Vitals reviewed.  Cardiovascular:     Rate and Rhythm: Normal rate and regular rhythm.  Pulmonary:     Effort: Pulmonary effort is normal.     Breath sounds: Normal breath sounds.  Skin:    Comments: Site of chest tube is clean and dry/NT No bleeding Pleurvac with scant blood tinged fluid in chamber No air leak  Neurological:     Mental Status: She is alert and oriented to person, place, and time.  Psychiatric:        Behavior: Behavior normal.     Imaging: DG Chest 1 View  Result Date: 10/10/2022 CLINICAL DATA:  [id number] with left pneumothorax and left chest tube, with pneumothorax caused by CT-guided  left upper lobe mass biopsy. EXAM: CHEST  1 VIEW COMPARISON:  CT biopsy and post pigtail chest catheter insertion images from yesterday at 8:34 a.m. FINDINGS: 6:22 a.m. A pigtail left chest tube again has its tip in the apical left chest. There is no visible pneumothorax. There is patchy airspace disease in the left upper lobe some of which could be pulmonary hemorrhage related to biopsy. Scattered irregular pulmonary nodules are again noted throughout both lungs, better seen with CT. There is mild cardiomegaly without CHF. The mediastinum is normally  outlined. Overall aeration is unchanged with no new abnormality. IMPRESSION: 1. No visible pneumothorax.  Grossly stable chest tube insertion 2. Patchy airspace disease in the left upper lobe some of which could be pulmonary hemorrhage related to biopsy. 3. Scattered irregular pulmonary nodules throughout both lungs, better seen with CT. Probable metastases. 4. Mild cardiomegaly without CHF. Electronically Signed   By: Almira Bar M.D.   On: 10/10/2022 07:08   CT LUNG MASS BIOPSY  Result Date: 10/09/2022 INDICATION: 67 year old woman with multiple FDG avid lung lesions presents to IR for biopsy of left upper lobe mass. EXAM: 1. CT-guided biopsy of left upper lobe lung mass 2. CT-guided left chest tube placement TECHNIQUE: Multidetector CT imaging of the chest was performed following the standard protocol without IV contrast. RADIATION DOSE REDUCTION: This exam was performed according to the departmental dose-optimization program which includes automated exposure control, adjustment of the mA and/or kV according to patient size and/or use of iterative reconstruction technique. MEDICATIONS: None. ANESTHESIA/SEDATION: Moderate (conscious) sedation was employed during this procedure. A total of Versed 3 mg and Fentanyl 150 mcg was administered intravenously by the radiology nurse. Total intra-service moderate Sedation Time: 33 minutes. The patient's level of consciousness and vital signs were monitored continuously by radiology nursing throughout the procedure under my direct supervision. COMPLICATIONS: SIR LEVEL C - Requires therapy, minor hospitalization (<48 hrs). PROCEDURE: Informed written consent was obtained from the patient after a thorough discussion of the procedural risks, benefits and alternatives. All questions were addressed. Maximal Sterile Barrier Technique was utilized including caps, mask, sterile gowns, sterile gloves, sterile drape, hand hygiene and skin antiseptic. A timeout was performed  prior to the initiation of the procedure. Patient positioned supine on the procedure table. Left anterior chest wall skin prepped and draped in the usual sterile fashion. Following local lidocaine administration, 17 gauge introducer needle was advanced into the pleural-based left upper lobe mass. 4-18 gauge cores were obtained and sent to pathology in normal saline. Postprocedure CT demonstrated moderate-sized pneumothorax. Following local lidocaine administration, the pleural space was accessed with 17 gauge introducer needle under CT guidance. Introducer needle exchanged for 12 French multipurpose pigtail drain over 0.035 inch guidewire. Chest tube attached to Pleur-Evac set at -20 cm H2O and attached to low continuous wall suction. Post placement CT scan showed appropriate positioning of the chest tube within the left pleural space. It was secured to skin with suture and covered with sterile dressing. IMPRESSION: 1. CT-guided biopsy of left upper lobe lung mass. 2. Procedure complicated by pneumothorax requiring left chest tube placement. Electronically Signed   By: Acquanetta Belling M.D.   On: 10/09/2022 15:55   CT [facility] PLEURAL DRAIN W/INDWELL CATH W/IMG GUIDE  Result Date: 10/09/2022 INDICATION: 67 year old woman with multiple FDG avid lung lesions presents to IR for biopsy of left upper lobe mass. EXAM: 1. CT-guided biopsy of left upper lobe lung mass 2. CT-guided left chest tube placement TECHNIQUE: Multidetector CT imaging of  the chest was performed following the standard protocol without IV contrast. RADIATION DOSE REDUCTION: This exam was performed according to the departmental dose-optimization program which includes automated exposure control, adjustment of the mA and/or kV according to patient size and/or use of iterative reconstruction technique. MEDICATIONS: None. ANESTHESIA/SEDATION: Moderate (conscious) sedation was employed during this procedure. A total of Versed 3 mg and Fentanyl 150 mcg was  administered intravenously by the radiology nurse. Total intra-service moderate Sedation Time: 33 minutes. The patient's level of consciousness and vital signs were monitored continuously by radiology nursing throughout the procedure under my direct supervision. COMPLICATIONS: SIR LEVEL C - Requires therapy, minor hospitalization (<48 hrs). PROCEDURE: Informed written consent was obtained from the patient after a thorough discussion of the procedural risks, benefits and alternatives. All questions were addressed. Maximal Sterile Barrier Technique was utilized including caps, mask, sterile gowns, sterile gloves, sterile drape, hand hygiene and skin antiseptic. A timeout was performed prior to the initiation of the procedure. Patient positioned supine on the procedure table. Left anterior chest wall skin prepped and draped in the usual sterile fashion. Following local lidocaine administration, 17 gauge introducer needle was advanced into the pleural-based left upper lobe mass. 4-18 gauge cores were obtained and sent to pathology in normal saline. Postprocedure CT demonstrated moderate-sized pneumothorax. Following local lidocaine administration, the pleural space was accessed with 17 gauge introducer needle under CT guidance. Introducer needle exchanged for 12 French multipurpose pigtail drain over 0.035 inch guidewire. Chest tube attached to Pleur-Evac set at -20 cm H2O and attached to low continuous wall suction. Post placement CT scan showed appropriate positioning of the chest tube within the left pleural space. It was secured to skin with suture and covered with sterile dressing. IMPRESSION: 1. CT-guided biopsy of left upper lobe lung mass. 2. Procedure complicated by pneumothorax requiring left chest tube placement. Electronically Signed   By: Acquanetta Belling M.D.   On: 10/09/2022 15:55    Labs:  CBC: Recent Labs    08/29/22 1741 08/30/22 0550 09/01/22 0335 10/09/22 0624  WBC 6.8 9.9 7.2 5.6  HGB 13.0  11.7* 11.5* 11.8*  HCT 40.4 37.1 37.1 36.8  PLT 194 193 172 188    COAGS: Recent Labs    06/18/22 0902 10/09/22 0624  INR 1.1 1.1  APTT 35  --     BMP: Recent Labs    10/18/21 1112 06/04/22 1436 08/29/22 1741 08/30/22 0550  NA 143 136  --  138  K 4.2 4.0  --  4.1  CL 105 103  --  105  CO2 24 25  --  25  GLUCOSE 91 95  --  118*  BUN 10 16  --  25*  CALCIUM 9.2 8.8*  --  9.0  CREATININE 0.80 1.10* 1.12* 0.92  GFRNONAA  --  55* 54* >60    LIVER FUNCTION TESTS: Recent Labs    10/18/21 1112 06/04/22 1436  BILITOT 0.9 1.1  AST 20 20  ALT 14 9  ALKPHOS 113 84  PROT 6.3 6.9  ALBUMIN 3.6* 3.1*    Assessment and Plan:  Will discuss case with Dr Milford Cage May be able to DC chest tube today--- DC to home   Electronically Signed: Robet Leu, PA-C 10/10/2022, 7:27 AM   I spent a total of 15 Minutes at the the patient's bedside AND on the patient's hospital floor or unit, greater than 50% of which was counseling/coordinating care for Left pleural mass- posy bx PTX

## 2022-10-12 DIAGNOSIS — R0602 Shortness of breath: Secondary | ICD-10-CM | POA: Diagnosis not present

## 2022-10-12 DIAGNOSIS — R051 Acute cough: Secondary | ICD-10-CM | POA: Diagnosis not present

## 2022-10-13 LAB — ACID FAST CULTURE WITH REFLEXED SENSITIVITIES (MYCOBACTERIA): Acid Fast Culture: NEGATIVE

## 2022-10-16 DIAGNOSIS — Z90710 Acquired absence of both cervix and uterus: Secondary | ICD-10-CM | POA: Diagnosis not present

## 2022-10-16 DIAGNOSIS — Z96651 Presence of right artificial knee joint: Secondary | ICD-10-CM | POA: Diagnosis not present

## 2022-10-16 DIAGNOSIS — I10 Essential (primary) hypertension: Secondary | ICD-10-CM | POA: Diagnosis not present

## 2022-10-16 DIAGNOSIS — Z6841 Body Mass Index (BMI) 40.0 and over, adult: Secondary | ICD-10-CM | POA: Diagnosis not present

## 2022-10-16 DIAGNOSIS — K869 Disease of pancreas, unspecified: Secondary | ICD-10-CM | POA: Diagnosis not present

## 2022-10-16 DIAGNOSIS — R918 Other nonspecific abnormal finding of lung field: Secondary | ICD-10-CM | POA: Diagnosis not present

## 2022-10-16 DIAGNOSIS — E7800 Pure hypercholesterolemia, unspecified: Secondary | ICD-10-CM | POA: Diagnosis not present

## 2022-10-16 DIAGNOSIS — S270XXA Traumatic pneumothorax, initial encounter: Secondary | ICD-10-CM | POA: Diagnosis not present

## 2022-10-16 DIAGNOSIS — G4733 Obstructive sleep apnea (adult) (pediatric): Secondary | ICD-10-CM | POA: Diagnosis not present

## 2022-10-16 DIAGNOSIS — R9389 Abnormal findings on diagnostic imaging of other specified body structures: Secondary | ICD-10-CM | POA: Diagnosis not present

## 2022-10-16 DIAGNOSIS — D509 Iron deficiency anemia, unspecified: Secondary | ICD-10-CM | POA: Diagnosis not present

## 2022-10-19 ENCOUNTER — Ambulatory Visit (INDEPENDENT_AMBULATORY_CARE_PROVIDER_SITE_OTHER): Payer: PPO | Admitting: Thoracic Surgery (Cardiothoracic Vascular Surgery)

## 2022-10-19 ENCOUNTER — Other Ambulatory Visit: Payer: Self-pay | Admitting: Thoracic Surgery (Cardiothoracic Vascular Surgery)

## 2022-10-19 DIAGNOSIS — R918 Other nonspecific abnormal finding of lung field: Secondary | ICD-10-CM

## 2022-10-19 NOTE — Progress Notes (Signed)
     [address]       [place] [address]             [phone]       Patient: Home Provider: Office Consent for Telemedicine visit obtained.  Today's visit was completed via a real-time telehealth (see specific modality noted below). The patient/authorized person provided oral consent at the time of the visit to engage in a telemedicine encounter with the present provider at [facility]. The patient/authorized person was informed of the potential benefits, limitations, and risks of telemedicine. The patient/authorized person expressed understanding that the laws that protect confidentiality also apply to telemedicine. The patient/authorized person acknowledged understanding that telemedicine does not provide emergency services and that he or she would need to call 911 or proceed to the nearest hospital for help if such a need arose.   Total time spent in the clinical discussion 10 minutes.  Telehealth Modality: Phone visit (audio only)  I had a telephone visit with Sarah Marquez.  The core biopsy was nondiagnostic, and unfortunately she had a pneumothorax post procedure.  Today, she is doing well.  I have ordered a repeat CT chest, and will see her back in 1 month to discuss the results.

## 2022-10-30 LAB — SURGICAL PATHOLOGY

## 2022-11-05 DIAGNOSIS — M310 Hypersensitivity angiitis: Secondary | ICD-10-CM | POA: Diagnosis not present

## 2022-11-19 ENCOUNTER — Ambulatory Visit
Admission: RE | Admit: 2022-11-19 | Discharge: 2022-11-19 | Disposition: A | Discharge status: Home / Self Care | Payer: PPO | Source: Ambulatory Visit | Attending: Thoracic Surgery (Cardiothoracic Vascular Surgery) | Admitting: Thoracic Surgery (Cardiothoracic Vascular Surgery)

## 2022-11-19 DIAGNOSIS — R161 Splenomegaly, not elsewhere classified: Secondary | ICD-10-CM | POA: Diagnosis not present

## 2022-11-19 DIAGNOSIS — R918 Other nonspecific abnormal finding of lung field: Secondary | ICD-10-CM | POA: Insufficient documentation

## 2022-11-23 ENCOUNTER — Ambulatory Visit (INDEPENDENT_AMBULATORY_CARE_PROVIDER_SITE_OTHER): Payer: PPO | Admitting: Thoracic Surgery (Cardiothoracic Vascular Surgery)

## 2022-11-23 DIAGNOSIS — R918 Other nonspecific abnormal finding of lung field: Secondary | ICD-10-CM

## 2022-11-23 NOTE — Progress Notes (Signed)
     [address]       [place] [address]             [phone]       Patient: Home Provider: Office Consent for Telemedicine visit obtained.  Today's visit was completed via a real-time telehealth (see specific modality noted below). The patient/authorized person provided oral consent at the time of the visit to engage in a telemedicine encounter with the present provider at [facility]. The patient/authorized person was informed of the potential benefits, limitations, and risks of telemedicine. The patient/authorized person expressed understanding that the laws that protect confidentiality also apply to telemedicine. The patient/authorized person acknowledged understanding that telemedicine does not provide emergency services and that he or she would need to call 911 or proceed to the nearest hospital for help if such a need arose.   Total time spent in the clinical discussion 10 minutes.  Telehealth Modality: Phone visit (audio only)  I had a telephone visit with Mrs. Friedlander.  No changes since her last visit.  The final report from her CT is not available, but on my read, the scan looks stable.  Given her hx of 6 nondiagnostic biopsies, she has decided to continue to watch this for now.  Her Pulmonologist agrees with this plan.  She will follow-up with [place] as needed.  Rocket Gunderson Keane Scrape

## 2022-12-05 DIAGNOSIS — G4733 Obstructive sleep apnea (adult) (pediatric): Secondary | ICD-10-CM | POA: Diagnosis not present

## 2022-12-05 DIAGNOSIS — D631 Anemia in chronic kidney disease: Secondary | ICD-10-CM | POA: Diagnosis not present

## 2022-12-05 DIAGNOSIS — N1831 Chronic kidney disease, stage 3a: Secondary | ICD-10-CM | POA: Diagnosis not present

## 2022-12-05 DIAGNOSIS — Z0000 Encounter for general adult medical examination without abnormal findings: Secondary | ICD-10-CM | POA: Diagnosis not present

## 2022-12-05 DIAGNOSIS — E7800 Pure hypercholesterolemia, unspecified: Secondary | ICD-10-CM | POA: Diagnosis not present

## 2022-12-05 DIAGNOSIS — Z6841 Body Mass Index (BMI) 40.0 and over, adult: Secondary | ICD-10-CM | POA: Diagnosis not present

## 2022-12-05 DIAGNOSIS — I10 Essential (primary) hypertension: Secondary | ICD-10-CM | POA: Diagnosis not present

## 2022-12-05 DIAGNOSIS — Z1231 Encounter for screening mammogram for malignant neoplasm of breast: Secondary | ICD-10-CM | POA: Diagnosis not present

## 2022-12-12 DIAGNOSIS — R051 Acute cough: Secondary | ICD-10-CM | POA: Diagnosis not present

## 2022-12-12 DIAGNOSIS — R0602 Shortness of breath: Secondary | ICD-10-CM | POA: Diagnosis not present

## 2022-12-21 DIAGNOSIS — U071 COVID-19: Secondary | ICD-10-CM | POA: Diagnosis not present

## 2022-12-21 DIAGNOSIS — R918 Other nonspecific abnormal finding of lung field: Secondary | ICD-10-CM | POA: Diagnosis not present

## 2022-12-21 DIAGNOSIS — J208 Acute bronchitis due to other specified organisms: Secondary | ICD-10-CM | POA: Diagnosis not present

## 2022-12-21 DIAGNOSIS — J209 Acute bronchitis, unspecified: Secondary | ICD-10-CM | POA: Diagnosis not present

## 2023-01-08 ENCOUNTER — Other Ambulatory Visit: Payer: Self-pay | Admitting: Family Medicine

## 2023-01-08 DIAGNOSIS — Z1231 Encounter for screening mammogram for malignant neoplasm of breast: Secondary | ICD-10-CM

## 2023-01-15 ENCOUNTER — Other Ambulatory Visit: Payer: Self-pay | Admitting: Thoracic Surgery (Cardiothoracic Vascular Surgery)

## 2023-01-15 DIAGNOSIS — R918 Other nonspecific abnormal finding of lung field: Secondary | ICD-10-CM

## 2023-01-29 ENCOUNTER — Ambulatory Visit
Admission: RE | Admit: 2023-01-29 | Discharge: 2023-01-29 | Disposition: A | Discharge status: Home / Self Care | Payer: PPO | Source: Ambulatory Visit | Attending: Thoracic Surgery (Cardiothoracic Vascular Surgery)

## 2023-01-29 ENCOUNTER — Other Ambulatory Visit: Payer: PPO

## 2023-01-29 DIAGNOSIS — R918 Other nonspecific abnormal finding of lung field: Secondary | ICD-10-CM | POA: Diagnosis not present

## 2023-01-30 DIAGNOSIS — H353131 Nonexudative age-related macular degeneration, bilateral, early dry stage: Secondary | ICD-10-CM | POA: Diagnosis not present

## 2023-01-30 DIAGNOSIS — H179 Unspecified corneal scar and opacity: Secondary | ICD-10-CM | POA: Diagnosis not present

## 2023-01-30 DIAGNOSIS — M3501 Sicca syndrome with keratoconjunctivitis: Secondary | ICD-10-CM | POA: Diagnosis not present

## 2023-01-30 DIAGNOSIS — H43813 Vitreous degeneration, bilateral: Secondary | ICD-10-CM | POA: Diagnosis not present

## 2023-02-01 ENCOUNTER — Encounter: Payer: Self-pay | Admitting: Thoracic Surgery (Cardiothoracic Vascular Surgery)

## 2023-02-01 ENCOUNTER — Ambulatory Visit: Payer: PPO | Admitting: Thoracic Surgery (Cardiothoracic Vascular Surgery)

## 2023-02-01 VITALS — BP 127/67 | HR 70 | Resp 20 | Ht 65.0 in | Wt 235.0 lb

## 2023-02-01 DIAGNOSIS — R918 Other nonspecific abnormal finding of lung field: Secondary | ICD-10-CM | POA: Diagnosis not present

## 2023-02-01 NOTE — Progress Notes (Signed)
[address]       [place] [address]             [phone]        Sarah Marquez [facility] Medical Record #[id number] Date of Birth: 1956-04-28  Referring: Creig Hines, MD Primary Care: Bosie Clos, MD Primary Cardiologist:None  Reason for visit:   follow-up  History of Present Illness:     67yo female presents in follow-up to discuss results from her most recent CT scan.  Her symptoms have improved since her last visit.  She remains on a steroids per her pulmonologist  Physical Exam: BP 127/67 (BP Location: Right Arm, Patient Position: Sitting, Cuff Size: Large)   Pulse 70   Resp 20   Ht 5\' 5"  (1.651 m)   Wt 235 lb (106.6 kg)   SpO2 96% Comment: RA  BMI 39.11 kg/m   Alert NAD EWOB Abdomen, ND no peripheral edema   Diagnostic Studies & Laboratory data: CT Chest:  IMPRESSION: 1. Significant interval improvement in previously demonstrated multifocal ill-defined pulmonary nodules bilaterally, most consistent with a resolving inflammatory/infectious process (supported by previous biopsy results). No new or enlarging nodules are identified. Continued CT follow-up recommended. 2. No evidence of thoracic adenopathy or pleural effusion. Mildly prominent lymph nodes in the upper abdomen are unchanged. 3. Central enlargement of the pulmonary arteries consistent with pulmonary arterial hypertension. 4. Morphologic changes of cirrhosis with splenomegaly. 5.  Aortic Atherosclerosis (ICD10-I70.0).  Assessment / Plan:   67yo female with bilateral pulmonary nodules which appear to be regressing.  She has had multiple nondiagnostic biopsies in the past.  Given the interval change, the likelihood of malignancy is much lower.  She will continue to follow-up with her pulmonologist.   Corliss Skains 02/01/2023 4:22 PM

## 2023-02-14 DIAGNOSIS — R918 Other nonspecific abnormal finding of lung field: Secondary | ICD-10-CM | POA: Diagnosis not present

## 2023-02-14 DIAGNOSIS — J410 Simple chronic bronchitis: Secondary | ICD-10-CM | POA: Diagnosis not present

## 2023-02-19 DIAGNOSIS — R0602 Shortness of breath: Secondary | ICD-10-CM | POA: Diagnosis not present

## 2023-02-19 DIAGNOSIS — J679 Hypersensitivity pneumonitis due to unspecified organic dust: Secondary | ICD-10-CM | POA: Diagnosis not present

## 2023-03-12 DIAGNOSIS — M1812 Unilateral primary osteoarthritis of first carpometacarpal joint, left hand: Secondary | ICD-10-CM | POA: Diagnosis not present

## 2023-03-12 DIAGNOSIS — M65312 Trigger thumb, left thumb: Secondary | ICD-10-CM | POA: Diagnosis not present

## 2023-03-14 ENCOUNTER — Other Ambulatory Visit: Payer: Self-pay | Admitting: Pulmonary Disease

## 2023-03-14 DIAGNOSIS — J679 Hypersensitivity pneumonitis due to unspecified organic dust: Secondary | ICD-10-CM

## 2023-03-14 DIAGNOSIS — R0602 Shortness of breath: Secondary | ICD-10-CM

## 2023-03-18 ENCOUNTER — Ambulatory Visit: Payer: PPO | Attending: Pulmonary Disease

## 2023-03-18 DIAGNOSIS — R0602 Shortness of breath: Secondary | ICD-10-CM

## 2023-03-18 DIAGNOSIS — Z87891 Personal history of nicotine dependence: Secondary | ICD-10-CM | POA: Diagnosis not present

## 2023-03-18 DIAGNOSIS — J679 Hypersensitivity pneumonitis due to unspecified organic dust: Secondary | ICD-10-CM | POA: Diagnosis not present

## 2023-03-18 LAB — PULMONARY FUNCTION TEST ARMC ONLY
FEF 25-75 Post: 3.2 L/s
FEF 25-75 Pre: 3.16 L/s
FEF2575-%Change-Post: 1 %
FEF2575-%Pred-Post: 159 %
FEF2575-%Pred-Pre: 157 %
FEV1-%Change-Post: 0 %
FEV1-%Pred-Post: 87 %
FEV1-%Pred-Pre: 88 %
FEV1-Post: 2.1 L
FEV1-Pre: 2.12 L
FEV1FVC-%Change-Post: 2 %
FEV1FVC-%Pred-Pre: 114 %
FEV6-%Change-Post: -3 %
FEV6-%Pred-Post: 77 %
FEV6-%Pred-Pre: 80 %
FEV6-Post: 2.34 L
FEV6-Pre: 2.42 L
FEV6FVC-%Pred-Post: 104 %
FEV6FVC-%Pred-Pre: 104 %
FVC-%Change-Post: -3 %
FVC-%Pred-Post: 74 %
FVC-%Pred-Pre: 76 %
FVC-Post: 2.34 L
FVC-Pre: 2.42 L
Post FEV1/FVC ratio: 90 %
Post FEV6/FVC ratio: 100 %
Pre FEV1/FVC ratio: 88 %
Pre FEV6/FVC Ratio: 100 %

## 2023-03-18 MED ORDER — METHACHOLINE 0.0625 MG/ML NEB SOLN
3.0000 mL | Freq: Once | RESPIRATORY_TRACT | Status: AC
  Administered 2023-03-18: 0.1875 mg via RESPIRATORY_TRACT
  Filled 2023-03-18: qty 3

## 2023-03-18 MED ORDER — LEVALBUTEROL HCL 0.63 MG/3ML IN NEBU
0.6300 mg | INHALATION_SOLUTION | Freq: Once | RESPIRATORY_TRACT | Status: AC
  Administered 2023-03-18: 0.63 mg via RESPIRATORY_TRACT
  Filled 2023-03-18: qty 3

## 2023-03-18 MED ORDER — METHACHOLINE 16 MG/ML NEB SOLN
3.0000 mL | Freq: Once | RESPIRATORY_TRACT | Status: AC
  Administered 2023-03-18: 48 mg via RESPIRATORY_TRACT
  Filled 2023-03-18: qty 3

## 2023-03-18 MED ORDER — METHACHOLINE 4 MG/ML NEB SOLN
3.0000 mL | Freq: Once | RESPIRATORY_TRACT | Status: AC
  Administered 2023-03-18: 12 mg via RESPIRATORY_TRACT
  Filled 2023-03-18: qty 3

## 2023-03-18 MED ORDER — METHACHOLINE 1 MG/ML NEB SOLN
3.0000 mL | Freq: Once | RESPIRATORY_TRACT | Status: AC
  Administered 2023-03-18: 3 mg via RESPIRATORY_TRACT
  Filled 2023-03-18: qty 3

## 2023-03-18 MED ORDER — METHACHOLINE 0 MG/ML NEB SOLN
3.0000 mL | Freq: Once | RESPIRATORY_TRACT | Status: AC
  Administered 2023-03-18: 3 mL via RESPIRATORY_TRACT
  Filled 2023-03-18: qty 3

## 2023-03-18 MED ORDER — ALBUTEROL SULFATE (2.5 MG/3ML) 0.083% IN NEBU
2.5000 mg | INHALATION_SOLUTION | Freq: Once | RESPIRATORY_TRACT | Status: AC
  Filled 2023-03-18: qty 3

## 2023-03-18 MED ORDER — METHACHOLINE 0.25 MG/ML NEB SOLN
3.0000 mL | Freq: Once | RESPIRATORY_TRACT | Status: AC
  Administered 2023-03-18: 0.75 mg via RESPIRATORY_TRACT
  Filled 2023-03-18: qty 3

## 2023-03-20 DIAGNOSIS — Z23 Encounter for immunization: Secondary | ICD-10-CM | POA: Diagnosis not present

## 2023-04-01 DIAGNOSIS — R051 Acute cough: Secondary | ICD-10-CM | POA: Diagnosis not present

## 2023-04-01 DIAGNOSIS — J329 Chronic sinusitis, unspecified: Secondary | ICD-10-CM | POA: Diagnosis not present

## 2023-04-01 DIAGNOSIS — J069 Acute upper respiratory infection, unspecified: Secondary | ICD-10-CM | POA: Diagnosis not present

## 2023-04-09 DIAGNOSIS — J410 Simple chronic bronchitis: Secondary | ICD-10-CM | POA: Diagnosis not present

## 2023-04-09 DIAGNOSIS — I10 Essential (primary) hypertension: Secondary | ICD-10-CM | POA: Diagnosis not present

## 2023-04-09 DIAGNOSIS — R051 Acute cough: Secondary | ICD-10-CM | POA: Diagnosis not present

## 2023-04-09 DIAGNOSIS — Z6841 Body Mass Index (BMI) 40.0 and over, adult: Secondary | ICD-10-CM | POA: Diagnosis not present

## 2023-04-09 DIAGNOSIS — E66813 Obesity, class 3: Secondary | ICD-10-CM | POA: Diagnosis not present

## 2023-04-12 DIAGNOSIS — J8489 Other specified interstitial pulmonary diseases: Secondary | ICD-10-CM | POA: Diagnosis not present

## 2023-04-18 ENCOUNTER — Ambulatory Visit
Admission: RE | Admit: 2023-04-18 | Discharge: 2023-04-18 | Disposition: A | Discharge status: Home / Self Care | Payer: PPO | Source: Ambulatory Visit | Attending: Family Medicine | Admitting: Family Medicine

## 2023-04-18 DIAGNOSIS — Z1231 Encounter for screening mammogram for malignant neoplasm of breast: Secondary | ICD-10-CM | POA: Diagnosis not present

## 2023-05-17 ENCOUNTER — Ambulatory Visit
Admission: RE | Admit: 2023-05-17 | Discharge: 2023-05-17 | Disposition: A | Discharge status: Home / Self Care | Payer: PPO | Source: Ambulatory Visit | Attending: Pulmonary Disease | Admitting: Pulmonary Disease

## 2023-05-17 ENCOUNTER — Other Ambulatory Visit: Payer: Self-pay | Admitting: Pulmonary Disease

## 2023-05-17 DIAGNOSIS — D7218 Eosinophilia in diseases classified elsewhere: Secondary | ICD-10-CM | POA: Diagnosis present

## 2023-05-17 DIAGNOSIS — M301 Polyarteritis with lung involvement [Churg-Strauss]: Secondary | ICD-10-CM | POA: Diagnosis present

## 2023-07-24 ENCOUNTER — Ambulatory Visit
Admission: RE | Admit: 2023-07-24 | Discharge: 2023-07-24 | Disposition: A | Discharge status: Home / Self Care | Source: Ambulatory Visit | Attending: Orthopedic Surgery | Admitting: Orthopedic Surgery

## 2023-07-24 ENCOUNTER — Other Ambulatory Visit: Payer: Self-pay | Admitting: Orthopedic Surgery

## 2023-07-24 DIAGNOSIS — M47816 Spondylosis without myelopathy or radiculopathy, lumbar region: Secondary | ICD-10-CM | POA: Insufficient documentation

## 2023-07-24 DIAGNOSIS — M4316 Spondylolisthesis, lumbar region: Secondary | ICD-10-CM | POA: Insufficient documentation

## 2023-08-28 ENCOUNTER — Other Ambulatory Visit: Payer: Self-pay | Admitting: Pulmonary Disease

## 2023-08-28 DIAGNOSIS — M301 Polyarteritis with lung involvement [Churg-Strauss]: Secondary | ICD-10-CM

## 2023-08-28 DIAGNOSIS — D7218 Eosinophilia in diseases classified elsewhere: Secondary | ICD-10-CM

## 2023-09-24 ENCOUNTER — Ambulatory Visit
Admission: RE | Admit: 2023-09-24 | Discharge: 2023-09-24 | Disposition: A | Discharge status: Home / Self Care | Source: Ambulatory Visit | Attending: Pulmonary Disease | Admitting: Pulmonary Disease

## 2023-09-24 DIAGNOSIS — D7218 Eosinophilia in diseases classified elsewhere: Secondary | ICD-10-CM | POA: Insufficient documentation

## 2023-09-24 DIAGNOSIS — M301 Polyarteritis with lung involvement [Churg-Strauss]: Secondary | ICD-10-CM | POA: Diagnosis present

## 2023-09-24 DIAGNOSIS — Z6841 Body Mass Index (BMI) 40.0 and over, adult: Secondary | ICD-10-CM | POA: Diagnosis present

## 2023-12-06 ENCOUNTER — Other Ambulatory Visit: Payer: Self-pay | Admitting: Family Medicine

## 2023-12-06 DIAGNOSIS — Z1231 Encounter for screening mammogram for malignant neoplasm of breast: Secondary | ICD-10-CM

## 2024-04-20 ENCOUNTER — Encounter

## 2024-04-28 ENCOUNTER — Encounter

## 2024-05-21 ENCOUNTER — Ambulatory Visit
Admission: RE | Admit: 2024-05-21 | Discharge: 2024-05-21 | Disposition: A | Discharge status: Home / Self Care | Source: Ambulatory Visit | Attending: Family Medicine | Admitting: Family Medicine

## 2024-05-21 DIAGNOSIS — Z1231 Encounter for screening mammogram for malignant neoplasm of breast: Secondary | ICD-10-CM | POA: Insufficient documentation

## 2024-05-26 ENCOUNTER — Encounter
